# Patient Record
Sex: Female | Born: 1945 | Race: White | Hispanic: No | Smoking: Former smoker
Health system: Southern US, Community
[De-identification: ages and names within clinical notes are randomized; demographics above are authoritative.]

## PROBLEM LIST (undated history)

## (undated) DIAGNOSIS — E559 Vitamin D deficiency, unspecified: Secondary | ICD-10-CM

## (undated) DIAGNOSIS — M1990 Unspecified osteoarthritis, unspecified site: Secondary | ICD-10-CM

## (undated) DIAGNOSIS — I8390 Asymptomatic varicose veins of unspecified lower extremity: Secondary | ICD-10-CM

## (undated) HISTORY — PX: HIP SURGERY: SHX245

## (undated) HISTORY — DX: Vitamin D deficiency, unspecified: E55.9

## (undated) HISTORY — PX: JOINT REPLACEMENT: SHX530

## (undated) SURGERY — HEMIARTHROPLASTY, HIP, DIRECT ANTERIOR APPROACH, FOR FRACTURE
Anesthesia: Choice | Laterality: Right

---

## 2008-08-06 ENCOUNTER — Emergency Department (HOSPITAL_BASED_OUTPATIENT_CLINIC_OR_DEPARTMENT_OTHER): Admission: EM | Admit: 2008-08-06 | Discharge: 2008-08-06 | Payer: Self-pay | Admitting: Emergency Medicine

## 2008-10-01 ENCOUNTER — Inpatient Hospital Stay (HOSPITAL_COMMUNITY): Admission: EM | Admit: 2008-10-01 | Discharge: 2008-10-12 | Payer: Self-pay | Admitting: Emergency Medicine

## 2008-10-01 ENCOUNTER — Encounter: Payer: Self-pay | Admitting: Emergency Medicine

## 2009-10-18 ENCOUNTER — Ambulatory Visit: Payer: Self-pay | Admitting: Physician Assistant

## 2009-10-18 DIAGNOSIS — L723 Sebaceous cyst: Secondary | ICD-10-CM | POA: Insufficient documentation

## 2009-10-18 DIAGNOSIS — R0989 Other specified symptoms and signs involving the circulatory and respiratory systems: Secondary | ICD-10-CM | POA: Insufficient documentation

## 2009-10-21 DIAGNOSIS — E559 Vitamin D deficiency, unspecified: Secondary | ICD-10-CM | POA: Insufficient documentation

## 2009-10-21 LAB — CONVERTED CEMR LAB
Alkaline Phosphatase: 94 units/L (ref 39–117)
Amphetamine Screen, Ur: NEGATIVE
Barbiturate Quant, Ur: NEGATIVE
Benzodiazepines.: NEGATIVE
CO2: 23 meq/L (ref 19–32)
Calcium: 9.9 mg/dL (ref 8.4–10.5)
Cocaine Metabolites: NEGATIVE
Creatinine,U: 25.6 mg/dL
Glucose, Bld: 87 mg/dL (ref 70–99)
Marijuana Metabolite: NEGATIVE
Methadone: NEGATIVE
Platelets: 273 10*3/uL (ref 150–400)
Potassium: 4.8 meq/L (ref 3.5–5.3)
Propoxyphene: NEGATIVE
Sodium: 140 meq/L (ref 135–145)
Total Bilirubin: 0.3 mg/dL (ref 0.3–1.2)
WBC: 4.7 10*3/uL (ref 4.0–10.5)

## 2009-11-29 ENCOUNTER — Ambulatory Visit: Payer: Self-pay | Admitting: Physician Assistant

## 2009-11-29 DIAGNOSIS — M2550 Pain in unspecified joint: Secondary | ICD-10-CM | POA: Insufficient documentation

## 2009-11-29 DIAGNOSIS — R799 Abnormal finding of blood chemistry, unspecified: Secondary | ICD-10-CM | POA: Insufficient documentation

## 2009-11-29 LAB — CONVERTED CEMR LAB
Glucose, Urine, Semiquant: NEGATIVE
KOH Prep: NEGATIVE
Nitrite: NEGATIVE
OCCULT 1: NEGATIVE
Pap Smear: NEGATIVE
WBC Urine, dipstick: NEGATIVE
Whiff Test: NEGATIVE

## 2009-12-03 ENCOUNTER — Encounter: Payer: Self-pay | Admitting: Physician Assistant

## 2009-12-03 LAB — CONVERTED CEMR LAB
Cholesterol, target level: 200 mg/dL
Cholesterol: 194 mg/dL (ref 0–200)
GC Probe Amp, Genital: NEGATIVE
LDL Cholesterol: 125 mg/dL — ABNORMAL HIGH (ref 0–99)
Rhuematoid fact SerPl-aCnc: <20 intl units/mL (ref 0–20)
Sed Rate: 13 mm/hr (ref 0–22)
Total CHOL/HDL Ratio: 3.7
Triglycerides: 86 mg/dL (ref <150)
VLDL: 17 mg/dL (ref 0–40)

## 2009-12-20 ENCOUNTER — Encounter: Payer: Self-pay | Admitting: Physician Assistant

## 2010-01-10 ENCOUNTER — Ambulatory Visit (HOSPITAL_COMMUNITY): Admission: RE | Admit: 2010-01-10 | Discharge: 2010-01-10 | Payer: Self-pay | Admitting: General Surgery

## 2010-02-14 ENCOUNTER — Ambulatory Visit (HOSPITAL_COMMUNITY): Admission: RE | Admit: 2010-02-14 | Discharge: 2010-02-14 | Payer: Self-pay | Admitting: General Surgery

## 2010-03-07 ENCOUNTER — Encounter: Payer: Self-pay | Admitting: Physician Assistant

## 2010-03-21 ENCOUNTER — Encounter: Payer: Self-pay | Admitting: Physician Assistant

## 2010-11-23 ENCOUNTER — Encounter: Payer: Self-pay | Admitting: Internal Medicine
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
## 2010-12-02 NOTE — Letter (Signed)
Summary: SURGEON NOTES  SURGEON NOTES   Imported By: Arta Bruce 04/09/2010 09:34:23  _____________________________________________________________________  External Attachment:    Type:   Image     Comment:   External Document

## 2010-12-02 NOTE — Letter (Signed)
Summary: pt information sheet  pt information sheet   Imported By: Arta Bruce 12/09/2009 15:44:29  _____________________________________________________________________  External Attachment:    Type:   Image     Comment:   External Document
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
## 2010-12-02 NOTE — Letter (Signed)
Summary: SLIDING SCALE FEE  SLIDING SCALE FEE   Imported By: Arta Bruce 12/09/2009 16:02:44  _____________________________________________________________________  External Attachment:    Type:   Image     Comment:   External Document

## 2010-12-02 NOTE — Letter (Signed)
Summary: SURGEON NOTES  SURGEON NOTES   Imported By: Arta Bruce 03/20/2010 15:13:08  _____________________________________________________________________  External Attachment:    Type:   Image     Comment:   External Document

## 2010-12-02 NOTE — Progress Notes (Signed)
Summary: Office Visit//DEPRESSION SCREENING  Office Visit//DEPRESSION SCREENING   Imported By: Arta Bruce 01/22/2010 12:48:43  _____________________________________________________________________  External Attachment:    Type:   Image     Comment:   External Document

## 2010-12-02 NOTE — Letter (Signed)
Summary: *HSN Results Follow up  [facility]  [address] [place], [place] [address]   Phone: [phone]  Fax: [phone]      12/03/2009   TONIANN DICKERSON [address] [place], [place]  [address]   Dear  Ms. Gaytha Godek,                            ____S.Drinkard,FNP   ____D. Gore,FNP       ____B. McPherson,MD   ____V. Rankins,MD    ____E. Mulberry,MD    ____N. Daphine Deutscher, FNP  ____D. Reche Dixon, MD    ____K. Philipp Deputy, MD    __x__S. Alben Spittle, PA-C    This letter is to inform you that your recent test(s):  __x_____Pap Smear    ___x____Lab Test     _______X-ray   ___x____ is within acceptable limits  _______ requires a medication change  _______ requires a follow-up lab visit  _______ requires a follow-up visit with your provider   Comments:  No signs of rheumatoid arthritis.  Your cholesterol levels are ok.  But, I need you to get the ultrasound done on your neck.  If it does show some plaque in your artery, then we would want to be more aggressive with your cholesterol to get it lower.  We would put you on a medication to help in order to reduce your risk of stroke.  If the ultrasound looks good, then there is nothing else we need to do.  If you don't already, start taking a baby aspirin (81 mg) daily (unless you cannot take it due to allergy or history of bleeding ulcers, etc.).     _________________________________________________________ If you have any questions, please contact our office                     Sincerely,  Tereso Newcomer PA-C [facility]

## 2011-01-20 LAB — CBC
Platelets: 210 10*3/uL (ref 150–400)
RBC: 3.62 MIL/uL — ABNORMAL LOW (ref 3.87–5.11)
WBC: 3.9 10*3/uL — ABNORMAL LOW (ref 4.0–10.5)

## 2011-02-23 ENCOUNTER — Emergency Department (HOSPITAL_COMMUNITY)
Admission: EM | Admit: 2011-02-23 | Discharge: 2011-02-23 | Disposition: A | Discharge status: Home / Self Care | Payer: Self-pay | Attending: Emergency Medicine | Admitting: Emergency Medicine

## 2011-02-23 DIAGNOSIS — IMO0002 Reserved for concepts with insufficient information to code with codable children: Secondary | ICD-10-CM | POA: Insufficient documentation

## 2011-02-23 DIAGNOSIS — T169XXA Foreign body in ear, unspecified ear, initial encounter: Secondary | ICD-10-CM | POA: Insufficient documentation
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
## 2011-08-04 LAB — CBC
MCHC: 32.2
MCV: 105.3 — ABNORMAL HIGH
RBC: 3.8 — ABNORMAL LOW
RDW: 13.9

## 2011-08-04 LAB — BASIC METABOLIC PANEL
Calcium: 9
Chloride: 94 — ABNORMAL LOW
Creatinine, Ser: 0.5
GFR calc Af Amer: >60
Glucose, Bld: 102 — ABNORMAL HIGH
Potassium: 3.8
Sodium: 131 — ABNORMAL LOW

## 2011-08-04 LAB — DIFFERENTIAL
Basophils Absolute: 0
Eosinophils Relative: 4
Lymphocytes Relative: 26
Lymphs Abs: 1.3
Monocytes Absolute: 0.5
Neutro Abs: 3

## 2011-08-04 LAB — URINE CULTURE: Colony Count: NO GROWTH

## 2011-08-04 LAB — URINALYSIS, ROUTINE W REFLEX MICROSCOPIC
Bilirubin Urine: NEGATIVE
Glucose, UA: NEGATIVE
Ketones, ur: NEGATIVE
Specific Gravity, Urine: 1.017
pH: 5.5

## 2011-08-07 LAB — PROTIME-INR
INR: 1.6 — ABNORMAL HIGH (ref 0.00–1.49)
INR: 2 — ABNORMAL HIGH (ref 0.00–1.49)
INR: 2.4 — ABNORMAL HIGH (ref 0.00–1.49)
INR: 3 — ABNORMAL HIGH (ref 0.00–1.49)
Prothrombin Time: 16 seconds — ABNORMAL HIGH (ref 11.6–15.2)
Prothrombin Time: 20.1 seconds — ABNORMAL HIGH (ref 11.6–15.2)
Prothrombin Time: 23.6 seconds — ABNORMAL HIGH (ref 11.6–15.2)
Prothrombin Time: 33.5 seconds — ABNORMAL HIGH (ref 11.6–15.2)

## 2011-08-07 LAB — CBC
HCT: 27.2 % — ABNORMAL LOW (ref 36.0–46.0)
HCT: 29.5 % — ABNORMAL LOW (ref 36.0–46.0)
HCT: 34.1 % — ABNORMAL LOW (ref 36.0–46.0)
Hemoglobin: 10.1 g/dL — ABNORMAL LOW (ref 12.0–15.0)
Hemoglobin: 11.9 g/dL — ABNORMAL LOW (ref 12.0–15.0)
Hemoglobin: 13.7 g/dL (ref 12.0–15.0)
Hemoglobin: 9.1 g/dL — ABNORMAL LOW (ref 12.0–15.0)
MCHC: 34.2 g/dL (ref 30.0–36.0)
MCHC: 34.3 g/dL (ref 30.0–36.0)
MCHC: 34.8 g/dL (ref 30.0–36.0)
MCHC: 35 g/dL (ref 30.0–36.0)
MCV: 106.3 fL — ABNORMAL HIGH (ref 78.0–100.0)
MCV: 106.8 fL — ABNORMAL HIGH (ref 78.0–100.0)
MCV: 107.1 fL — ABNORMAL HIGH (ref 78.0–100.0)
MCV: 107.5 fL — ABNORMAL HIGH (ref 78.0–100.0)
Platelets: 152 10*3/uL (ref 150–400)
RBC: 2.76 MIL/uL — ABNORMAL LOW (ref 3.87–5.11)
RBC: 3.76 MIL/uL — ABNORMAL LOW (ref 3.87–5.11)
RDW: 14.6 % (ref 11.5–15.5)
RDW: 14.8 % (ref 11.5–15.5)
WBC: 7.3 10*3/uL (ref 4.0–10.5)
WBC: 9.6 10*3/uL (ref 4.0–10.5)

## 2011-08-07 LAB — BASIC METABOLIC PANEL
BUN: 4 mg/dL — ABNORMAL LOW (ref 6–23)
BUN: 6 mg/dL (ref 6–23)
CO2: 28 mEq/L (ref 19–32)
CO2: 30 mEq/L (ref 19–32)
Calcium: 8.3 mg/dL — ABNORMAL LOW (ref 8.4–10.5)
Chloride: 97 mEq/L (ref 96–112)
Creatinine, Ser: 0.32 mg/dL — ABNORMAL LOW (ref 0.4–1.2)
Creatinine, Ser: 0.42 mg/dL (ref 0.4–1.2)
GFR calc non Af Amer: >60 mL/min (ref >60)
GFR calc non Af Amer: >60 mL/min (ref >60)
Glucose, Bld: 107 mg/dL — ABNORMAL HIGH (ref 70–99)
Potassium: 3.6 mEq/L (ref 3.5–5.1)
Sodium: 133 mEq/L — ABNORMAL LOW (ref 135–145)
Sodium: 137 mEq/L (ref 135–145)

## 2012-01-23 ENCOUNTER — Encounter (HOSPITAL_BASED_OUTPATIENT_CLINIC_OR_DEPARTMENT_OTHER): Payer: Self-pay | Admitting: *Deleted

## 2012-01-23 ENCOUNTER — Emergency Department (HOSPITAL_BASED_OUTPATIENT_CLINIC_OR_DEPARTMENT_OTHER)
Admission: EM | Admit: 2012-01-23 | Discharge: 2012-01-23 | Disposition: A | Discharge status: Home / Self Care | Payer: Self-pay | Attending: Emergency Medicine | Admitting: Emergency Medicine

## 2012-01-23 ENCOUNTER — Emergency Department (INDEPENDENT_AMBULATORY_CARE_PROVIDER_SITE_OTHER): Payer: Self-pay

## 2012-01-23 DIAGNOSIS — M19079 Primary osteoarthritis, unspecified ankle and foot: Secondary | ICD-10-CM | POA: Insufficient documentation

## 2012-01-23 DIAGNOSIS — M25569 Pain in unspecified knee: Secondary | ICD-10-CM

## 2012-01-23 DIAGNOSIS — M1990 Unspecified osteoarthritis, unspecified site: Secondary | ICD-10-CM

## 2012-01-23 DIAGNOSIS — M25473 Effusion, unspecified ankle: Secondary | ICD-10-CM | POA: Insufficient documentation

## 2012-01-23 DIAGNOSIS — M25476 Effusion, unspecified foot: Secondary | ICD-10-CM | POA: Insufficient documentation

## 2012-01-23 DIAGNOSIS — R609 Edema, unspecified: Secondary | ICD-10-CM | POA: Insufficient documentation

## 2012-01-23 DIAGNOSIS — M7989 Other specified soft tissue disorders: Secondary | ICD-10-CM | POA: Insufficient documentation

## 2012-01-23 MED ORDER — OXYCODONE-ACETAMINOPHEN 5-325 MG PO TABS: 2.0000 | ORAL_TABLET | ORAL | Status: AC | PRN

## 2012-01-23 MED ORDER — NAPROXEN 500 MG PO TABS: 500.0000 mg | ORAL_TABLET | Freq: Two times a day (BID) | ORAL | Status: AC
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
## 2012-01-23 NOTE — Discharge Instructions (Signed)
Degenerative Arthritis  You have osteoarthritis. This is the wear and tear arthritis that comes with aging. It is also called degenerative arthritis. This is common in people past middle age. It is caused by stress on the joints. The large weight bearing joints of the lower extremities are most often affected. The knees, hips, back, neck, and hands can become painful, swollen, and stiff. This is the most common type of arthritis. It comes on with age, carrying too much weight, or from an injury.  Treatment includes resting the sore joint until the pain and swelling improve. Crutches or a walker may be needed for severe flares. Only take over-the-counter or prescription medicines for pain, discomfort, or fever as directed by your caregiver. Local heat therapy may improve motion. Cortisone shots into the joint are sometimes used to reduce pain and swelling during flares.  Osteoarthritis is usually not crippling and progresses slowly. There are things you can do to decrease pain:  · Avoid high impact activities.  · Exercise regularly.  · Low impact exercises such as walking, biking and swimming help to keep the muscles strong and keep normal joint function.  · Stretching helps to keep your range of motion.  · Lose weight if you are overweight. This reduces joint stress.  In severe cases when you have pain at rest or increasing disability, joint surgery may be helpful. See your caregiver for follow-up treatment as recommended.   SEEK IMMEDIATE MEDICAL CARE IF:   · You have severe joint pain.  · Marked swelling and redness in your joint develops.  · You develop a high fever.  Document Released: 10/19/2005 Document Revised: 10/08/2011 Document Reviewed: 03/21/2007  [employer]® Patient Information ©2012 [employer].

## 2012-01-23 NOTE — ED Notes (Signed)
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 Walking with walker. No swelling per pt. No known injury.

## 2013-11-17 ENCOUNTER — Encounter (HOSPITAL_COMMUNITY): Payer: Self-pay | Admitting: Emergency Medicine

## 2013-11-17 ENCOUNTER — Emergency Department (HOSPITAL_COMMUNITY)
Admission: EM | Admit: 2013-11-17 | Discharge: 2013-11-17 | Disposition: A | Discharge status: Home / Self Care | Payer: Medicare Other | Attending: Emergency Medicine | Admitting: Emergency Medicine

## 2013-11-17 DIAGNOSIS — Z8739 Personal history of other diseases of the musculoskeletal system and connective tissue: Secondary | ICD-10-CM | POA: Insufficient documentation

## 2013-11-17 DIAGNOSIS — F17200 Nicotine dependence, unspecified, uncomplicated: Secondary | ICD-10-CM | POA: Insufficient documentation

## 2013-11-17 DIAGNOSIS — I83899 Varicose veins of unspecified lower extremities with other complications: Secondary | ICD-10-CM

## 2013-11-17 DIAGNOSIS — I8390 Asymptomatic varicose veins of unspecified lower extremity: Secondary | ICD-10-CM | POA: Insufficient documentation

## 2013-11-17 HISTORY — DX: Unspecified osteoarthritis, unspecified site: M19.90

## 2013-11-17 HISTORY — DX: Asymptomatic varicose veins of unspecified lower extremity: I83.90

## 2013-11-17 NOTE — Discharge Instructions (Signed)
Keep your wound clean and dry.  If you have any more bleeding, elevate your leg and hold pressure for 15-59min.      Bleeding Varicose Veins Varicose veins are veins that have become enlarged and twisted. Valves in the veins help return blood from the leg to the heart. If these valves are damaged, blood flows backwards and backs up into the veins in the leg near the skin. This causes the veins to become larger because of increased pressure within. Sometimes these veins bleed. CAUSES  Factors that can lead to bleeding varicose veins include:  Thinning of the skin that covers the veins. This skin is stretched as the veins enlarge.  Weak and thinning walls of the varicose veins. These thin walls are part of the reason why blood is not flowing normally to the heart.  Having high pressure in the veins. This high pressure occurs because the blood is not flowing freely back up to the heart.  Injury. Even a small injury to a varicose vein can cause bleeding.  Open wounds. A sore may develop near a varicose vein and not heal. This makes bleeding more likely.  Taking medicine that thins the blood. These medicines may include aspirin, anti-inflammatory medicine, and other blood thinners. SYMPTOMS  If bleeding is on the outside surface of the skin, blood can be seen. Sometimes, the bleeding stays under the skin. If this happens, the blue or purple area will spread beyond the vein. This discoloration may be visible. DIAGNOSIS  To decide if you have a bleeding varicose vein, your caregiver may:  Ask about your symptoms. This will include when you first saw bleeding.  Ask about how long you have had varicose veins and if they cause you problems.  Ask about your overall health.  Ask about possible causes, like recent cuts or if the area near the varicose veins was bumped or injured.  Examine the skin or leg that concerns you. Your caregiver will probably feel the veins.  Order imaging tests. These  create detailed pictures of the veins. TREATMENT  The first goal of treating bleeding varicose veins is to stop the bleeding. Then, the aim is to keep any bleeding from happening again. Treatment will depend on the cause of the bleeding and how bad it is. Ask your caregiver about what would be best for you. Options include:  Raising (elevating) your leg. Lie down with your leg propped up on a pillow or cushion. Your foot should be above your heart.  Applying pressure to the spot that is bleeding. The bleeding should stop in a short time.  Wearing elastic stocking that "compress" your legs (compression stockings). An elastic bandage may do the same thing.  Applying an antibiotic cream on sores that are not healing.  Surgically removing or closing off the bleeding varicose veins. HOME CARE INSTRUCTIONS   Apply any creams that your caregiver prescribed. Follow the directions carefully.  Wear compression stockings or any special wraps that were prescribed. Make sure you know:  If you should wear them every day.  How long you should wear them.  If veins were removed or closed, a bandage (dressing) will probably cover the area. Make sure you know:  How often the dressing should be changed.  Whether the area can get wet.  When you can leave the skin uncovered.  Check your skin every day. Look for new sores and signs of bleeding.  To prevent future bleeding:  Use extra care in situations where you  could cut your legs. Shaving, for example, or working outside in the garden.  Try to keep your legs elevated as much as possible. Lie down when you can. SEEK MEDICAL CARE IF:   You have any questions about how to wear compression stockings or elastic bandages.  Your veins continue to bleed.  Sores develop near your varicose veins.  You have a sore that does not heal or gets bigger.  Pain increases in your leg.  The area around a varicose vein becomes warm, red, or tender to the  touch.  You notice a yellowish fluid that smells bad coming from a spot where there was bleeding.  You develop a fever of more than 100.5 F (38.1 C). SEEK IMMEDIATE MEDICAL CARE IF:   You develop a fever of more than 102 F (38.9 C). Document Released: 03/07/2009 Document Revised: 01/11/2012 Document Reviewed: 12/15/2010 [facility] Patient Information 2014 [place]ExitCare, [place].

## 2013-11-17 NOTE — ED Provider Notes (Signed)
CSN: [id number]     Arrival date & time 11/17/13  0019 History   First MD Initiated Contact with Patient 11/17/13 0044     Chief Complaint  Patient presents with  . Leg Injury    spontaneous bleeding with hx vericose veins, started bleeding at @1830    HPI  History provided by the patient and family. Patient is a 68 year old female with history of verrucous veins and arthritis who presents with bleeding from a varicose vein or right lower leg. Symptoms began earlier in the day. She was able to stop some bleeding temporarily but later when she was up standing and walking she began having bleeding again. She did use bandaging and a tight wrap to finally stop bleeding. She has also been elevating her legs to help reduce bleeding.  She is concerned that the bleeding may return and continue. She does have history of similar problems in the past. Denies any other aggravating or alleviating factors denies any pain. No other associated symptoms. She is not on any blood thinners or aspirin.   Past Medical History  Diagnosis Date  . Vein, varicose   . Arthritis    Past Surgical History  Procedure Laterality Date  . Hip surgery     History reviewed. No pertinent family history. History  Substance Use Topics  . Smoking status: Current Every Day Smoker  . Smokeless tobacco: Not on file  . Alcohol Use: Yes     Comment: intermittent, unable to quantify   OB History   Grav Para Term Preterm Abortions TAB SAB Ect Mult Living                 Review of Systems  All other systems reviewed and are negative.    Allergies  Review of patient's allergies indicates no known allergies.  Home Medications   Current Outpatient Rx  Name  Route  Sig  Dispense  Refill  . ranitidine (ZANTAC) 75 MG tablet   Oral   Take 75 mg by mouth every 4 (four) hours as needed. For indigestion          BP 183/96  Pulse 91  Temp(Src) 97.7 F (36.5 C) (Oral)  Resp 20  Ht 5\' 8"  (1.727 m)  Wt 115 lb (52.164  kg)  BMI 17.49 kg/m2  SpO2 95% Physical Exam  Nursing note and vitals reviewed. Constitutional: She is oriented to person, place, and time. She appears well-developed and well-nourished. No distress.  HENT:  Head: Normocephalic.  Eyes: Conjunctivae are normal.  Cardiovascular: Normal rate and regular rhythm.   Pulmonary/Chest: Effort normal and breath sounds normal.  Neurological: She is alert and oriented to person, place, and time.  Skin: Skin is warm and dry. No rash noted.  Optical varicose veins to bilateral lower extremities. There is swelling of the lower extremities.  Small area with dried blood in clots to the anterior lower leg. Bleeding controlled.  Psychiatric: She has a normal mood and affect. Her behavior is normal.    ED Course  Procedures   DIAGNOSTIC STUDIES: Oxygen Saturation is 95% on room air.    COORDINATION OF CARE:  Nursing notes reviewed. Vital signs reviewed. Initial pt interview and examination performed.   12:59 AM-patient seen and evaluated. The patient well-appearing no acute distress. No bleeding. Does not use blood thinners or aspirin. Discussed treatment plan with pt at bedside, which includes cleaning and care upon closure. Pt agrees with plan.  LACERATION REPAIR Performed by: Angus Seller Authorized by: Ivonne Andrew  S Consent: Verbal consent obtained. Risks and benefits: risks, benefits and alternatives were discussed Consent given by: patient Patient identity confirmed: provided demographic data Prepped and Draped in normal sterile fashion Wound explored  Laceration Location: Right lower anterior leg  Laceration Length: 3 mm  No Foreign Bodies seen or palpated  Anesthesia: None   Irrigation method: syringe Amount of cleaning: standard  Skin closure: Dermabond   Patient tolerance: Patient tolerated the procedure well with no immediate complications.      MDM   1. Bleeding from varicose vein        Angus Sellereter S Maijor Hornig,  PA-C 11/17/13 [phone]

## 2013-11-17 NOTE — ED Notes (Addendum)
Patient states her leg spontaneously started bleeding @1830  tonight. Patient states when she keeps her leg elevated it does not bleed. @6  months ago patient had same occurence, was told it was a varicose vein bleeding. Bleeding controlled with bandage from home at this time.

## 2013-11-17 NOTE — ED Notes (Signed)
Peter, PA at bedside.

## 2013-11-17 NOTE — ED Provider Notes (Signed)
Medical screening examination/treatment/procedure(s) were performed by non-physician practitioner and as supervising physician I was immediately available for consultation/collaboration.  EKG Interpretation   None         Zaelyn Barbary, MD 11/17/13 0631 

## 2013-11-23 ENCOUNTER — Emergency Department (HOSPITAL_COMMUNITY)
Admission: EM | Admit: 2013-11-23 | Discharge: 2013-11-23 | Disposition: A | Discharge status: Home / Self Care | Payer: Medicare Other | Attending: Emergency Medicine | Admitting: Emergency Medicine

## 2013-11-23 ENCOUNTER — Encounter (HOSPITAL_COMMUNITY): Payer: Self-pay | Admitting: Emergency Medicine

## 2013-11-23 DIAGNOSIS — M7989 Other specified soft tissue disorders: Secondary | ICD-10-CM | POA: Insufficient documentation

## 2013-11-23 DIAGNOSIS — F17200 Nicotine dependence, unspecified, uncomplicated: Secondary | ICD-10-CM | POA: Insufficient documentation

## 2013-11-23 DIAGNOSIS — I8390 Asymptomatic varicose veins of unspecified lower extremity: Secondary | ICD-10-CM

## 2013-11-23 DIAGNOSIS — I868 Varicose veins of other specified sites: Secondary | ICD-10-CM | POA: Insufficient documentation

## 2013-11-23 DIAGNOSIS — Z8739 Personal history of other diseases of the musculoskeletal system and connective tissue: Secondary | ICD-10-CM | POA: Insufficient documentation

## 2013-11-23 NOTE — ED Notes (Signed)
Pt states was treated last week for ruptured varicose vein; states bandage is too tight; bandage in place x 1 wk; pt scared to remove bandage due to risk of bleeding; tight clean/dry bandage noted with swelling around bandage

## 2013-11-23 NOTE — ED Notes (Signed)
Proper wound care reviewed with pt. Supplies given

## 2013-11-23 NOTE — Discharge Instructions (Signed)
Please read and follow all provided instructions.  Your diagnoses today include:  1. Varicose vein of leg    Tests performed today include:  Vital signs. See below for your results today.   Medications prescribed:   None  Home care instructions:  Follow any educational materials contained in this packet.  If the area starts to bleed, apply direct pressure and elevate leg. If bleeding is severe or does not improve after 30 minutes, please come to the Emergency Department.   Follow-up instructions: Please follow-up with your primary care provider as needed for further evaluation of your symptoms.  If you do not have a primary care doctor -- see below for referral information.   Return instructions:   Please return to the Emergency Department if you experience worsening symptoms.   Please return if you have any other emergent concerns.  Additional Information:  Your vital signs today were: BP 141/90   Pulse 85   Temp(Src) 98.4 F (36.9 C)   Resp 20   SpO2 96% If your blood pressure (BP) was elevated above 135/85 this visit, please have this repeated by your doctor within one month. ---------------  Emergency Department Resource Guide 1) Find a Doctor and Pay Out of Pocket Although you won't have to find out who is covered by your insurance plan, it is a good idea to ask around and get recommendations. You will then need to call the office and see if the doctor you have chosen will accept you as a new patient and what types of options they offer for patients who are self-pay. Some doctors offer discounts or will set up payment plans for their patients who do not have insurance, but you will need to ask so you aren't surprised when you get to your appointment.  2) Contact Your Local Health Department Not all health departments have doctors that can see patients for sick visits, but many do, so it is worth a call to see if yours does. If you don't know where your local health  department is, you can check in your phone book. The CDC also has a tool to help you locate your state's health department, and many state websites also have listings of all of their local health departments.  3) Find a Walk-in Clinic If your illness is not likely to be very severe or complicated, you may want to try a walk in clinic. These are popping up all over the country in pharmacies, drugstores, and shopping centers. They're usually staffed by nurse practitioners or physician assistants that have been trained to treat common illnesses and complaints. They're usually fairly quick and inexpensive. However, if you have serious medical issues or chronic medical problems, these are probably not your best option.  No Primary Care Doctor: - Call Health Connect at  [phone] - they can help you locate a primary care doctor that  accepts your insurance, provides certain services, etc. - Physician Referral Service- [phone]  Chronic Pain Problems: Organization         Address  Phone   Notes  [facility]  [phone] Patients need to be referred by their primary care doctor.   Medication Assistance: Organization         Address  Phone   Notes  [facility] Medication [facility] [address] [place] [place], [place] [address] [phone] --Must be a resident of [facility] -- Must have NO insurance coverage whatsoever (no Medicaid/ Medicare, etc.) --  The pt. MUST have a primary care doctor that directs their care regularly and follows them in the community   [facility]  [phone]   [employer]  [phone]    Agencies that provide inexpensive medical care: Organization         Address  Phone   Notes  [facility]  [phone]   [facility]    [phone]   [facility] [address] [place], [place] [address] [phone]   [facility] [address] [place] [place] [phone]   Planned Parenthood    [phone]   [facility]    [phone]   [facility] and [facility]  [address], [place] Phone:  [phone], Fax:  [phone] Hours of Operation:  9 am - 6 pm, M-F.  Also accepts Medicaid/Medicare and self-pay.  [facility]  [address], [place] Phone: [phone], Fax: [phone]. Hours of Operation:  8:30 am - 5:30 pm, M-F.  Also accepts Medicaid and self-pay.  [facility] [place] [address] [place] Phone: [phone]   [facility] [address] Natasha Bence [place], [place] [phone], Ext. [phone] Mondays & Thursdays: 7-9 AM.  First 15 patients are seen on a first come, first serve basis.    Medicaid-accepting [facility] Providers:  Organization         Address  Phone   Notes  [facility] [address], [place] [phone] Also accepts self-pay patients.  [facility] [address] Laurell Josephs [place], [place]  [phone]   [facility] [address], [place] [phone]   [facility] [address], [place] [phone]   Renaye Rakers [address], [place]   [phone] Only accepts [place] Access [place] patients after they have their name applied to their card.   Self-Pay (no insurance) in [facility]:  Organization         Address  Phone   Notes  Sickle Cell Patients, [facility] Medicine [address] [place], [place] [phone]   [facility] [address] [place], [place] [phone]   [facility] [place]  [address], [place] [phone]   [facility]/Dr. Osei-Bonsu  [address] [place] or [address], [place] [phone] Phone number for both [place] and  [place] locations is the same.  Urgent Medical and [facility] [address] [place] [phone]   [facility] [address], [place] or [address] [phone]   [facility] [address], [place] [phone], phone; [phone], fax Sees patients 1st and 3rd Saturday of every month.  Must not qualify for public or private insurance (i.e. Medicaid, Medicare, [place] Health Choice, Veterans' Benefits)  Household income should be no more than 200% of the poverty level The clinic cannot treat you if you are pregnant or think you are pregnant  Sexually transmitted diseases are not treated at the clinic.    Dental Care: Organization         Address  Phone  Notes  [facility] of Public Health [facility] [phone]  [address] [place], [place] [phone]) [phone] Accepts children up to age 66 who are enrolled in [place] or [place] Health Choice; pregnant women with a Medicaid card; and children who have applied for Medicaid or [place] Health Choice, but were declined, whose parents can pay a reduced fee at time of service.  [facility] [place]  [address], [place] [phone] Accepts children up to age 66 who are enrolled in [place] or [place] Health Choice; pregnant women with a Medicaid card; and children who have applied for Medicaid or [place] Health Choice, but were declined, whose parents can pay a reduced fee at time of service.  [facility]  [address], [place] [phone]) [phone] Patients are seen by appointment only. Walk-ins are not accepted. [facility] will see patients 66 years of age and older. Monday - Tuesday (8am-5pm) Most Wednesdays (8:30-5pm) $30 per visit, cash only  [facility]  [address], [facility] [phone]) [phone] Patients are seen by appointment only. Walk-ins are not accepted.  [facility] will see patients 66 years of age and older. One Wednesday Evening (Monthly: Volunteer Based).  $30 per visit, cash only  [employer] [facility] [facility]  [phone]) [phone] for adults; Children under age 264, call Graduate Pediatric Dentistry at [phone]) [phone]. Children aged 54-14, please call [phone]) [phone] to request a pediatric application.  Dental services are provided in all areas of dental care including fillings, crowns and bridges, complete and partial dentures, implants, gum treatment, root canals, and extractions. Preventive care is also provided. Treatment is provided to both adults and children. Patients are selected via a lottery and there is often a waiting list.   [facility] [address], [place]  [phone]) [phone] [email or web address]   [facility] [address], [place], [place] [phone], Ext. [phone] Second and Fourth Thursday of each month, opens at 6:30 AM; Clinic ends at 9 AM.  Patients are seen on a first-come first-served basis, and a limited number are seen during each clinic.   [facility]  [address], [place], [place] [phone]) [phone]   Eligibility Requirements You must have lived in [place], [place], or [place] counties for at least the last three months.   You cannot be eligible for state or federal sponsored [place] Cityhealthcare insurance, including CIGNAVeterans Administration, [place]icaid, or [employer]icare.   You generally cannot be eligible for healthcare insurance through your employer.    How to apply: Eligibility screenings are held every Tuesday and Wednesday afternoon from 1:00 pm until 4:00 pm. You do not need an appointment for the interview!  [facility] [address], [place], [place] [phone]   [facility]  [phone]   [facility]  [phone]   [facility]  [phone]    Behavioral Health Resources in the  Community: Intensive Outpatient Programs Organization         Address  Phone  Notes  [facility] [address], [place], [place] [phone]   [facility] [address], [place], [place] [phone]   [facility] [address], [place], [place]  [phone]   [facility] [address],  [place], [place] [phone] or [phone]   Substance Abuse Resources Organization         Address  Phone  Notes  Alcohol  and Drug Services  [phone]   [facility]  [phone]   [facility] [place]  [phone]   Floydene Flock  [phone]   Residential & Outpatient Substance Abuse Program  [phone]   Psychological Services Organization         Address  Phone  Notes  [facility] Behavior[facility] Health  [phone]   [facility]  [phone]   [facility] Mental [facility] [address] [place] [phone] or [phone]    Mobile Crisis Teams Organization         Address  Phone  Notes  [facility], Mobile Crisis Care Unit  [phone]   [facility]  [address] [place], [place] [phone]   Doristine Locks [address] [place] [phone]    Self-Help/Support Groups Organization         Address  Phone             Notes  [facility] [place] - variety of support groups  336- I7437963 Call for more information  Narcotics Anonymous (NA), [facility]aring [facility] [address], [employer] [place]  2 meetings at this location   Statistician         Address  Phone  Notes  [facility] [address],    [place]  [phone]   [facility]  [address] [place] [address], [place], [place] [phone]   [facility] [address] [place], [place] [phone] Admissions: 8am-3pm M-F  [facility] [address],    [place], [place] [phone]   [facility] [address] [place], [place], [place] [phone]   [facility] [address],  [place], [place] [phone]   [facility] - Intensive Outpatient [address]ure[address] Joseph[address], [place], [place] [phone]   [facility] ([facility] [address] [place]  [place], [place] [phone] or [phone]   [facility] ([facility]) [address] [place], [place] [phone] Accepts Medicaid  [facility] [address]  [place] [phone] Substance Abuse/Addiction Treatment   [facility] Organization         Address  Phone  Notes  [facility]  [phone]   Angie Fava, PhD [address] Ervin Knack [place], [place]   [phone] or [phone]   [facility]   [address] [place], [place] [phone]   [facility] [address] [place], [place] [phone] Insurance/Medicaid/sponsorship through [facility] and Families [address]                                    [place], [place] [phone] Therapy/tele-psych/case  [facility] [address], [place] [phone]    Dr. Lolly Mustache  [phone]   [facility]  [employer] [facility] Dept. 1) [address], [place] 2) [address] [place] 3)  [address], [place] [phone]  [phone]   [facility] [place] Child Abuse Hotline 978-353-6175 or [phone] (After Hours)

## 2013-11-23 NOTE — ED Provider Notes (Signed)
CSN: [id number]     Arrival date & time 11/23/13  1051 History   First MD Initiated Contact with Patient 11/23/13 1112     Chief Complaint  Patient presents with  . Varicose Veins   (Consider location/radiation/quality/duration/timing/severity/associated sxs/prior Treatment) HPI Comments: Patient with history of varicose veins of legs, no blood thinners, was seen in emergency department 6 days ago for bleeding varicosity of right lower extremity. At that time she had a small amount of Dermabond applied to the area as well as a pressure bandage. She has not removed the bandage since her ED visit. She returns today because she's afraid to take the bandage off at home because the bleeding might recur. 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 The onset of this condition was acute. The course is improved. Aggravating factors: none. Alleviating factors: none.    The history is provided by the patient.    Past Medical History  Diagnosis Date  . Vein, varicose   . Arthritis    Past Surgical History  Procedure Laterality Date  . Hip surgery     No family history on file. History  Substance Use Topics  . Smoking status: Current Every Day Smoker  . Smokeless tobacco: Not on file  . Alcohol Use: Yes     Comment: intermittent, unable to quantify   OB History   Grav Para Term Preterm Abortions TAB SAB Ect Mult Living                 Review of Systems  Constitutional: Negative for activity change.  Cardiovascular: Positive for leg swelling.  Musculoskeletal: Negative for arthralgias, back pain, joint swelling and neck pain.  Skin: Positive for wound.  Neurological: Negative for weakness and numbness.    Allergies  Review of patient's allergies indicates no known allergies.  Home Medications   Current Outpatient Rx  Name  Route  Sig  Dispense  Refill  . ranitidine (ZANTAC) 75 MG tablet   Oral   Take 75 mg by mouth every 4 (four) hours as needed. For indigestion          BP  141/90  Pulse 85  Temp(Src) 98.4 F (36.9 C)  Resp 20  SpO2 96% Physical Exam  Nursing note and vitals reviewed. Constitutional: She appears well-developed and well-nourished.  HENT:  Head: Normocephalic and atraumatic.  Eyes: Conjunctivae are normal.  Neck: Normal range of motion. Neck supple.  Cardiovascular:  Pulses:      Dorsalis pedis pulses are 2+ on the right side, and 2+ on the left side.       Posterior tibial pulses are 2+ on the right side, and 2+ on the left side.  Pulmonary/Chest: No respiratory distress.  Neurological: She is alert.  Skin: Skin is warm and dry.  Patient with bilateral lower extremity pitting edema with moderate venous stasis changes bilaterally. Edema is improved where the patient had a pressure bandage applied. Proximal to the bandage is mild erythema of the calf without tenderness. Area is not warm or cellulitic in appearance. Compartments are soft. There is a small clot overlying varicosity with minimal dark blood oozing.  Psychiatric: Her mood appears anxious.    ED Course  Procedures (including critical care time) Labs Review Labs Reviewed - No data to display Imaging Review No results found.  EKG Interpretation   None      11:40 AM Patient seen and examined. Previous ED visit chart reviewed. Bandage was removed. Overall, area appears well. After  removal of bandage there is a small amount of oozing. No cellulitis. Will replace pressure bandage taking care to not make bandage quite as tight. Patient encouraged to change bandage every 12-24 hours. If bleeding recurs, she is instructed to hold direct pressure with 2 fingers on the site for 15 minutes without letting go. If bleeding continues, she is to repeat this once. If bleeding still continues to see his to return to the emergency department. Encouraged to followup with vascular surgery next month as planned.   Vital signs reviewed and are as follows: Filed Vitals:   11/23/13 1103  BP:  141/90  Pulse: 85  Temp: 98.4 F (36.9 C)  Resp: 20      MDM   1. Varicose vein of leg    Patient with oozing varicose veins of right lower leg. She did not follow appropriate wound care instructions, however has not had recurrence of bleeding. Patient has moderate venous stasis changes of lower extremities. No evidence of cellulitis. No evidence of ischemia. She has normal pedal pulses. No concern for threatened limb. No concern for compartment syndrome.    Renne CriglerJoshua Latasia Silberstein, PA-C 11/23/13 1147

## 2013-11-24 NOTE — ED Provider Notes (Signed)
Medical screening examination/treatment/procedure(s) were performed by non-physician practitioner and as supervising physician I was immediately available for consultation/collaboration.  EKG Interpretation   None         Audree CamelScott T Miel Wisener, MD 11/24/13 [phone]

## 2013-12-20 ENCOUNTER — Encounter: Payer: Self-pay | Admitting: Vascular Surgery

## 2013-12-20 ENCOUNTER — Other Ambulatory Visit: Payer: Self-pay | Admitting: *Deleted

## 2013-12-20 DIAGNOSIS — I83893 Varicose veins of bilateral lower extremities with other complications: Secondary | ICD-10-CM

## 2013-12-21 ENCOUNTER — Encounter: Payer: Medicare Other | Admitting: Vascular Surgery

## 2013-12-21 ENCOUNTER — Encounter (HOSPITAL_COMMUNITY): Payer: Medicare Other

## 2013-12-26 ENCOUNTER — Encounter: Payer: Self-pay | Admitting: Vascular Surgery

## 2014-01-11 ENCOUNTER — Encounter: Payer: Self-pay | Admitting: Vascular Surgery

## 2014-01-12 ENCOUNTER — Encounter: Payer: Self-pay | Admitting: Vascular Surgery

## 2014-01-12 ENCOUNTER — Ambulatory Visit (INDEPENDENT_AMBULATORY_CARE_PROVIDER_SITE_OTHER): Payer: Medicare Other | Admitting: Vascular Surgery

## 2014-01-12 ENCOUNTER — Ambulatory Visit (HOSPITAL_COMMUNITY)
Admission: RE | Admit: 2014-01-12 | Discharge: 2014-01-12 | Disposition: A | Discharge status: Home / Self Care | Payer: Medicare Other | Source: Ambulatory Visit | Attending: Vascular Surgery | Admitting: Vascular Surgery

## 2014-01-12 VITALS — BP 141/69 | HR 64 | Ht 68.0 in | Wt 112.0 lb

## 2014-01-12 DIAGNOSIS — I83893 Varicose veins of bilateral lower extremities with other complications: Secondary | ICD-10-CM

## 2014-01-12 NOTE — Progress Notes (Signed)
Referred by:  Dr. Lerry Linerwight Williams  Reason for referral: Swollen B leg  History of Present Illness  Mackenzie SinghMartha F [name] is a 68 y.o. ([birth year]) female who presents with chief complaint: swollen B leg.  Patient notes, onset of swelling years ago, associated with standing.  The patient's symptoms include: swelling and pain.  The patient has had mp history of DVT, no history of pregnancy, known history of varicose vein, no history of venous stasis ulcers, no history of  Lymphedema and known history of skin changes in lower legs.  There is no family history of venous disorders.  The patient has never used compression stockings in the past.  Pt notes few months ago, bleeding from a varicose vein in R leg.  Past Medical History  Diagnosis Date  . Vein, varicose   . Arthritis     Past Surgical History  Procedure Laterality Date  . Hip surgery    . Joint replacement      History   Social History  . Marital Status: Divorced    Spouse Name: N/A    Number of Children: N/A  . Years of Education: N/A   Occupational History  . Not on file.   Social History Main Topics  . Smoking status: Former Smoker    Quit date: 01/12/2013  . Smokeless tobacco: Never Used  . Alcohol Use: No     Comment: intermittent, unable to quantify  . Drug Use: No  . Sexual Activity: Not on file   Other Topics Concern  . Not on file   Social History Narrative  . No narrative on file    Family History  Problem Relation Age of Onset  . Adopted: Yes    Current Outpatient Prescriptions on File Prior to Visit  Medication Sig Dispense Refill  . ranitidine (ZANTAC) 75 MG tablet Take 75 mg by mouth every 4 (four) hours as needed. For indigestion       No current facility-administered medications on file prior to visit.    No Known Allergies  REVIEW OF SYSTEMS:  (Positives checked otherwise negative)  CARDIOVASCULAR:  []  chest pain, []  chest pressure, []  palpitations, []  shortness of breath when  laying flat, []  shortness of breath with exertion,  [x]  pain in feet when walking, []  pain in feet when laying flat, []  history of blood clot in veins (DVT), []  history of phlebitis, [x]  swelling in legs, [x]  varicose veins  PULMONARY:  []  productive cough, []  asthma, []  wheezing  NEUROLOGIC:  []  weakness in arms or legs, []  numbness in arms or legs, []  difficulty speaking or slurred speech, []  temporary loss of vision in one eye, []  dizziness  HEMATOLOGIC:  [x]  bleeding problems, []  problems with blood clotting too easily  MUSCULOSKEL:  []  joint pain, []  joint swelling  GASTROINTEST:  []  vomiting blood, []  blood in stool     GENITOURINARY:  []  burning with urination, []  blood in urine  PSYCHIATRIC:  []  history of major depression  INTEGUMENTARY:  [x]  rashes, []  ulcers  CONSTITUTIONAL:  [x]  fever, [x]  chills   Physical Examination Filed Vitals:   01/12/14 1010  BP: 141/69  Pulse: 64  Height: 5\' 8"  (1.727 m)  Weight: 112 lb (50.803 kg)  SpO2: 100%   Body mass index is 17.03 kg/(m^2).  General: A&O x 3, WD, thin, haggard appearance  Head: [place]/AT  Ear/Nose/Throat: Hearing grossly intact, nares w/o erythema or drainage, oropharynx w/o Erythema/Exudate  Eyes: PERRLA, EOMI  Neck: Supple, no nuchal  rigidity, no palpable LAD  Pulmonary: Sym exp, good air movt, CTAB, no rales, rhonchi, & wheezing  Cardiac: RRR, Nl S1, S2, no Murmurs, rubs or gallops  Vascular: Vessel Right Left  Radial Palpable Palpable  Brachial Palpable Palpable  Carotid Palpable, without bruit Palpable, without bruit  Aorta Not palpable N/A  Femoral Palpable Palpable  Popliteal Not palpable Not palpable  PT Palpable Palpable  DP Not Palpable Not Palpable   Gastrointestinal: soft, NTND, -G/R, - HSM, - masses, - CVAT B  Musculoskeletal: M/S 5/5 throughout , Extremities without ischemic changes , extensive varicosities and spider veins, B LDS, ankle edema 1-2+  Neurologic: CN 2-12 intact , Pain and  light touch intact in extremities , Motor exam as listed above  Psychiatric: Judgment intact, Mood & affect appropriate for pt's clinical situation  Dermatologic: See M/S exam for extremity exam, no rashes otherwise noted  Lymph : No Cervical, Axillary, or Inguinal lymphadenopathy   Non-Invasive Vascular Imaging  BLE Venous Insufficiency Duplex (Date: 01/12/2014):   RLE: no DVT and SVT, no GSV reflux, + deep venous reflux  LLE: no DVT and SVT, no GSV reflux, + deep venous reflux  Medical Decision Making  Mackenzie [name] is a 68 y.o. female who presents with: BLE chronic venous insufficiency (C4).   Based on the patient's history and examination, I recommend: compression therapy.  I discussed with the patient the use of her 20-30 mm thigh high compression stockings.  This patient's anatomy is not compatible with EVLA GSV.  I doubt she would meet the criteria to consider deep vein valve transplantation, which is not one in [place] anyway.  Thank you for allowing [place] to participate in this patient's care.  Leonides Sake, MD [facility] Office: [phone] Pager: [phone]  01/12/2014, 10:39 AM

## 2014-01-19 ENCOUNTER — Telehealth: Payer: Self-pay | Admitting: *Deleted

## 2014-01-19 NOTE — Telephone Encounter (Signed)
Mrs. Mackenzie [name] was seen by Dr. Imogene Burnhen on 01-12-2014 for varicose veins with history of bleeding varicosities.  She has questions about when to wear compression hose, protecting sensitive skin from compression hose, and how to stop bleeding from varicosities if it should reoccur.  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  Emphasized to elevate her legs as much as possible when she is not working.  Suggested that she use Telfa pads (nonstick pads) over her sensitive shin over her shins and pull compression hose up and over those areas.  Reviewed with her how to stop bleeding varicosities if they should reoccur by laying down and elevating her leg above the level of her heart and using direct compression with a single finger over the bleeding varicosity until the bleeding stops.  Advised her once bleeding stopped, to cover with a pad and an ace bandage for added compression.  Mackenzie [name] verbalized understanding.

## 2014-12-31 ENCOUNTER — Emergency Department (HOSPITAL_COMMUNITY)
Admission: EM | Admit: 2014-12-31 | Discharge: 2014-12-31 | Disposition: A | Discharge status: Home / Self Care | Payer: Medicare Other | Attending: Emergency Medicine | Admitting: Emergency Medicine

## 2014-12-31 ENCOUNTER — Encounter (HOSPITAL_COMMUNITY): Payer: Self-pay

## 2014-12-31 ENCOUNTER — Emergency Department (HOSPITAL_COMMUNITY): Payer: Medicare Other

## 2014-12-31 DIAGNOSIS — X58XXXA Exposure to other specified factors, initial encounter: Secondary | ICD-10-CM | POA: Insufficient documentation

## 2014-12-31 DIAGNOSIS — Y9289 Other specified places as the place of occurrence of the external cause: Secondary | ICD-10-CM | POA: Insufficient documentation

## 2014-12-31 DIAGNOSIS — Z8739 Personal history of other diseases of the musculoskeletal system and connective tissue: Secondary | ICD-10-CM | POA: Diagnosis not present

## 2014-12-31 DIAGNOSIS — Y9301 Activity, walking, marching and hiking: Secondary | ICD-10-CM | POA: Insufficient documentation

## 2014-12-31 DIAGNOSIS — S90122A Contusion of left lesser toe(s) without damage to nail, initial encounter: Secondary | ICD-10-CM | POA: Diagnosis not present

## 2014-12-31 DIAGNOSIS — Z87891 Personal history of nicotine dependence: Secondary | ICD-10-CM | POA: Diagnosis not present

## 2014-12-31 DIAGNOSIS — Z8679 Personal history of other diseases of the circulatory system: Secondary | ICD-10-CM | POA: Diagnosis not present

## 2014-12-31 DIAGNOSIS — S8992XA Unspecified injury of left lower leg, initial encounter: Secondary | ICD-10-CM | POA: Diagnosis present

## 2014-12-31 DIAGNOSIS — M79672 Pain in left foot: Secondary | ICD-10-CM

## 2014-12-31 DIAGNOSIS — M25572 Pain in left ankle and joints of left foot: Secondary | ICD-10-CM

## 2014-12-31 DIAGNOSIS — S99912A Unspecified injury of left ankle, initial encounter: Secondary | ICD-10-CM | POA: Diagnosis not present

## 2014-12-31 DIAGNOSIS — Y998 Other external cause status: Secondary | ICD-10-CM | POA: Diagnosis not present

## 2014-12-31 DIAGNOSIS — M25562 Pain in left knee: Secondary | ICD-10-CM

## 2014-12-31 MED ORDER — HYDROCODONE-ACETAMINOPHEN 5-325 MG PO TABS: 1.0000 | ORAL_TABLET | Freq: Four times a day (QID) | ORAL | Status: DC | PRN

## 2014-12-31 NOTE — Discharge Instructions (Signed)

## 2014-12-31 NOTE — ED Provider Notes (Signed)
CSN: [id number]     Arrival date & time 12/31/14  [id number] History   First MD Initiated Contact with Patient 12/31/14 [id number]     Chief Complaint  Patient presents with  . Knee Pain     (Consider location/radiation/quality/duration/timing/severity/associated sxs/prior Treatment) HPI Comments: Patient presents to the emergency department with chief complaint of left foot and left knee pain. She states that she "jammed her foot" on Friday. She states that the pain has been constant and severe. She is able to ambulate with a cane. She denies any other injuries. She has tried taking Aleve with mild relief. The pain is aggravated with movement and palpation. She states that she has a history of osteoarthritis. She denies any other symptoms at this time.  The history is provided by the patient. No language interpreter was used.    Past Medical History  Diagnosis Date  . Vein, varicose   . Arthritis    Past Surgical History  Procedure Laterality Date  . Hip surgery    . Joint replacement     Family History  Problem Relation Age of Onset  . Adopted: Yes   History  Substance Use Topics  . Smoking status: Former Smoker    Quit date: 01/12/2013  . Smokeless tobacco: Never Used  . Alcohol Use: No     Comment: intermittent, unable to quantify   OB History    No data available     Review of Systems  Constitutional: Negative for fever and chills.  Respiratory: Negative for shortness of breath.   Cardiovascular: Negative for chest pain.  Gastrointestinal: Negative for nausea, vomiting, diarrhea and constipation.  Genitourinary: Negative for dysuria.  Musculoskeletal: Positive for arthralgias.  All other systems reviewed and are negative.     Allergies  Review of patient's allergies indicates no known allergies.  Home Medications   Prior to Admission medications   Medication Sig Start Date End Date Taking? Authorizing Provider  ranitidine (ZANTAC) 75 MG tablet Take 75 mg by mouth  every 4 (four) hours as needed. For indigestion    Historical Provider, MD   BP 188/130 mmHg  Pulse 95  Temp(Src) 98 F (36.7 C) (Oral)  Resp 16  Ht 5\' 5"  (1.651 m)  SpO2 99% Physical Exam  Constitutional: She is oriented to person, place, and time. She appears well-developed and well-nourished.  HENT:  Head: Normocephalic and atraumatic.  Eyes: Conjunctivae and EOM are normal. Pupils are equal, round, and reactive to light.  Neck: Normal range of motion. Neck supple.  Cardiovascular: Normal rate and regular rhythm.  Exam reveals no gallop and no friction rub.   No murmur heard. Pulmonary/Chest: Effort normal and breath sounds normal. No respiratory distress. She has no wheezes. She has no rales. She exhibits no tenderness.  Abdominal: Soft. She exhibits no distension.  Musculoskeletal: Normal range of motion. She exhibits no edema or tenderness.  Left knee nontender to palpation, no bony abnormality or deformity, range of motion strength 5/5, no evidence of septic joint, or DVT Left ankle and foot range of motion strength 5/5, no bony abnormality or deformity  Neurological: She is alert and oriented to person, place, and time.  Skin: Skin is warm and dry.  Ecchymosis surrounding left third and fourth toes, skin is intact, no evidence of infection  Psychiatric: She has a normal mood and affect. Her behavior is normal. Judgment and thought content normal.  Nursing note and vitals reviewed.   ED Course  Procedures (including critical care time)  Labs Review Labs Reviewed - No data to display  Imaging Review Dg Knee Complete 4 Views Left  12/31/2014   CLINICAL DATA:  Pain following injury; patient struck knee against stationary object  EXAM: LEFT KNEE - COMPLETE 4+ VIEW  COMPARISON:  None.  FINDINGS: Frontal, lateral, and bilateral oblique views were obtained. Bones are osteoporotic. No fracture or dislocation. No joint effusion. Joint spaces appear intact. No erosive change.   IMPRESSION: No fracture or dislocation. No appreciable arthropathy. No effusion. Bones osteoporotic.   Electronically Signed   By: Bretta Bang III M.D.   On: 12/31/2014 07:32   Dg Foot Complete Left  12/31/2014   CLINICAL DATA:  Left foot pain and soft tissue swelling secondary to blunt trauma 3 days ago.  EXAM: LEFT FOOT - COMPLETE 3+ VIEW  COMPARISON:  None.  FINDINGS: There is no fracture or dislocation. There is marked osteopenia. Slight bunion formation on the head of the first metatarsal.  IMPRESSION: No acute abnormality.  Marked osteopenia.   Electronically Signed   By: Francene Boyers M.D.   On: 12/31/2014 07:34   Images of left lower extremity reviewed in the PACS system by me personally, I agree with radiologist's impression.   EKG Interpretation None      MDM   Final diagnoses:  Knee pain, acute, left  Left ankle pain  Left foot pain    Patient with left knee and left ankle pain. She states that she jammed her left foot while walking and working. Plain films are negative. Patient is able to ambulate. Will give her a ankle ASO and a knee sleeve. Treat pain, and recommend outpatient follow-up. No evidence of infection. Patient is stable and ready for discharge.    Roxy Horseman, PA-C 12/31/14 1610  Toy Cookey, MD 01/01/15 [phone]

## 2014-12-31 NOTE — ED Notes (Signed)
Pt states that she jammed her left foot and knee on Friday and the pain has not gotten any better. Pt ambulatory with cane to triage area. Pt alert and oriented.

## 2014-12-31 NOTE — ED Notes (Signed)
Patient transported to X-ray 

## 2015-01-14 ENCOUNTER — Emergency Department (HOSPITAL_COMMUNITY): Payer: Medicare Other

## 2015-01-14 ENCOUNTER — Encounter (HOSPITAL_COMMUNITY): Payer: Self-pay | Admitting: Emergency Medicine

## 2015-01-14 ENCOUNTER — Emergency Department (HOSPITAL_COMMUNITY)
Admission: EM | Admit: 2015-01-14 | Discharge: 2015-01-14 | Disposition: A | Discharge status: Home / Self Care | Payer: Medicare Other | Attending: Emergency Medicine | Admitting: Emergency Medicine

## 2015-01-14 DIAGNOSIS — S93402D Sprain of unspecified ligament of left ankle, subsequent encounter: Secondary | ICD-10-CM | POA: Diagnosis not present

## 2015-01-14 DIAGNOSIS — Z87891 Personal history of nicotine dependence: Secondary | ICD-10-CM | POA: Insufficient documentation

## 2015-01-14 DIAGNOSIS — Z79899 Other long term (current) drug therapy: Secondary | ICD-10-CM | POA: Diagnosis not present

## 2015-01-14 DIAGNOSIS — M1990 Unspecified osteoarthritis, unspecified site: Secondary | ICD-10-CM | POA: Insufficient documentation

## 2015-01-14 DIAGNOSIS — M25572 Pain in left ankle and joints of left foot: Secondary | ICD-10-CM | POA: Diagnosis present

## 2015-01-14 DIAGNOSIS — Z8679 Personal history of other diseases of the circulatory system: Secondary | ICD-10-CM | POA: Diagnosis not present

## 2015-01-14 DIAGNOSIS — X58XXXD Exposure to other specified factors, subsequent encounter: Secondary | ICD-10-CM | POA: Insufficient documentation

## 2015-01-14 MED ORDER — TRAMADOL HCL 50 MG PO TABS: 50.0000 mg | ORAL_TABLET | Freq: Four times a day (QID) | ORAL | Status: DC | PRN

## 2015-01-14 NOTE — ED Notes (Signed)
Per pt, sprained left ankle on the 29th, not gotten better, still swollen-states has'nt been able to see PCP for F/U

## 2015-01-14 NOTE — ED Notes (Signed)
Bed: [facility] Expected date:  Expected time:  Means of arrival:  Comments: 

## 2015-01-14 NOTE — Discharge Instructions (Signed)

## 2015-01-14 NOTE — ED Provider Notes (Signed)
CSN: [id number]     Arrival date & time 01/14/15  1914 History   First MD Initiated Contact with Patient 01/14/15 [phone]     Chief Complaint  Patient presents with  . Ankle Pain     (Consider location/radiation/quality/duration/timing/severity/associated sxs/prior Treatment) HPI Comments: Patient presents to the ER for evaluation of continued pain and swelling of the left lower leg, ankle and foot. Patient reports that she injured it on February 29. She was seen in the ER and had an x-ray. She reports that the x-rays were negative, but swelling has not gone down. Area still painful. She has not followed up with her doctor for this. Pain is moderate, constant and worsens with walking or movement.  Patient is a 69 y.o. female presenting with ankle pain.  Ankle Pain   Past Medical History  Diagnosis Date  . Vein, varicose   . Arthritis    Past Surgical History  Procedure Laterality Date  . Hip surgery    . Joint replacement     Family History  Problem Relation Age of Onset  . Adopted: Yes   History  Substance Use Topics  . Smoking status: Former Smoker    Quit date: 01/12/2013  . Smokeless tobacco: Never Used  . Alcohol Use: No     Comment: intermittent, unable to quantify   OB History    No data available     Review of Systems  Musculoskeletal: Positive for arthralgias.  All other systems reviewed and are negative.     Allergies  Review of patient's allergies indicates no known allergies.  Home Medications   Prior to Admission medications   Medication Sig Start Date End Date Taking? Authorizing Provider  loperamide (IMODIUM) 2 MG capsule Take 2 mg by mouth as needed for diarrhea or loose stools.   Yes Historical Provider, MD  ranitidine (ZANTAC) 75 MG tablet Take 75 mg by mouth 2 (two) times daily as needed (stomach ache). For indigestion   Yes Historical Provider, MD  HYDROcodone-acetaminophen (NORCO/VICODIN) 5-325 MG per tablet Take 1-2 tablets by mouth every 6  (six) hours as needed. Patient not taking: Reported on 01/14/2015 12/31/14   Roxy Horseman, PA-C   BP 150/76 mmHg  Pulse 75  Temp(Src) 98 F (36.7 C) (Oral)  Resp 16  SpO2 98% Physical Exam  Constitutional: She is oriented to person, place, and time. She appears well-developed and well-nourished. No distress.  HENT:  Head: Normocephalic and atraumatic.  Right Ear: Hearing normal.  Left Ear: Hearing normal.  Nose: Nose normal.  Mouth/Throat: Oropharynx is clear and moist and mucous membranes are normal.  Eyes: Conjunctivae and EOM are normal. Pupils are equal, round, and reactive to light.  Neck: Normal range of motion. Neck supple.  Cardiovascular: Regular rhythm, S1 normal and S2 normal.  Exam reveals no gallop and no friction rub.   No murmur heard. Pulmonary/Chest: Effort normal and breath sounds normal. No respiratory distress. She exhibits no tenderness.  Abdominal: Soft. Normal appearance and bowel sounds are normal. There is no hepatosplenomegaly. There is no tenderness. There is no rebound, no guarding, no tenderness at McBurney's point and negative Murphy's sign. No hernia.  Musculoskeletal: Normal range of motion.       Left ankle: She exhibits swelling. Tenderness. Lateral malleolus and medial malleolus tenderness found. No head of 5th metatarsal tenderness found.       Left foot: There is tenderness and swelling.  Neurological: She is alert and oriented to person, place, and time. She  has normal strength. No cranial nerve deficit or sensory deficit. Coordination normal. GCS eye subscore is 4. GCS verbal subscore is 5. GCS motor subscore is 6.  Skin: Skin is warm, dry and intact. No rash noted. No cyanosis.  Psychiatric: She has a normal mood and affect. Her speech is normal and behavior is normal. Thought content normal.  Nursing note and vitals reviewed.   ED Course  Procedures (including critical care time) Labs Review Labs Reviewed - No data to display  Imaging  Review Dg Ankle Complete Left  01/14/2015   CLINICAL DATA:  sprained lt ankle approx 3 weeks ago and is still having pain, sts lt foot & ankle  EXAM: LEFT ANKLE COMPLETE - 3+ VIEW  COMPARISON:  12/31/2014  FINDINGS: There is no evidence of fracture, dislocation, or joint effusion. Diffuse osteopenia. Ankle mortise intact. There is no evidence of arthropathy or other focal bone abnormality. Soft tissues are unremarkable.  IMPRESSION: Osteopenia without fracture or other acute abnormality.   Electronically Signed   By: Corlis Leak  Hassell M.D.   On: 01/14/2015 09:30   Dg Foot Complete Left  01/14/2015   CLINICAL DATA:  sprained lt ankle approx 3 weeks ago and is still having pain, sts lt foot & ankle  EXAM: LEFT FOOT - COMPLETE 3+ VIEW  COMPARISON:  12/31/2014  FINDINGS: There is no evidence of fracture or dislocation. Diffuse osteopenia. There is no evidence of arthropathy or other focal bone abnormality. Soft tissues are unremarkable.  IMPRESSION: 1. Osteopenia without fracture or other acute abnormality   Electronically Signed   By: Corlis Leak  Hassell M.D.   On: 01/14/2015 10:12     EKG Interpretation None      MDM   Final diagnoses:  None   left ankle injury  Patient presents to the ER for evaluation of persistent pain and swelling of the left ankle and foot. Patient injured her ankle 2 weeks ago. Initial x-rays were negative. She reports that the area still very swollen and tender. Examination did confirm diffuse swelling and tenderness. X-rays once again are negative. Duplex was performed to rule out DVT, this was negative. Patient will be discharge, follow-up with orthopedics for repeat evaluation of possible ligamentous injury.    Gilda Creasehristopher J Natalina Wieting, MD 01/14/15 1209

## 2015-01-14 NOTE — Progress Notes (Signed)
*  PRELIMINARY RESULTS* Vascular Ultrasound Left lower extremity venous duplex has been completed.  Preliminary findings: No evidence of DVT  Farrel DemarkJill Eunice, RDMS, RVT  01/14/2015, 11:37 AM

## 2015-08-16 ENCOUNTER — Ambulatory Visit: Payer: Medicare Other | Admitting: Podiatry

## 2015-08-30 ENCOUNTER — Ambulatory Visit: Payer: Medicare Other | Admitting: Podiatry

## 2015-09-13 ENCOUNTER — Ambulatory Visit (INDEPENDENT_AMBULATORY_CARE_PROVIDER_SITE_OTHER): Payer: Medicare Other | Admitting: Podiatry

## 2015-09-13 ENCOUNTER — Ambulatory Visit (INDEPENDENT_AMBULATORY_CARE_PROVIDER_SITE_OTHER): Payer: Medicare Other

## 2015-09-13 ENCOUNTER — Encounter: Payer: Self-pay | Admitting: Podiatry

## 2015-09-13 VITALS — BP 110/65 | HR 79 | Resp 16 | Ht 68.0 in | Wt 115.0 lb

## 2015-09-13 DIAGNOSIS — M79672 Pain in left foot: Secondary | ICD-10-CM

## 2015-09-13 DIAGNOSIS — M79671 Pain in right foot: Secondary | ICD-10-CM

## 2015-09-13 DIAGNOSIS — R601 Generalized edema: Secondary | ICD-10-CM

## 2015-09-13 DIAGNOSIS — L600 Ingrowing nail: Secondary | ICD-10-CM

## 2015-09-13 NOTE — Progress Notes (Signed)
   Subjective:    Patient ID: Mackenzie [name], female    DOB: May 26, 1946, 69 y.o.   MRN: [id number]  HPI Patient presents with bilateral foot pain; entire foot; swelling. Pt stated, "left foot hurts more"; x6 months.  Patient also presents with a nail problem in their left foot, great toe-lateral side; x6 months.   Review of Systems  HENT: Positive for tinnitus.   Cardiovascular: Positive for leg swelling.  Musculoskeletal: Positive for myalgias, back pain, arthralgias and gait problem.  Skin: Positive for color change and rash.  Hematological: Bruises/bleeds easily.  All other systems reviewed and are negative.      Objective:   Physical Exam        Assessment & Plan:

## 2015-09-15 NOTE — Progress Notes (Signed)
Subjective:     Patient ID: Mackenzie [name], female   DOB: 04-Sep-1946, 69 y.o.   MRN: [id number]  HPI patient presents stating I have had swelling in both my feet and some thickening of my nailbed on the left big toe. Patient states that she's tried trimming without significant relief   Review of Systems  All other systems reviewed and are negative.      Objective:   Physical Exam  Constitutional: She is oriented to person, place, and time.  Cardiovascular: Intact distal pulses.   Musculoskeletal: Normal range of motion.  Neurological: She is oriented to person, place, and time.  Skin: Skin is warm and dry.  Nursing note and vitals reviewed.  neurovascular status found to be intact muscle strength was found to be adequate with moderate forefoot edema noted bilateral. Patient's left hallux does have some thickness with slight incurvation of the lateral side localized in nature and patient's found have good digital perfusion well oriented 3     Assessment:     Doing well post swelling that's localized in nature with negative Homans sign noted and mild ingrown toenail left    Plan:     Reviewed both conditions and recommended elevation compression trimming of the nail and consideration for nail corner removal if symptoms persist. Reappoint as needed

## 2015-09-17 ENCOUNTER — Telehealth: Payer: Self-pay | Admitting: *Deleted

## 2015-09-17 NOTE — Telephone Encounter (Signed)
Left message for patient at [phone] (Cell #) to check to see how they were doing from their ingrown toenail procedure that was performed on Friday, September 13, 2015. Waiting for a response.

## 2015-10-28 IMAGING — CR DG FOOT COMPLETE 3+V*L*
3 series · 3 of 3 positions shown · non-contrast
Comparison: 12/31/2014

CLINICAL DATA: sprained lt ankle approx 3 weeks ago and is still
having pain, Aujla lt foot & ankle

EXAM:
LEFT FOOT - COMPLETE 3+ VIEW

[x foot ap left]
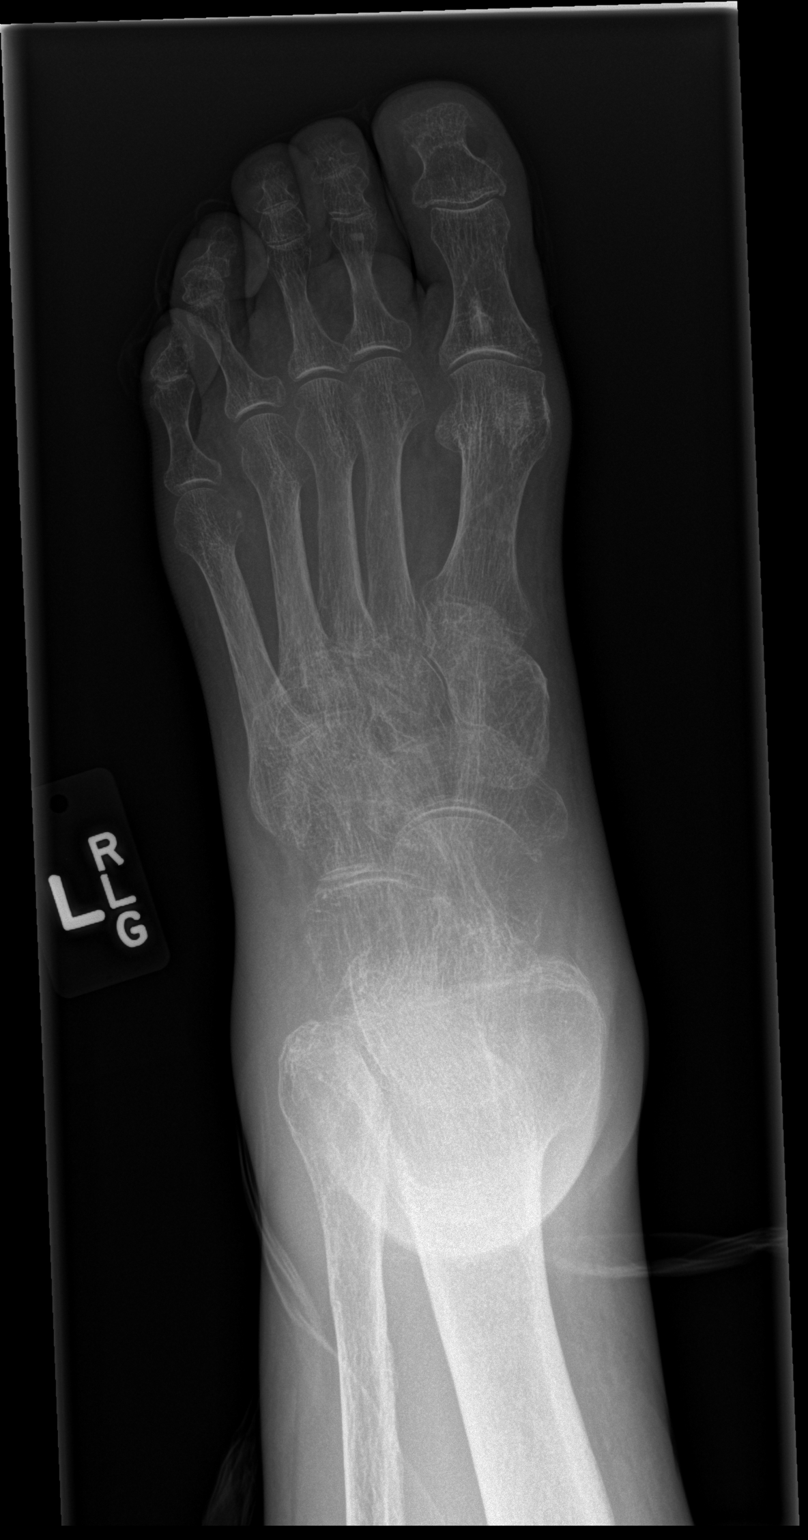

[x foot obl left]
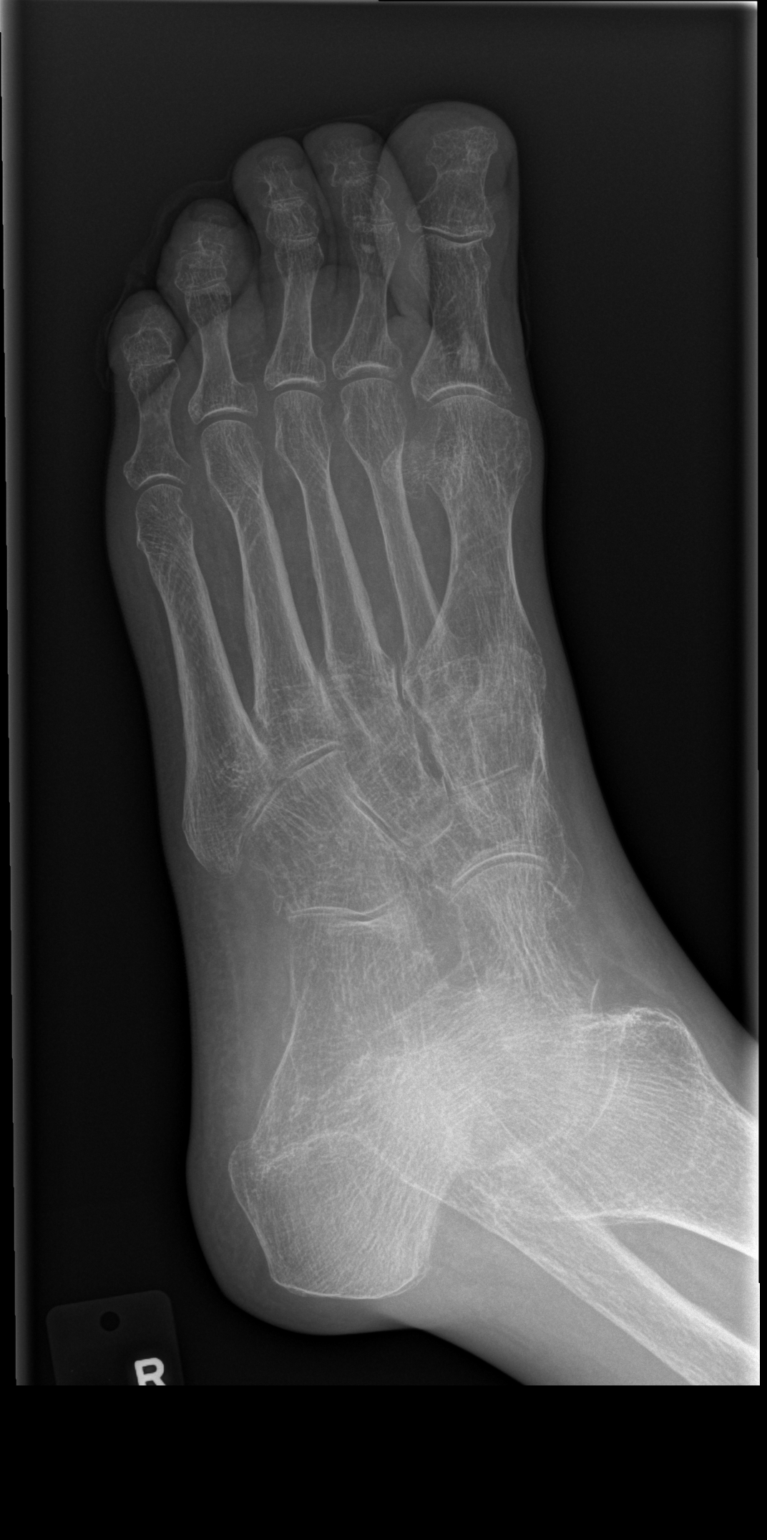

[x foot lat left]
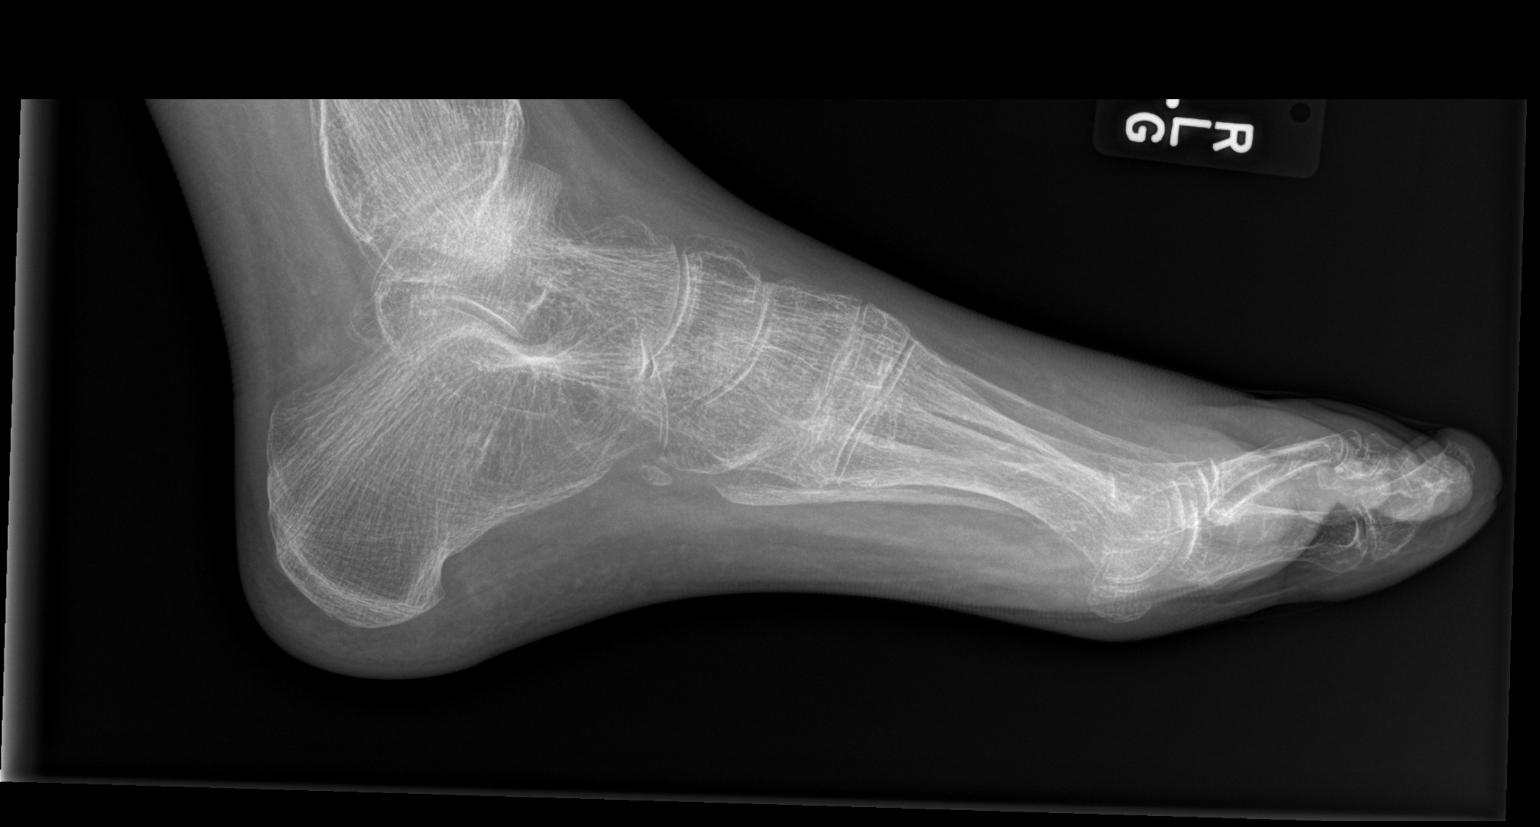

[3 of 3 positions shown; findings below may reference images not displayed]

FINDINGS: There is no evidence of fracture or dislocation. Diffuse osteopenia.
There is no evidence of arthropathy or other focal bone abnormality.
Soft tissues are unremarkable.
IMPRESSION: 1. Osteopenia without fracture or other acute abnormality

## 2016-01-26 ENCOUNTER — Observation Stay (HOSPITAL_COMMUNITY)
Admission: EM | Admit: 2016-01-26 | Discharge: 2016-01-28 | Disposition: A | Discharge status: Skilled Nursing Facility | Payer: Medicare Other | Attending: Internal Medicine | Admitting: Internal Medicine

## 2016-01-26 ENCOUNTER — Encounter (HOSPITAL_COMMUNITY): Payer: Self-pay | Admitting: Emergency Medicine

## 2016-01-26 ENCOUNTER — Emergency Department (HOSPITAL_COMMUNITY): Payer: Medicare Other

## 2016-01-26 ENCOUNTER — Emergency Department (HOSPITAL_BASED_OUTPATIENT_CLINIC_OR_DEPARTMENT_OTHER): Admit: 2016-01-26 | Discharge: 2016-01-26 | Disposition: A | Discharge status: Home / Self Care | Payer: Medicare Other

## 2016-01-26 DIAGNOSIS — E440 Moderate protein-calorie malnutrition: Secondary | ICD-10-CM | POA: Insufficient documentation

## 2016-01-26 DIAGNOSIS — M25561 Pain in right knee: Secondary | ICD-10-CM | POA: Diagnosis not present

## 2016-01-26 DIAGNOSIS — F1021 Alcohol dependence, in remission: Secondary | ICD-10-CM | POA: Diagnosis not present

## 2016-01-26 DIAGNOSIS — Z87891 Personal history of nicotine dependence: Secondary | ICD-10-CM | POA: Diagnosis not present

## 2016-01-26 DIAGNOSIS — M7989 Other specified soft tissue disorders: Secondary | ICD-10-CM

## 2016-01-26 DIAGNOSIS — Y92000 Kitchen of unspecified non-institutional (private) residence as  the place of occurrence of the external cause: Secondary | ICD-10-CM | POA: Insufficient documentation

## 2016-01-26 DIAGNOSIS — W228XXA Striking against or struck by other objects, initial encounter: Secondary | ICD-10-CM | POA: Diagnosis not present

## 2016-01-26 DIAGNOSIS — S82141A Displaced bicondylar fracture of right tibia, initial encounter for closed fracture: Secondary | ICD-10-CM | POA: Diagnosis not present

## 2016-01-26 DIAGNOSIS — M79609 Pain in unspecified limb: Secondary | ICD-10-CM

## 2016-01-26 DIAGNOSIS — Z681 Body mass index (BMI) 19 or less, adult: Secondary | ICD-10-CM | POA: Diagnosis not present

## 2016-01-26 DIAGNOSIS — M25569 Pain in unspecified knee: Secondary | ICD-10-CM

## 2016-01-26 DIAGNOSIS — S8391XA Sprain of unspecified site of right knee, initial encounter: Secondary | ICD-10-CM

## 2016-01-26 DIAGNOSIS — M1990 Unspecified osteoarthritis, unspecified site: Secondary | ICD-10-CM | POA: Diagnosis not present

## 2016-01-26 DIAGNOSIS — F1029 Alcohol dependence with unspecified alcohol-induced disorder: Secondary | ICD-10-CM

## 2016-01-26 DIAGNOSIS — R600 Localized edema: Secondary | ICD-10-CM

## 2016-01-26 DIAGNOSIS — M25461 Effusion, right knee: Secondary | ICD-10-CM | POA: Diagnosis not present

## 2016-01-26 LAB — CBC WITH DIFFERENTIAL/PLATELET
BASOS ABS: 0 10*3/uL (ref 0.0–0.1)
BASOS PCT: 0 %
Eosinophils Absolute: 0 10*3/uL (ref 0.0–0.7)
Eosinophils Relative: 0 %
HCT: 36 % (ref 36.0–46.0)
Hemoglobin: 12.8 g/dL (ref 12.0–15.0)
LYMPHS PCT: 13 %
Lymphs Abs: 1.1 10*3/uL (ref 0.7–4.0)
MCH: 40 pg — ABNORMAL HIGH (ref 26.0–34.0)
MCHC: 35.6 g/dL (ref 30.0–36.0)
MCV: 112.5 fL — AB (ref 78.0–100.0)
MONO ABS: 1.2 10*3/uL — AB (ref 0.1–1.0)
Monocytes Relative: 15 %
Neutro Abs: 5.9 10*3/uL (ref 1.7–7.7)
Neutrophils Relative %: 72 %
PLATELETS: 206 10*3/uL (ref 150–400)
RBC: 3.2 MIL/uL — AB (ref 3.87–5.11)
RDW: 14.2 % (ref 11.5–15.5)
WBC: 8.2 10*3/uL (ref 4.0–10.5)

## 2016-01-26 LAB — COMPREHENSIVE METABOLIC PANEL
ALBUMIN: 3.3 g/dL — AB (ref 3.5–5.0)
ALT: 27 U/L (ref 14–54)
AST: 68 U/L — AB (ref 15–41)
Alkaline Phosphatase: 144 U/L — ABNORMAL HIGH (ref 38–126)
Anion gap: 10 (ref 5–15)
BUN: 5 mg/dL — AB (ref 6–20)
CHLORIDE: 95 mmol/L — AB (ref 101–111)
CO2: 28 mmol/L (ref 22–32)
Calcium: 8.8 mg/dL — ABNORMAL LOW (ref 8.9–10.3)
Creatinine, Ser: 0.35 mg/dL — ABNORMAL LOW (ref 0.44–1.00)
GFR calc Af Amer: >60 mL/min (ref >60)
GFR calc non Af Amer: >60 mL/min (ref >60)
Glucose, Bld: 102 mg/dL — ABNORMAL HIGH (ref 65–99)
Potassium: 3.8 mmol/L (ref 3.5–5.1)
Sodium: 133 mmol/L — ABNORMAL LOW (ref 135–145)
Total Bilirubin: 1.1 mg/dL (ref 0.3–1.2)
Total Protein: 7.6 g/dL (ref 6.5–8.1)

## 2016-01-26 MED ORDER — HYDROCODONE-ACETAMINOPHEN 5-325 MG PO TABS
1.0000 | ORAL_TABLET | Freq: Once | ORAL | Status: AC
  Administered 2016-01-26: 1 via ORAL
  Filled 2016-01-26: qty 1

## 2016-01-26 MED ORDER — LORAZEPAM 1 MG PO TABS: 0.0000 mg | ORAL_TABLET | Freq: Four times a day (QID) | ORAL | Status: DC

## 2016-01-26 MED ORDER — HYDROCODONE-ACETAMINOPHEN 5-325 MG PO TABS: ORAL_TABLET | ORAL | Status: DC

## 2016-01-26 MED ORDER — OXYCODONE-ACETAMINOPHEN 5-325 MG PO TABS
2.0000 | ORAL_TABLET | Freq: Once | ORAL | Status: AC
  Administered 2016-01-26: 2 via ORAL
  Filled 2016-01-26: qty 2

## 2016-01-26 MED ORDER — LORAZEPAM 1 MG PO TABS: 0.0000 mg | ORAL_TABLET | Freq: Two times a day (BID) | ORAL | Status: DC

## 2016-01-26 MED ORDER — LIDOCAINE HCL (PF) 1 % IJ SOLN
5.0000 mL | Freq: Once | INTRAMUSCULAR | Status: DC
  Filled 2016-01-26: qty 5

## 2016-01-26 MED ORDER — LORAZEPAM 2 MG/ML IJ SOLN
1.0000 mg | Freq: Once | INTRAMUSCULAR | Status: AC
  Administered 2016-01-26: 1 mg via INTRAVENOUS
  Filled 2016-01-26: qty 1

## 2016-01-26 NOTE — ED Notes (Signed)
Patient friend at bedside advised that patient was getting anxious.  She advised that patient drinks 6+ beers per day.  Informed PA and admitting MD.

## 2016-01-26 NOTE — ED Notes (Signed)
Patient transported to X-ray 

## 2016-01-26 NOTE — Progress Notes (Signed)
VASCULAR LAB PRELIMINARY  PRELIMINARY  PRELIMINARY  PRELIMINARY  Right lower extremity venous duplex completed.    Preliminary report:  There is no DVT, SVT, or Baker's cyst noted in the right lower extremity.   Allye Hoyos, RVT 01/26/2016, 6:40 PM

## 2016-01-26 NOTE — H&P (Signed)
[facility] History and Physical  Mackenzie [name] ZOX:[id number] DOB: 1945/11/26 DOA: 01/26/2016  Referring physician: Ms.Nicole Pisciotta. PCP: [name], MD  Specialists: None.  Chief Complaint: Difficulty walking because of right knee pain.  HPI: Mackenzie [name] is a 70 y.o. female history of alcoholism and tobacco abuse presents to the ER because of increasing pain in the right knee. Patient states she had bumped her knee in her kitchen 3 weeks ago following which patient has been finding it increasingly difficult to walk hardly able to put any pressure on the right knee and not able to do her regular daily activities. Patient has been taking bedrest since then. Since then has worsened patient came to the ER. X-rays show right knee effusion. There is swelling of the right lower extremity below the right knee. Dopplers were negative for DVT. X-ray of the right ankle is unremarkable except for osteopenia. Since patient is finding it difficult to walk patient has been admitted for further management for further observation. At this time ER PA is attempting to get arthrocentesis done.   Review of Systems: As presented in the history of presenting illness, rest negative.  Past Medical History  Diagnosis Date  . Vein, varicose   . Arthritis    Past Surgical History  Procedure Laterality Date  . Hip surgery    . Joint replacement     Social History:  reports that she quit smoking about 3 years ago. She has never used smokeless tobacco. She reports that she drinks alcohol. She reports that she does not use illicit drugs. Where does patient live Home. Can patient participate in ADLs? Yes.  No Known Allergies  Family History:  Family History  Problem Relation Age of Onset  . Adopted: Yes      Prior to Admission medications   Medication Sig Start Date End Date Taking? Authorizing Provider  loperamide (IMODIUM) 2 MG capsule Take 2 mg by mouth as needed for diarrhea or loose  stools.   Yes Historical Provider, MD  ranitidine (ZANTAC) 75 MG tablet Take 75 mg by mouth 2 (two) times daily as needed (stomach ache). For indigestion   Yes Historical Provider, MD  HYDROcodone-acetaminophen (NORCO/VICODIN) 5-325 MG tablet Take 1-2 tablets by mouth every 6 hours as needed for pain and/or cough. 01/26/16   Wynetta Emery, PA-C    Physical Exam: Filed Vitals:   01/26/16 1636 01/26/16 1729 01/26/16 1915 01/26/16 2139  BP: 142/94 160/94 137/85 134/89  Pulse: 97 96 86 104  Temp: 99.1 F (37.3 C)     TempSrc: Oral     Resp: SpO2: 97% 96% 98% 99%     General:  Moderately built and poorly nourished.  Eyes: Anicteric no pallor.  ENT: No discharge from the ears eyes nose and mouth.  Neck: No mass felt.  Cardiovascular: S1 and S2 heard.  Respiratory: No rhonchi or crepitations.  Abdomen: Soft nontender bowel sounds present.  Skin: No rash.  Musculoskeletal: Patient has pain on moving the right knee but there is no obvious effusion. There is edema of the right lower extremity below the right knee.  Psychiatric: Appears normal.  Neurologic: Alert awake oriented to time place and person. Moves all extremities.  Labs on Admission:  Basic Metabolic Panel:  Recent Labs Lab 01/26/16 1957  NA 133*  K 3.8  CL 95*  CO2 28  GLUCOSE 102*  BUN 5*  CREATININE 0.35*  CALCIUM 8.8*   Liver Function Tests:  Recent Labs Lab 01/26/16 1957  AST 68*  ALT 27  ALKPHOS 144*  BILITOT 1.1  PROT 7.6  ALBUMIN 3.3*   No results for input(s): LIPASE, AMYLASE in the last 168 hours. No results for input(s): AMMONIA in the last 168 hours. CBC:  Recent Labs Lab 01/26/16 1957  WBC 8.2  NEUTROABS 5.9  HGB 12.8  HCT 36.0  MCV 112.5*  PLT 206   Cardiac Enzymes: No results for input(s): CKTOTAL, CKMB, CKMBINDEX, TROPONINI in the last 168 hours.  BNP (last 3 results) No results for input(s): BNP in the last 8760 hours.  ProBNP (last 3 results) No  results for input(s): PROBNP in the last 8760 hours.  CBG: No results for input(s): GLUCAP in the last 168 hours.  Radiological Exams on Admission: Dg Tibia/fibula Right  01/26/2016  CLINICAL DATA:  Pain and swelling EXAM: RIGHT TIBIA AND FIBULA - 2 VIEW COMPARISON:  Right knee same day FINDINGS: Three views of the right tibia-fibula submitted. No acute fracture or subluxation. There is diffuse osteopenia. IMPRESSION: No acute fracture or subluxation.  Diffuse osteopenia. Electronically Signed   By: Natasha MeadLiviu  Pop M.D.   On: 01/26/2016 17:50   Dg Knee Complete 4 Views Right  01/26/2016  CLINICAL DATA:  Right knee pain for 2 weeks. No known injury. Initial encounter. EXAM: RIGHT KNEE - COMPLETE 4+ VIEW COMPARISON:  None. FINDINGS: No acute bony abnormality is seen. Bones are markedly osteopenic. Joint spaces are preserved. Small joint effusion is noted. IMPRESSION: Negative for fracture. Small joint effusion. Osteopenia. Electronically Signed   By: Drusilla Kannerhomas  Dalessio M.D.   On: 01/26/2016 17:49     Assessment/Plan Principal Problem:   Right knee pain   1. Difficulty walking with right knee pain - at this time ER PA is planned to attempt arthrocentesis. I have also ordered an MRI of the right knee. If arthrocentesis was done we will need to follow the labs. Check MRI to see there is any cause for patient's severe pain. Check uric acid levels and sedimentation rate. Physical therapy consult. Morphine when necessary for pain. 2. History of alcoholism on CIWA protocol. 3. History of tobacco abuse - tobacco cessation counseling.   DVT Prophylaxis SCDs for known to arthrocentesis done.  Code Status: DO NOT INTUBATE.  Family Communication: Discussed with patient.  Disposition Plan: Admit for observation.    Mackenzie [name]. [facility] Pager [phone].  If 7PM-7AM, please contact night-coverage [email or web address] Password [email or web address] 01/26/2016, 11:04 PM

## 2016-01-26 NOTE — ED Notes (Signed)
Pt states she has had R knee pain x 2 weeks. States she hit it 3 weeks ago but it didn't hurt at the time. Also R foot is swollen. Alert and oriented.

## 2016-01-26 NOTE — ED Notes (Signed)
Patient in wheelchair to the bathroom.

## 2016-01-26 NOTE — ED Provider Notes (Signed)
CSN: [id number]     Arrival date & time 01/26/16  1629 History   First MD Initiated Contact with Patient 01/26/16 1638     Chief Complaint  Patient presents with  . Knee Pain     (Consider location/radiation/quality/duration/timing/severity/associated sxs/prior Treatment) HPI   Blood pressure 160/94, pulse 96, temperature 99.1 F (37.3 C), temperature source Oral, resp. rate 18, SpO2 96 %.  Mackenzie [name] is a 70 y.o. female complaining of Severe right lower extremity pain worsening over the course of 3 weeks. Patient states that she initially thinks that she may have hit her knee against a table, she states initially there wasn't too much severe pain. States that over the course of several weeks she's progressively had to use a walker, she also had to now borrow a wheelchair from her neighbor because she is unable to Ambulate secondary to pain. Patient states that she lives in a condo, her bathroom is upstairs. She had to get a portable commode so that she could urinate down stairs. She's not really taking any pain medication. Associated symptoms of severe right foot swelling. She denies fevers, chills, states that her pain is exacerbated by weightbearing, she states she can flex the joint. There was no initial laceration. States she's not immunocompromised.    Past Medical History  Diagnosis Date  . Vein, varicose   . Arthritis    Past Surgical History  Procedure Laterality Date  . Hip surgery    . Joint replacement     Family History  Problem Relation Age of Onset  . Adopted: Yes   Social History  Substance Use Topics  . Smoking status: Former Smoker    Quit date: 01/12/2013  . Smokeless tobacco: Never Used  . Alcohol Use: Yes     Comment: intermittent, unable to quantify   OB History    No data available     Review of Systems  10 systems reviewed and found to be negative, except as noted in the HPI.  Allergies  Review of patient's allergies indicates no known  allergies.  Home Medications   Prior to Admission medications   Medication Sig Start Date End Date Taking? Authorizing Provider  loperamide (IMODIUM) 2 MG capsule Take 2 mg by mouth as needed for diarrhea or loose stools.   Yes Historical Provider, MD  ranitidine (ZANTAC) 75 MG tablet Take 75 mg by mouth 2 (two) times daily as needed (stomach ache). For indigestion   Yes Historical Provider, MD  HYDROcodone-acetaminophen (NORCO/VICODIN) 5-325 MG tablet Take 1-2 tablets by mouth every 6 hours as needed for pain and/or cough. 01/26/16   Neelam Tiggs, PA-C   BP 118/85 mmHg  Pulse 87  Temp(Src) 98.5 F (36.9 C) (Oral)  Resp 17  SpO2 98% Physical Exam  Constitutional: She is oriented to person, place, and time. She appears well-developed and well-nourished. No distress.  HENT:  Head: Normocephalic.  Eyes: Conjunctivae and EOM are normal.  Cardiovascular: Normal rate.   Pulmonary/Chest: Effort normal. No stridor.  Musculoskeletal: Normal range of motion. She exhibits edema and tenderness.  Right knee with mild effusion, no significant warmth, full active range of motion with slight pain. Patient has bilateral varicosities, she is diffusely tender to palpation from the needed the foot. She has significant edema to the foot, Refill is brisk 5, she is distally neurovascularly intact.  Neurological: She is alert and oriented to person, place, and time.  Psychiatric: She has a normal mood and affect.  Nursing note and  vitals reviewed.   ED Course  Procedures (including critical care time) Labs Review Labs Reviewed  CBC WITH DIFFERENTIAL/PLATELET - Abnormal; Notable for the following:    RBC 3.20 (*)    MCV 112.5 (*)    MCH 40.0 (*)    Monocytes Absolute 1.2 (*)    All other components within normal limits  COMPREHENSIVE METABOLIC PANEL - Abnormal; Notable for the following:    Sodium 133 (*)    Chloride 95 (*)    Glucose, Bld 102 (*)    BUN 5 (*)    Creatinine, Ser 0.35 (*)     Calcium 8.8 (*)    Albumin 3.3 (*)    AST 68 (*)    Alkaline Phosphatase 144 (*)    All other components within normal limits  BODY FLUID CULTURE  GRAM STAIN  GLUCOSE, SYNOVIAL FLUID  PROTEIN, SYNOVIAL FLUID  SYNOVIAL CELL COUNT + DIFF, W/ CRYSTALS  URIC ACID, SYNOVIAL FLUID  URIC ACID  SEDIMENTATION RATE  BASIC METABOLIC PANEL  CBC    Imaging Review Dg Tibia/fibula Right  01/26/2016  CLINICAL DATA:  Pain and swelling EXAM: RIGHT TIBIA AND FIBULA - 2 VIEW COMPARISON:  Right knee same day FINDINGS: Three views of the right tibia-fibula submitted. No acute fracture or subluxation. There is diffuse osteopenia. IMPRESSION: No acute fracture or subluxation.  Diffuse osteopenia. Electronically Signed   By: Natasha Mead M.D.   On: 01/26/2016 17:50   Dg Knee Complete 4 Views Right  01/26/2016  CLINICAL DATA:  Right knee pain for 2 weeks. No known injury. Initial encounter. EXAM: RIGHT KNEE - COMPLETE 4+ VIEW COMPARISON:  None. FINDINGS: No acute bony abnormality is seen. Bones are markedly osteopenic. Joint spaces are preserved. Small joint effusion is noted. IMPRESSION: Negative for fracture. Small joint effusion. Osteopenia. Electronically Signed   By: Drusilla Kanner M.D.   On: 01/26/2016 17:49   I have personally reviewed and evaluated these images and lab results as part of my medical decision-making.   EKG Interpretation None      MDM   Final diagnoses:  Right knee sprain, initial encounter  Edema of right lower extremity  Alcohol dependence with unspecified alcohol-induced disorder (HCC)    Filed Vitals:   01/26/16 1729 01/26/16 1915 01/26/16 2139 01/27/16 0000  BP: 160/94 137/85 134/89 118/85  Pulse: 96 86 104 87  Temp:    98.5 F (36.9 C)  TempSrc:    Oral  Resp:  SpO2: 96% 98% 99% 98%    Medications  acetaminophen (TYLENOL) tablet 650 mg (not administered)    Or  acetaminophen (TYLENOL) suppository 650 mg (not administered)  ondansetron (ZOFRAN) tablet  4 mg (not administered)    Or  ondansetron (ZOFRAN) injection 4 mg (not administered)  morphine 2 MG/ML injection 1 mg (not administered)  LORazepam (ATIVAN) tablet 1 mg (not administered)    Or  LORazepam (ATIVAN) injection 1 mg (not administered)  thiamine (VITAMIN B-1) tablet 100 mg (not administered)    Or  thiamine (B-1) injection 100 mg (not administered)  folic acid (FOLVITE) tablet 1 mg (not administered)  multivitamin with minerals tablet 1 tablet (not administered)  LORazepam (ATIVAN) tablet 0-4 mg (not administered)    Followed by  LORazepam (ATIVAN) tablet 0-4 mg (not administered)  oxyCODONE-acetaminophen (PERCOCET/ROXICET) 5-325 MG per tablet 2 tablet (2 tablets Oral Given 01/26/16 1732)  HYDROcodone-acetaminophen (NORCO/VICODIN) 5-325 MG per tablet 1 tablet (1 tablet Oral Given 01/26/16 2124)  LORazepam (ATIVAN)  injection 1 mg (1 mg Intravenous Given 01/26/16 2312)    Mackenzie [name] is 70 y.o. female presenting with Progressive pain and swelling to the right lower extremity. ? Minor trauma At the onset. Patient has small effusion, no significant warmth, good range of motion, I doubt this is a septic joint. She has significant swelling in the foot. She is diffusely tender to palpation from knee to foot. She has varicosities, concern for DVT.  X-ray of knee is negative, DVT study also negative. Blood work with no significant electrolyte abnormality, her albumin is very mildly depleted at 3.3. Considering this is unilateral swelling or doubt that there is a systemic cause. Unfortunately, this patient lives alone, she is having to borrow a wheelchair to get around. She doesn't have a bathroom on the first floor, she is having to use a portable potty. Patient states that she doesn't have the money to pay the co-pay for her primary care. Patient is not able to ambulate in the ED, will need admission. Case discussed with Dr. Toniann FailKakrakandy who accepts admission to a MedSurg floor. I would  like to tap this patient's knee at the admitting physician's request to evaluate for gout. Patient declines.       Mackenzie Emery[place]ole Park Beck, PA-C 01/27/16 0007  Nelva Nayobert Beaton, MD 01/29/16 [phone]

## 2016-01-26 NOTE — ED Notes (Signed)
Supplies requested set up at bedside.

## 2016-01-27 ENCOUNTER — Encounter (HOSPITAL_COMMUNITY): Payer: Self-pay | Admitting: *Deleted

## 2016-01-27 ENCOUNTER — Observation Stay (HOSPITAL_COMMUNITY): Payer: Medicare Other

## 2016-01-27 DIAGNOSIS — M25461 Effusion, right knee: Secondary | ICD-10-CM | POA: Diagnosis not present

## 2016-01-27 DIAGNOSIS — F1010 Alcohol abuse, uncomplicated: Secondary | ICD-10-CM | POA: Diagnosis not present

## 2016-01-27 DIAGNOSIS — R262 Difficulty in walking, not elsewhere classified: Secondary | ICD-10-CM

## 2016-01-27 DIAGNOSIS — Z720 Tobacco use: Secondary | ICD-10-CM | POA: Diagnosis not present

## 2016-01-27 DIAGNOSIS — M25561 Pain in right knee: Secondary | ICD-10-CM | POA: Diagnosis not present

## 2016-01-27 DIAGNOSIS — S82191A Other fracture of upper end of right tibia, initial encounter for closed fracture: Secondary | ICD-10-CM | POA: Diagnosis not present

## 2016-01-27 LAB — CBC
HEMATOCRIT: 34.2 % — AB (ref 36.0–46.0)
Hemoglobin: 12.3 g/dL (ref 12.0–15.0)
MCH: 40.9 pg — ABNORMAL HIGH (ref 26.0–34.0)
MCHC: 36 g/dL (ref 30.0–36.0)
MCV: 113.6 fL — ABNORMAL HIGH (ref 78.0–100.0)
Platelets: 203 10*3/uL (ref 150–400)
RBC: 3.01 MIL/uL — ABNORMAL LOW (ref 3.87–5.11)
RDW: 14.6 % (ref 11.5–15.5)
WBC: 6.8 10*3/uL (ref 4.0–10.5)

## 2016-01-27 LAB — BASIC METABOLIC PANEL
Anion gap: 9 (ref 5–15)
BUN: 7 mg/dL (ref 6–20)
CO2: 28 mmol/L (ref 22–32)
Calcium: 8.8 mg/dL — ABNORMAL LOW (ref 8.9–10.3)
Chloride: 97 mmol/L — ABNORMAL LOW (ref 101–111)
Creatinine, Ser: 0.34 mg/dL — ABNORMAL LOW (ref 0.44–1.00)
GFR calc Af Amer: >60 mL/min (ref >60)
GLUCOSE: 111 mg/dL — AB (ref 65–99)
POTASSIUM: 4 mmol/L (ref 3.5–5.1)
Sodium: 134 mmol/L — ABNORMAL LOW (ref 135–145)

## 2016-01-27 LAB — URIC ACID: URIC ACID, SERUM: 4.2 mg/dL (ref 2.3–6.6)

## 2016-01-27 LAB — SEDIMENTATION RATE: SED RATE: 40 mm/h — AB (ref 0–22)

## 2016-01-27 MED ORDER — LORAZEPAM 1 MG PO TABS
1.0000 mg | ORAL_TABLET | Freq: Four times a day (QID) | ORAL | Status: DC | PRN
  Administered 2016-01-27 – 2016-01-28 (×2): 1 mg via ORAL
  Filled 2016-01-27 (×2): qty 1

## 2016-01-27 MED ORDER — HYDROCODONE-ACETAMINOPHEN 5-325 MG PO TABS
1.0000 | ORAL_TABLET | Freq: Four times a day (QID) | ORAL | Status: DC | PRN
  Administered 2016-01-27: 1 via ORAL
  Filled 2016-01-27: qty 1

## 2016-01-27 MED ORDER — LORAZEPAM 1 MG PO TABS
0.0000 mg | ORAL_TABLET | Freq: Four times a day (QID) | ORAL | Status: DC
  Administered 2016-01-27 (×2): 2 mg via ORAL
  Administered 2016-01-28: 1 mg via ORAL
  Filled 2016-01-27 (×3): qty 2

## 2016-01-27 MED ORDER — FOLIC ACID 1 MG PO TABS
1.0000 mg | ORAL_TABLET | Freq: Every day | ORAL | Status: DC
  Administered 2016-01-27: 1 mg via ORAL
  Filled 2016-01-27 (×2): qty 1

## 2016-01-27 MED ORDER — ONDANSETRON HCL 4 MG/2ML IJ SOLN: 4.0000 mg | Freq: Four times a day (QID) | INTRAMUSCULAR | Status: DC | PRN

## 2016-01-27 MED ORDER — ADULT MULTIVITAMIN W/MINERALS CH
1.0000 | ORAL_TABLET | Freq: Every day | ORAL | Status: DC
  Administered 2016-01-27: 1 via ORAL
  Filled 2016-01-27 (×2): qty 1

## 2016-01-27 MED ORDER — ACETAMINOPHEN 650 MG RE SUPP: 650.0000 mg | Freq: Four times a day (QID) | RECTAL | Status: DC | PRN

## 2016-01-27 MED ORDER — THIAMINE HCL 100 MG/ML IJ SOLN
100.0000 mg | Freq: Every day | INTRAMUSCULAR | Status: DC
  Filled 2016-01-27 (×2): qty 1

## 2016-01-27 MED ORDER — LOPERAMIDE HCL 2 MG PO CAPS
2.0000 mg | ORAL_CAPSULE | ORAL | Status: DC | PRN
  Administered 2016-01-27: 2 mg via ORAL
  Filled 2016-01-27 (×2): qty 1

## 2016-01-27 MED ORDER — VITAMIN B-1 100 MG PO TABS
100.0000 mg | ORAL_TABLET | Freq: Every day | ORAL | Status: DC
  Administered 2016-01-27: 100 mg via ORAL
  Filled 2016-01-27 (×2): qty 1

## 2016-01-27 MED ORDER — ACETAMINOPHEN 325 MG PO TABS: 650.0000 mg | ORAL_TABLET | Freq: Four times a day (QID) | ORAL | Status: DC | PRN

## 2016-01-27 MED ORDER — LORAZEPAM 2 MG/ML IJ SOLN: 1.0000 mg | Freq: Four times a day (QID) | INTRAMUSCULAR | Status: DC | PRN

## 2016-01-27 MED ORDER — LORAZEPAM 1 MG PO TABS: 0.0000 mg | ORAL_TABLET | Freq: Two times a day (BID) | ORAL | Status: DC

## 2016-01-27 MED ORDER — HYDROCODONE-ACETAMINOPHEN 5-325 MG PO TABS
1.0000 | ORAL_TABLET | Freq: Four times a day (QID) | ORAL | Status: DC | PRN
Start: 2016-01-27 — End: 2016-01-28
  Administered 2016-01-27: 1 via ORAL
  Administered 2016-01-27 – 2016-01-28 (×5): 2 via ORAL
  Filled 2016-01-27 (×2): qty 2
  Filled 2016-01-27: qty 1
  Filled 2016-01-27 (×3): qty 2

## 2016-01-27 MED ORDER — MORPHINE SULFATE (PF) 2 MG/ML IV SOLN
1.0000 mg | INTRAVENOUS | Status: DC | PRN
  Administered 2016-01-27 (×3): 1 mg via INTRAVENOUS
  Filled 2016-01-27 (×3): qty 1

## 2016-01-27 MED ORDER — ENSURE ENLIVE PO LIQD
237.0000 mL | Freq: Three times a day (TID) | ORAL | Status: DC
  Administered 2016-01-27 – 2016-01-28 (×3): 237 mL via ORAL

## 2016-01-27 MED ORDER — ONDANSETRON HCL 4 MG PO TABS: 4.0000 mg | ORAL_TABLET | Freq: Four times a day (QID) | ORAL | Status: DC | PRN

## 2016-01-27 NOTE — Progress Notes (Signed)
[facility]                                                                              Patient Demographics  Mackenzie [name], is a 70 y.o. female, DOB - [birth year], KCL:[id number]  Admit date - 01/26/2016   Admitting Physician Rise Patience, MD  Outpatient Primary MD for the patient is Lynne Logan, MD  LOS -    Chief Complaint  Patient presents with  . Knee Pain      HPI on 01/26/2016 by Dr. Gean Birchwood DEAUNDRA Mackenzie [name] is a 70 y.o. female history of alcoholism and tobacco abuse presents to the ER because of increasing pain in the right knee. Patient states she had bumped her knee in her kitchen 3 weeks ago following which patient has been finding it increasingly difficult to walk hardly able to put any pressure on the right knee and not able to do her regular daily activities. Patient has been taking bedrest since then. Since then has worsened patient came to the ER. X-rays show right knee effusion. There is swelling of the right lower extremity below the right knee. Dopplers were negative for DVT. X-ray of the right ankle is unremarkable except for osteopenia. Since patient is finding it difficult to walk patient has been admitted for further management for further observation. At this time ER PA is attempting to get arthrocentesis done.    Assessment & Plan   Ambulatory dysfunction/right knee pain -It seems the patient hit her knee approximately 3 weeks ago -X-ray of the right needed show effusion -Right lower extremity Doppler negative for DVT -Arthrocentesis attempted in the emergency department however patient refused -MRI of the right knee pending -Uric acid 4.2 -ESR 40 -Physical therapy consulted recommended SNF  Alcohol abuse -Patient denies an alcohol problem -Alcohol use is inconsistent -Continue CIWA protocol, folic acid, multivitamin, thiamine  Tobacco abuse -Smoking cessation discussed  Code Status: DNI  Family Communication: None at  bedside  Disposition Plan: Admitted. Pending MRI of the right knee, SNF placement  Time Spent in minutes   30 minutes  Procedures  None  Consults   None  DVT Prophylaxis  SCDs  Lab Results  Component Value Date   PLT 203 01/27/2016    Medications  Scheduled Meds: . folic acid  1 mg Oral Daily  . LORazepam  0-4 mg Oral 4 times per day   Followed by  . [START ON 01/29/2016] LORazepam  0-4 mg Oral Q12H  . multivitamin with minerals  1 tablet Oral Daily  . thiamine  100 mg Oral Daily   Or  . thiamine  100 mg Intravenous Daily   Continuous Infusions:  PRN Meds:.acetaminophen **OR** acetaminophen, HYDROcodone-acetaminophen, loperamide, LORazepam **OR** LORazepam, morphine injection, ondansetron **OR** ondansetron (ZOFRAN) IV  Antibiotics    Anti-infectives    None     Subjective:   Mackenzie [name] seen and examined today.  Patient continues to state she has right knee pain. Denies any chest pain, shortness of breath, abdominal pain, nausea or vomiting.  Objective:   Filed Vitals:   01/27/16 0100 01/27/16 0500 01/27/16 0900 01/27/16 1344  BP:  149/75 162/96 126/74  Pulse:  92 84  80  Temp:  99.5 F (37.5 C) 97.7 F (36.5 C) 98.7 F (37.1 C)  TempSrc:  Oral Oral Oral  Resp:  '18 16 13  ' Height: '5\' 8"'  (1.727 m)     Weight: 49.896 kg (110 lb)     SpO2:  97% 98% 97%    Wt Readings from Last 3 Encounters:  01/27/16 49.896 kg (110 lb)  09/13/15 52.164 kg (115 lb)  01/12/14 50.803 kg (112 lb)     Intake/Output Summary (Last 24 hours) at 01/27/16 1537 Last data filed at 01/27/16 0500  Gross per 24 hour  Intake    480 ml  Output      0 ml  Net    480 ml    Exam  General: Well developed, thin, malnourished, NAD  HEENT: NCAT,mucous membranes moist.   Cardiovascular: S1 S2 auscultated, no rubs, murmurs or gallops. Regular rate and rhythm.  Respiratory: Clear to auscultation bilaterally with equal chest rise  Abdomen: Soft, nontender, nondistended, + bowel  sounds  Extremities: warm dry without cyanosis clubbing or edema. No obvious joint effusion of the right knee. No pain with it with palpation of the right knee.  Neuro: AAOx3, Nonfocal  Data Review   Micro Results No results found for this or any previous visit (from the past 240 hour(s)).  Radiology Reports Dg Tibia/fibula Right  01/26/2016  CLINICAL DATA:  Pain and swelling EXAM: RIGHT TIBIA AND FIBULA - 2 VIEW COMPARISON:  Right knee same day FINDINGS: Three views of the right tibia-fibula submitted. No acute fracture or subluxation. There is diffuse osteopenia. IMPRESSION: No acute fracture or subluxation.  Diffuse osteopenia. Electronically Signed   By: Lahoma Crocker M.D.   On: 01/26/2016 17:50   Dg Knee Complete 4 Views Right  01/26/2016  CLINICAL DATA:  Right knee pain for 2 weeks. No known injury. Initial encounter. EXAM: RIGHT KNEE - COMPLETE 4+ VIEW COMPARISON:  None. FINDINGS: No acute bony abnormality is seen. Bones are markedly osteopenic. Joint spaces are preserved. Small joint effusion is noted. IMPRESSION: Negative for fracture. Small joint effusion. Osteopenia. Electronically Signed   By: Inge Rise M.D.   On: 01/26/2016 17:49    CBC  Recent Labs Lab 01/26/16 1957 01/27/16 0419  WBC 8.2 6.8  HGB 12.8 12.3  HCT 36.0 34.2*  PLT 206 203  MCV 112.5* 113.6*  MCH 40.0* 40.9*  MCHC 35.6 36.0  RDW 14.2 14.6  LYMPHSABS 1.1  --   MONOABS 1.2*  --   EOSABS 0.0  --   BASOSABS 0.0  --     Chemistries   Recent Labs Lab 01/26/16 1957 01/27/16 0419  NA 133* 134*  K 3.8 4.0  CL 95* 97*  CO2 28 28  GLUCOSE 102* 111*  BUN 5* 7  CREATININE 0.35* 0.34*  CALCIUM 8.8* 8.8*  AST 68*  --   ALT 27  --   ALKPHOS 144*  --   BILITOT 1.1  --    ------------------------------------------------------------------------------------------------------------------ estimated creatinine clearance is 53.8 mL/min (by C-G formula based on Cr of  0.34). ------------------------------------------------------------------------------------------------------------------ No results for input(s): HGBA1C in the last 72 hours. ------------------------------------------------------------------------------------------------------------------ No results for input(s): CHOL, HDL, LDLCALC, TRIG, CHOLHDL, LDLDIRECT in the last 72 hours. ------------------------------------------------------------------------------------------------------------------ No results for input(s): TSH, T4TOTAL, T3FREE, THYROIDAB in the last 72 hours.  Invalid input(s): FREET3 ------------------------------------------------------------------------------------------------------------------ No results for input(s): VITAMINB12, FOLATE, FERRITIN, TIBC, IRON, RETICCTPCT in the last 72 hours.  Coagulation profile No results for input(s): INR, PROTIME in the  last 168 hours.  No results for input(s): DDIMER in the last 72 hours.  Cardiac Enzymes No results for input(s): CKMB, TROPONINI, MYOGLOBIN in the last 168 hours.  Invalid input(s): CK ------------------------------------------------------------------------------------------------------------------ Invalid input(s): POCBNP    Jenniger Figiel D.O. on 01/27/2016 at 3:37 PM  Between 7am to 7pm - Pager - [phone]  After 7pm go to [email or web address] - password [email or web address]  And look for the night coverage person covering for me after hours  [facility] Office  [phone]

## 2016-01-27 NOTE — Care Management Obs Status (Signed)
MEDICARE OBSERVATION STATUS NOTIFICATION   Patient Details  Name: Mackenzie [name] MRN: [id number] Date of Birth: 23-Aug-1946   Medicare Observation Status Notification Given:  Yes    Alexis Goodelleele, Khai Arrona K, RN 01/27/2016, 2:36 PM

## 2016-01-27 NOTE — NC FL2 (Signed)
Mackenzie [name] MEDICAID FL2 LEVEL OF CARE SCREENING TOOL     IDENTIFICATION  Patient Name: Mackenzie SinghMartha F [name] Birthdate: 13-Mar-1946 Sex: female Admission Date (Current Location): 01/26/2016  [facility] [place] [place]   Facility and Address:  [facility],  [address], [place] [id number]      Provider Number: [id number]  Attending Physician Name and Address:  Edsel PetrinMaryann Mikhail, DO  Relative Name and Phone Number:       Current Level of Care: Hospital Recommended Level of Care: Skilled Nursing Facility Prior Approval Number:    Date Approved/Denied:   PASRR Number: [id number] [id number]  Discharge Plan: SNF    Current Diagnoses: Patient Active Problem List   Diagnosis Date Noted  . Right knee pain 01/26/2016  . 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 ARTHRALGIA 11/29/2009  . CBC, ABNORMAL 11/29/2009  . VITAMIN D DEFICIENCY 10/21/2009  . SEBACEOUS CYST, NECK 10/18/2009  . CAROTID BRUIT 10/18/2009    Orientation RESPIRATION BLADDER Height & Weight     Self, Time, Situation, Place  Normal Incontinent Weight: 49.896 kg (110 lb) Height:  5\' 8"  (172.7 cm)  BEHAVIORAL SYMPTOMS/MOOD NEUROLOGICAL BOWEL NUTRITION STATUS  Other (Comment) (no behaviors)   Continent Diet  AMBULATORY STATUS COMMUNICATION OF NEEDS Skin   Limited Assist Verbally Normal                       Personal Care Assistance Level of Assistance  Bathing, Feeding, Dressing Bathing Assistance: Limited assistance Feeding assistance: Independent Dressing Assistance: Limited assistance     Functional Limitations Info  Sight, Hearing, Speech Sight Info: Impaired Hearing Info: Adequate Speech Info: Adequate    SPECIAL CARE FACTORS FREQUENCY  PT (By licensed PT), OT (By licensed OT)     PT Frequency: 5 x wk OT Frequency: 5 x wk            Contractures Contractures Info: Not present    Additional Factors Info  Code Status Code Status Info:  Partial Code             Current Medications (01/27/2016):  This is the current hospital active medication list Current Facility-Administered Medications  Medication Dose Route Frequency Provider Last Rate Last Dose  . acetaminophen (TYLENOL) tablet 650 mg  650 mg Oral Q6H PRN Eduard ClosArshad N Kakrakandy, MD       Or  . acetaminophen (TYLENOL) suppository 650 mg  650 mg Rectal Q6H PRN Eduard ClosArshad N Kakrakandy, MD      . folic acid (FOLVITE) tablet 1 mg  1 mg Oral Daily Eduard ClosArshad N Kakrakandy, MD   1 mg at 01/27/16 1014  . HYDROcodone-acetaminophen (NORCO/VICODIN) 5-325 MG per tablet 1-2 tablet  1-2 tablet Oral Q6H PRN Edsel PetrinMaryann Mikhail, DO   1 tablet at 01/27/16 1256  . loperamide (IMODIUM) capsule 2 mg  2 mg Oral PRN Maryann Mikhail, DO   2 mg at 01/27/16 1255  . LORazepam (ATIVAN) tablet 1 mg  1 mg Oral Q6H PRN Eduard ClosArshad N Kakrakandy, MD   1 mg at 01/27/16 0543   Or  . LORazepam (ATIVAN) injection 1 mg  1 mg Intravenous Q6H PRN Eduard ClosArshad N Kakrakandy, MD      . LORazepam (ATIVAN) tablet 0-4 mg  0-4 mg Oral 4 times per day Eduard ClosArshad N Kakrakandy, MD   2 mg at 01/27/16 1256   Followed by  . [START ON 01/29/2016] LORazepam (ATIVAN) tablet 0-4 mg  0-4 mg Oral Q12H Arshad  Demetra Shiner, MD      . morphine 2 MG/ML injection 1 mg  1 mg Intravenous Q4H PRN Eduard Clos, MD   1 mg at 01/27/16 0545  . multivitamin with minerals tablet 1 tablet  1 tablet Oral Daily Eduard Clos, MD   1 tablet at 01/27/16 1014  . ondansetron (ZOFRAN) tablet 4 mg  4 mg Oral Q6H PRN Eduard Clos, MD       Or  . ondansetron [facility]) injection 4 mg  4 mg Intravenous Q6H PRN Eduard Clos, MD      . thiamine (VITAMIN B-1) tablet 100 mg  100 mg Oral Daily Eduard Clos, MD   100 mg at 01/27/16 1014   Or  . thiamine (B-1) injection 100 mg  100 mg Intravenous Daily Eduard Clos, MD         Discharge Medications: Please see discharge summary for a list of discharge medications.  Relevant Imaging  Results:  Relevant Lab Results:   Additional Information SS # [id number]  Mackenzie [name], Dickey Gave, LCSW

## 2016-01-27 NOTE — Progress Notes (Signed)
CSW assisting with d/c planning. Pt is in agreement with d/c to SNF for ST Rehab. SNF search initiated and bed offers are pending. Authorization for SNF [facility]. A decision is pending.   Cori RazorJamie Gemma Ruan LCSW [phone]

## 2016-01-27 NOTE — Progress Notes (Signed)
Initial Nutrition Assessment  DOCUMENTATION CODES:   Non-severe (moderate) malnutrition in context of chronic illness, Underweight  INTERVENTION:   -Provide Ensure Enlive po QID, each supplement provides 350 kcal and 20 grams of protein -Encourage PO intake -RD to continue to monitor  NUTRITION DIAGNOSIS:   Increased nutrient needs related to other (see comment) (ETOH abuse) as evidenced by estimated needs.  GOAL:   Patient will meet greater than or equal to 90% of their needs  MONITOR:   PO intake, Supplement acceptance, Labs, Weight trends, I & O's  REASON FOR ASSESSMENT:   Other (Comment) (Low BMI)    ASSESSMENT:   70 y.o. female history of alcoholism and tobacco abuse presents to the ER because of increasing pain in the right knee. Patient states she had bumped her knee in her kitchen 3 weeks ago following which patient has been finding it increasingly difficult to walk hardly able to put any pressure on the right knee and not able to do her regular daily activities. Patient has been taking bedrest since then. Since then has worsened patient came to the ER. X-rays show right knee effusion. There is swelling of the right lower extremity below the right knee.  Patient in room with no family present. Pt reports good appetite. She states she ate some applesauce, cottage cheese and Ensure for lunch today. States that is what she would like for dinner as well. Pt states no appetite or weight changes recently. States her UBW is 110 lb. Pt with history of ETOH use, 6+ beers daily. Pt states she has chronic diarrhea, has been ordered imodium. Pt refuses vitamins, stating they hurt her stomach.   Per weight history, pt has had progressive weight loss since 2011 but is not significant for time frame. Nutrition-Focused physical exam completed. Findings are moderate fat depletion, severe muscle depletion, and no edema.   Medications: folic acid tablet daily, Multivitamin with minerals  daily, thiamine tablet daily Labs reviewed: Low Na  Diet Order:  Diet regular Room service appropriate?: Yes; Fluid consistency:: Thin  Skin:  Reviewed, no issues  Last BM:  3/25  Height:   Ht Readings from Last 1 Encounters:  01/27/16 5\' 8"  (1.727 m)    Weight:   Wt Readings from Last 1 Encounters:  01/27/16 110 lb (49.896 kg)    Ideal Body Weight:  63.6 kg  BMI:  Body mass index is 16.73 kg/(m^2).  Estimated Nutritional Needs:   Kcal:  1600-1800  Protein:  80-90g  Fluid:  1.8L/day  EDUCATION NEEDS:   No education needs identified at this time  Tilda FrancoLindsey Yee Joss, MS, RD, LDN Pager: [phone] After Hours Pager: [phone]

## 2016-01-27 NOTE — Evaluation (Signed)
Physical Therapy Evaluation Patient Details Name: Mackenzie [name] MRN: [id number] DOB: 04/04/1946 Today's Date: 01/27/2016   History of Present Illness  70 y.o. female with h/o EtOH abuse admitted with R knee pain and difficulty walking x 3 weeks. Dopplers negative for DVT RLE, x ray negative for RLE fx. MRI pending.   Clinical Impression  Pt admitted with above diagnosis. Pt currently with functional limitations due to the deficits listed below (see PT Problem List). Pt requires mod assist for transfers, min A to ambulate 2' + 3' with RW, distance limited by 10/10 RLE pain with weightbearing. She would benefit from ST-SNF as she was not managing well at home PTA and does not have 24* assistance available.  Pt will benefit from skilled PT to increase their independence and safety with mobility to allow discharge to the venue listed below.       Follow Up Recommendations SNF (assistance for mobility and ADLs)    Equipment Recommendations  Wheelchair (measurements PT)    Recommendations for Other Services OT consult     Precautions / Restrictions Precautions Precautions: Fall Restrictions Weight Bearing Restrictions: No      Mobility  Bed Mobility Overal bed mobility: Modified Independent             General bed mobility comments: HOB up 35*, no physical assist  Transfers Overall transfer level: Needs assistance Equipment used: Rolling walker (2 wheeled) Transfers: Sit to/from Stand Sit to Stand: Mod assist;From elevated surface         General transfer comment: VCs hand placement, assist to rise  Ambulation/Gait Ambulation/Gait assistance: Min assist Ambulation Distance (Feet): 5 Feet Assistive device: Rolling walker (2 wheeled) Gait Pattern/deviations: Step-to pattern;Decreased step length - left;Decreased stance time - right;Decreased weight shift to right;Trunk flexed   Gait velocity interpretation: Below normal speed for age/gender General Gait Details: pt  took several pivotal steps bed to [facility] then to recliner with RW, distance limited by R knee pain with weightbearing, VCs for posture and positioning in RW  Stairs            Wheelchair Mobility    Modified Rankin (Stroke Patients Only)       Balance Overall balance assessment: Needs assistance   Sitting balance-Leahy Scale: Good     Standing balance support: Bilateral upper extremity supported Standing balance-Leahy Scale: Poor Standing balance comment: relies on RW due to pain with WB RLE                             Pertinent Vitals/Pain Pain Assessment: 0-10 Pain Score: 10-Worst pain ever Pain Location: R knee Pain Descriptors / Indicators: Sore Pain Intervention(s): Limited activity within patient's tolerance;Monitored during session;Premedicated before session (pt declined ice)    Home Living Family/patient expects to be discharged to:: Private residence Living Arrangements: Alone Available Help at Discharge: Friend(s);Available PRN/intermittently   Home Access: Level entry     Home Layout: Two level;Able to live on main level with bedroom/bathroom Home Equipment: Dan Humphreys - 2 wheels;Bedside commode;Cane - single point      Prior Function Level of Independence: Independent with assistive device(s)         Comments: at baseline walks with [facility], since R knee injury 3 weeks ago has used a RW     Hand Dominance        Extremity/Trunk Assessment   Upper Extremity Assessment: Overall WFL for tasks assessed  Lower Extremity Assessment: RLE deficits/detail RLE Deficits / Details: ankle WNL, knee AAROM 10-110*, knee ext strength -3/5, hip +3/5; limited by pain    Cervical / Trunk Assessment: Kyphotic  Communication   Communication: No difficulties  Cognition Arousal/Alertness: Awake/alert Behavior During Therapy: WFL for tasks assessed/performed Overall Cognitive Status: Within Functional Limits for tasks assessed                       General Comments      Exercises        Assessment/Plan    PT Assessment Patient needs continued PT services  PT Diagnosis Difficulty walking;Generalized weakness;Acute pain   PT Problem List Decreased strength;Decreased range of motion;Decreased activity tolerance;Decreased balance;Decreased mobility;Pain  PT Treatment Interventions Gait training;DME instruction;Functional mobility training;Therapeutic activities;Therapeutic exercise;Balance training;Patient/family education   PT Goals (Current goals can be found in the Care Plan section) Acute Rehab PT Goals Patient Stated Goal: 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 [employer] PT Goal Formulation: With patient Time For Goal Achievement: 02/10/16 Potential to Achieve Goals: Good    Frequency Min 3X/week   Barriers to discharge Decreased caregiver support pt has a friend that can check in but not available 24*    Co-evaluation               End of Session Equipment Utilized During Treatment: Gait belt Activity Tolerance: Patient limited by pain Patient left: in chair;with call bell/phone within reach;with chair alarm set;with nursing/sitter in room Nurse Communication: Mobility status;Patient requests pain meds    Functional Assessment Tool Used: clinical judgement Functional Limitation: Mobility: Walking and moving around Mobility: Walking and Moving Around Current Status [phone](G8978): At least 40 percent but less than 60 percent impaired, limited or restricted Mobility: Walking and Moving Around Goal Status [phone](G8979): At least 1 percent but less than 20 percent impaired, limited or restricted    Time: 0902-0925 PT Time Calculation (min) (ACUTE ONLY): 23 min   Charges:   PT Evaluation $PT Eval Low Complexity: 1 Procedure PT Treatments $Therapeutic Activity: 8-22 mins   PT G Codes:   PT G-Codes **NOT FOR INPATIENT CLASS** Functional Assessment Tool Used: clinical judgement Functional Limitation:  Mobility: Walking and moving around Mobility: Walking and Moving Around Current Status ([id number](G8978): At least 40 percent but less than 60 percent impaired, limited or restricted Mobility: Walking and Moving Around Goal Status [phone](G8979): At least 1 percent but less than 20 percent impaired, limited or restricted    Tamala SerUhlenberg, Winfred Redel Kistler 01/27/2016, 9:36 AM [phone]
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
## 2016-01-28 DIAGNOSIS — R262 Difficulty in walking, not elsewhere classified: Secondary | ICD-10-CM | POA: Diagnosis not present

## 2016-01-28 DIAGNOSIS — F330 Major depressive disorder, recurrent, mild: Secondary | ICD-10-CM | POA: Diagnosis not present

## 2016-01-28 DIAGNOSIS — K219 Gastro-esophageal reflux disease without esophagitis: Secondary | ICD-10-CM | POA: Diagnosis not present

## 2016-01-28 DIAGNOSIS — R2681 Unsteadiness on feet: Secondary | ICD-10-CM | POA: Diagnosis not present

## 2016-01-28 DIAGNOSIS — F411 Generalized anxiety disorder: Secondary | ICD-10-CM | POA: Diagnosis not present

## 2016-01-28 DIAGNOSIS — M6281 Muscle weakness (generalized): Secondary | ICD-10-CM | POA: Diagnosis not present

## 2016-01-28 DIAGNOSIS — F17200 Nicotine dependence, unspecified, uncomplicated: Secondary | ICD-10-CM

## 2016-01-28 DIAGNOSIS — G4700 Insomnia, unspecified: Secondary | ICD-10-CM | POA: Diagnosis not present

## 2016-01-28 DIAGNOSIS — Z789 Other specified health status: Secondary | ICD-10-CM | POA: Diagnosis not present

## 2016-01-28 DIAGNOSIS — R278 Other lack of coordination: Secondary | ICD-10-CM | POA: Diagnosis not present

## 2016-01-28 DIAGNOSIS — E440 Moderate protein-calorie malnutrition: Secondary | ICD-10-CM | POA: Insufficient documentation

## 2016-01-28 DIAGNOSIS — M25569 Pain in unspecified knee: Secondary | ICD-10-CM

## 2016-01-28 DIAGNOSIS — R195 Other fecal abnormalities: Secondary | ICD-10-CM | POA: Diagnosis not present

## 2016-01-28 DIAGNOSIS — S82141A Displaced bicondylar fracture of right tibia, initial encounter for closed fracture: Secondary | ICD-10-CM | POA: Diagnosis not present

## 2016-01-28 DIAGNOSIS — F418 Other specified anxiety disorders: Secondary | ICD-10-CM | POA: Diagnosis not present

## 2016-01-28 DIAGNOSIS — S82144D Nondisplaced bicondylar fracture of right tibia, subsequent encounter for closed fracture with routine healing: Secondary | ICD-10-CM | POA: Diagnosis not present

## 2016-01-28 DIAGNOSIS — M8580 Other specified disorders of bone density and structure, unspecified site: Secondary | ICD-10-CM | POA: Diagnosis not present

## 2016-01-28 DIAGNOSIS — F419 Anxiety disorder, unspecified: Secondary | ICD-10-CM | POA: Diagnosis not present

## 2016-01-28 DIAGNOSIS — S82109A Unspecified fracture of upper end of unspecified tibia, initial encounter for closed fracture: Secondary | ICD-10-CM

## 2016-01-28 DIAGNOSIS — M25561 Pain in right knee: Secondary | ICD-10-CM | POA: Diagnosis not present

## 2016-01-28 DIAGNOSIS — F1010 Alcohol abuse, uncomplicated: Secondary | ICD-10-CM | POA: Diagnosis not present

## 2016-01-28 DIAGNOSIS — S82191A Other fracture of upper end of right tibia, initial encounter for closed fracture: Secondary | ICD-10-CM | POA: Diagnosis not present

## 2016-01-28 DIAGNOSIS — E46 Unspecified protein-calorie malnutrition: Secondary | ICD-10-CM | POA: Diagnosis not present

## 2016-01-28 DIAGNOSIS — S82871D Displaced pilon fracture of right tibia, subsequent encounter for closed fracture with routine healing: Secondary | ICD-10-CM | POA: Diagnosis not present

## 2016-01-28 LAB — BASIC METABOLIC PANEL
Anion gap: 9 (ref 5–15)
BUN: 9 mg/dL (ref 4–21)
BUN: 9 mg/dL (ref 6–20)
CALCIUM: 8.6 mg/dL — AB (ref 8.9–10.3)
CO2: 29 mmol/L (ref 22–32)
CREATININE: 0.4 mg/dL — AB (ref 0.5–1.1)
CREATININE: 0.41 mg/dL — AB (ref 0.44–1.00)
Chloride: 97 mmol/L — ABNORMAL LOW (ref 101–111)
GFR calc non Af Amer: >60 mL/min (ref >60)
Glucose, Bld: 97 mg/dL (ref 65–99)
Glucose: 97 mg/dL
Potassium: 3.9 mmol/L (ref 3.5–5.1)
SODIUM: 135 mmol/L — AB (ref 137–147)
SODIUM: 135 mmol/L (ref 135–145)

## 2016-01-28 LAB — CBC
HCT: 34.4 % — ABNORMAL LOW (ref 36.0–46.0)
Hemoglobin: 12.3 g/dL (ref 12.0–15.0)
MCH: 41.6 pg — ABNORMAL HIGH (ref 26.0–34.0)
MCHC: 35.8 g/dL (ref 30.0–36.0)
MCV: 116.2 fL — ABNORMAL HIGH (ref 78.0–100.0)
Platelets: 215 10*3/uL (ref 150–400)
RBC: 2.96 MIL/uL — ABNORMAL LOW (ref 3.87–5.11)
RDW: 14.6 % (ref 11.5–15.5)
WBC: 7.7 10*3/uL (ref 4.0–10.5)

## 2016-01-28 LAB — CBC AND DIFFERENTIAL: WBC: 7.7 10^3/mL

## 2016-01-28 MED ORDER — FOLIC ACID 1 MG PO TABS: 1.0000 mg | ORAL_TABLET | Freq: Every day | ORAL | Status: DC

## 2016-01-28 MED ORDER — THIAMINE HCL 100 MG PO TABS: 100.0000 mg | ORAL_TABLET | Freq: Every day | ORAL | Status: DC

## 2016-01-28 MED ORDER — ENSURE ENLIVE PO LIQD: 237.0000 mL | Freq: Three times a day (TID) | ORAL | Status: DC

## 2016-01-28 MED ORDER — HYDROCODONE-ACETAMINOPHEN 5-325 MG PO TABS: 1.0000 | ORAL_TABLET | Freq: Four times a day (QID) | ORAL | Status: DC | PRN

## 2016-01-28 NOTE — Clinical Social Work Placement (Signed)
   CLINICAL SOCIAL WORK PLACEMENT  NOTE  Date:  01/28/2016  Patient Details  Name: Mackenzie SinghMartha F [name] MRN: [id number] Date of Birth: 1946/04/09  Clinical Social Work is seeking post-discharge placement for this patient at the Skilled  Nursing Facility level of care (*CSW will initial, date and re-position this form in  chart as items are completed):  Yes   Patient/family provided with [facility] Clinical Social Work Department's list of facilities offering this level of care within the geographic area requested by the patient (or if unable, by the patient's family).  Yes   Patient/family informed of their freedom to choose among providers that offer the needed level of care, that participate in Medicare, Medicaid or managed care program needed by the patient, have an available bed and are willing to accept the patient.  Yes   Patient/family informed of [facility]'s ownership interest in [facility] and [facility], as well as of the fact that they are under no obligation to receive care at these facilities.  PASRR submitted to EDS on 01/28/16     PASRR number received on 01/28/16     Existing PASRR number confirmed on       FL2 transmitted to all facilities in geographic area requested by pt/family on 01/28/16     FL2 transmitted to all facilities within larger geographic area on       Patient informed that his/her managed care company has contracts with or will negotiate with certain facilities, including the following:        Yes   Patient/family informed of bed offers received.  [facility]     Physician recommends and patient chooses bed at      [facility]  .  Patient to be transferred to facility by       Patient family notified on   of transfer.  Name of family member notified:        PHYSICIAN       Additional Comment:    _______________________________________________ Royetta AsalHaidinger, Beckey Polkowski Lee, LCSW   [phone] 01/28/2016, 2:01 PM

## 2016-01-28 NOTE — Progress Notes (Signed)
[facility]                                                                              Patient Demographics  Mackenzie [name], is a 70 y.o. female, DOB - 18-Mar-1946, AST:[id number]  Admit date - 01/26/2016   Admitting Physician Rise Patience, MD  Outpatient Primary MD for the patient is Lynne Logan, MD  LOS -    Chief Complaint  Patient presents with  . Knee Pain      HPI on 01/26/2016 by Dr. Gean Birchwood Mackenzie [name] is a 70 y.o. female history of alcoholism and tobacco abuse presents to the ER because of increasing pain in the right knee. Patient states she had bumped her knee in her kitchen 3 weeks ago following which patient has been finding it increasingly difficult to walk hardly able to put any pressure on the right knee and not able to do her regular daily activities. Patient has been taking bedrest since then. Since then has worsened patient came to the ER. X-rays show right knee effusion. There is swelling of the right lower extremity below the right knee. Dopplers were negative for DVT. X-ray of the right ankle is unremarkable except for osteopenia. Since patient is finding it difficult to walk patient has been admitted for further management for further observation. At this time ER PA is attempting to get arthrocentesis done.    Assessment & Plan   Ambulatory dysfunction/right knee pain- lateral tibial plateau fracture -It seems the patient hit her knee approximately 3 weeks ago -X-ray of the right needed show effusion -Right lower extremity Doppler negative for DVT -Arthrocentesis attempted in the emergency department however patient refused -MRI of the right knee: Possible insufficiency fracture of the lateral tibial plateau with mild depression of the articular surface, small joint effusion with edema in the popliteal fossa -Uric acid 4.2 -ESR 40 -Physical therapy consulted recommended SNF -Spoke with orthopedics, Dr. Alvan Dame, who recommended  nonweightbearing for 6 weeks. Follow-up in the office in 4 weeks with repeat x-rays at that time.  Alcohol abuse -Patient denies an alcohol problem -Alcohol use is inconsistent -Continue CIWA protocol, folic acid, multivitamin, thiamine  Tobacco abuse -Smoking cessation discussed  Code Status: DNI  Family Communication: None at bedside  Disposition Plan: Admitted. Pending  SNF placement  Time Spent in minutes   30 minutes  Procedures  None  Consults   Orthopedic Surgery, Dr. Alvan Dame, via phone  DVT Prophylaxis  SCDs  Lab Results  Component Value Date   PLT 215 01/28/2016    Medications  Scheduled Meds: . feeding supplement (ENSURE ENLIVE)  237 mL Oral TID WC & HS  . folic acid  1 mg Oral Daily  . LORazepam  0-4 mg Oral 4 times per day   Followed by  . [START ON 01/29/2016] LORazepam  0-4 mg Oral Q12H  . multivitamin with minerals  1 tablet Oral Daily  . thiamine  100 mg Oral Daily   Or  . thiamine  100 mg Intravenous Daily   Continuous Infusions:  PRN Meds:.acetaminophen **OR** acetaminophen, HYDROcodone-acetaminophen, loperamide, LORazepam **OR** LORazepam, morphine injection, ondansetron **OR** ondansetron (ZOFRAN) IV  Antibiotics  Anti-infectives    None     Subjective:   Mackenzie [name] seen and examined today.  Patient continues to complain of pain in her knee as well as all over today. Does not feel well.  Denies chest pain, shortness of breath, abdominal pain.  Objective:   Filed Vitals:   01/28/16 0000 01/28/16 0240 01/28/16 0438 01/28/16 1202  BP:   142/90 158/82  Pulse: 84 80 91 94  Temp:   98.1 F (36.7 C)   TempSrc:   Oral   Resp:   15   Height:      Weight:      SpO2:   95%     Wt Readings from Last 3 Encounters:  01/27/16 49.896 kg (110 lb)  01/27/16 49.896 kg (110 lb)  09/13/15 52.164 kg (115 lb)     Intake/Output Summary (Last 24 hours) at 01/28/16 1218 Last data filed at 01/28/16 1203  Gross per 24 hour  Intake    560 ml    Output      0 ml  Net    560 ml    Exam  General: Well developed, thin, malnourished, NAD  HEENT: NCAT,mucous membranes moist.   Cardiovascular: S1 S2 auscultated, no murmurs, RRR  Respiratory: Clear to auscultation bilaterally   Abdomen: Soft, nontender, nondistended, + bowel sounds  Extremities: warm dry without cyanosis clubbing or edema.   Neuro: AAOx3, Nonfocal  Data Review   Micro Results No results found for this or any previous visit (from the past 240 hour(s)).  Radiology Reports Dg Tibia/fibula Right  01/26/2016  CLINICAL DATA:  Pain and swelling EXAM: RIGHT TIBIA AND FIBULA - 2 VIEW COMPARISON:  Right knee same day FINDINGS: Three views of the right tibia-fibula submitted. No acute fracture or subluxation. There is diffuse osteopenia. IMPRESSION: No acute fracture or subluxation.  Diffuse osteopenia. Electronically Signed   By: Lahoma Crocker M.D.   On: 01/26/2016 17:50   Mr Knee Right Wo Contrast  01/28/2016  CLINICAL DATA:  Increasing knee pain, swelling and limited weight-bearing following minor injury 3 weeks ago. EXAM: MRI OF THE RIGHT KNEE WITHOUT CONTRAST TECHNIQUE: Multiplanar, multisequence MR imaging of the knee was performed. No intravenous contrast was administered. COMPARISON:  Radiographs 01/26/2016. FINDINGS: MENISCI Medial meniscus:  Intact with normal morphology. Lateral meniscus:  Intact with normal morphology. LIGAMENTS Cruciates:  Intact. Collaterals:  Intact. CARTILAGE Patellofemoral:  Preserved. Medial:  Mild chondral thinning without focal defect. Lateral: Mild chondral thinning without focal defect. There is a mildly depressed fracture of the lateral tibial plateau, further described below. OTHER Joint:  Small joint effusion. Popliteal Fossa: There is posterior soft tissue edema with mildly prominent fluid in the popliteus hiatus. No significant Baker's cyst. Extensor Mechanism:  Intact. Bones: In correlation with the prior radiographs, the bones are  diffusely demineralized. There is edema throughout the lateral tibial plateau. There is a mildly depressed fracture of the lateral tibial plateau with a cortical component, best seen on the coronal images. There is up to 2 mm of depression of the articular surface. This is likely an insufficiency fracture. Although the underlying marrow is diffusely heterogeneous, a pathologic fracture is felt to be unlikely. There is a probable incidental lipoma within the distal femoral metaphysis. IMPRESSION: 1. Probable insufficiency fracture of the lateral tibial plateau with mild depression of the articular surface. Radiographic follow up after mobilization recommended to document healing and exclude a pathologic component (not strongly suspected). 2. Small joint effusion with edema in the  popliteal fossa. 3. Intact menisci, cruciate and collateral ligaments. Electronically Signed   By: Richardean Sale M.D.   On: 01/28/2016 07:45   Dg Knee Complete 4 Views Right  01/26/2016  CLINICAL DATA:  Right knee pain for 2 weeks. No known injury. Initial encounter. EXAM: RIGHT KNEE - COMPLETE 4+ VIEW COMPARISON:  None. FINDINGS: No acute bony abnormality is seen. Bones are markedly osteopenic. Joint spaces are preserved. Small joint effusion is noted. IMPRESSION: Negative for fracture. Small joint effusion. Osteopenia. Electronically Signed   By: Inge Rise M.D.   On: 01/26/2016 17:49    CBC  Recent Labs Lab 01/26/16 1957 01/27/16 0419 01/28/16 0407  WBC 8.2 6.8 7.7  HGB 12.8 12.3 12.3  HCT 36.0 34.2* 34.4*  PLT 206 203 215  MCV 112.5* 113.6* 116.2*  MCH 40.0* 40.9* 41.6*  MCHC 35.6 36.0 35.8  RDW 14.2 14.6 14.6  LYMPHSABS 1.1  --   --   MONOABS 1.2*  --   --   EOSABS 0.0  --   --   BASOSABS 0.0  --   --     Chemistries   Recent Labs Lab 01/26/16 1957 01/27/16 0419 01/28/16 0407  NA 133* 134* 135  K 3.8 4.0 3.9  CL 95* 97* 97*  CO2 _0 GLUCOSE 102* 111* 97  BUN 5* 7 9  CREATININE 0.35*  0.34* 0.41*  CALCIUM 8.8* 8.8* 8.6*  AST 68*  --   --   ALT 27  --   --   ALKPHOS 144*  --   --   BILITOT 1.1  --   --    ------------------------------------------------------------------------------------------------------------------ estimated creatinine clearance is 53.8 mL/min (by C-G formula based on Cr of 0.41). ------------------------------------------------------------------------------------------------------------------ No results for input(s): HGBA1C in the last 72 hours. ------------------------------------------------------------------------------------------------------------------ No results for input(s): CHOL, HDL, LDLCALC, TRIG, CHOLHDL, LDLDIRECT in the last 72 hours. ------------------------------------------------------------------------------------------------------------------ No results for input(s): TSH, T4TOTAL, T3FREE, THYROIDAB in the last 72 hours.  Invalid input(s): FREET3 ------------------------------------------------------------------------------------------------------------------ No results for input(s): VITAMINB12, FOLATE, FERRITIN, TIBC, IRON, RETICCTPCT in the last 72 hours.  Coagulation profile No results for input(s): INR, PROTIME in the last 168 hours.  No results for input(s): DDIMER in the last 72 hours.  Cardiac Enzymes No results for input(s): CKMB, TROPONINI, MYOGLOBIN in the last 168 hours.  Invalid input(s): CK ------------------------------------------------------------------------------------------------------------------ Invalid input(s): POCBNP    Ermel Verne D.O. on 01/28/2016 at 12:18 PM  Between 7am to 7pm - Pager - [phone]  After 7pm go to [email or web address] - password [email or web address]  And look for the night coverage person covering for me after hours  [facility] Office  [phone]
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
## 2016-01-28 NOTE — Discharge Summary (Signed)
Physician Discharge Summary  Mackenzie [name] HWT:[id number] DOB: 21-Mar-1946 DOA: 01/26/2016  PCP: Lynne Logan, MD  Admit date: 01/26/2016 Discharge date: 01/28/2016  Time spent: 45 minutes  Recommendations for Outpatient Follow-up:  Patient will be discharged to [facility].  Patient will need to be non-weight bearing for 6 weeks. Patient will need to follow up with primary care provider within one week of discharge.  Follow up with Dr. Alvan Dame, orthopedist, in 4 weeks.   Patient should continue medications as prescribed.  Patient should follow a regular diet.   Discharge Diagnoses:  Ambulatory dysfunction/right knee pain-lateral tibial plateau fracture Alcohol abuse Tobacco abuse Moderate malnutrition  Discharge Condition: Stable  Diet recommendation: regular  Filed Weights   01/27/16 0100  Weight: 49.896 kg (110 lb)    History of present illness:  HPI on 01/26/2016 by Dr. Gean [name] Mackenzie [name] is a 70 y.o. female history of alcoholism and tobacco abuse presents to the ER because of increasing pain in the right knee. Patient states she had bumped her knee in her kitchen 3 weeks ago following which patient has been finding it increasingly difficult to walk hardly able to put any pressure on the right knee and not able to do her regular daily activities. Patient has been taking bedrest since then. Since then has worsened patient came to the ER. X-rays show right knee effusion. There is swelling of the right lower extremity below the right knee. Dopplers were negative for DVT. X-ray of the right ankle is unremarkable except for osteopenia. Since patient is finding it difficult to walk patient has been admitted for further management for further observation. At this time ER PA is attempting to get arthrocentesis done.   Hospital Course:  Ambulatory dysfunction/right knee pain- lateral tibial plateau fracture -It seems the patient hit her knee approximately 3 weeks  ago -X-ray of the right needed show effusion -Right lower extremity Doppler negative for DVT -Arthrocentesis attempted in the emergency department however patient refused -MRI of the right knee: Possible insufficiency fracture of the lateral tibial plateau with mild depression of the articular surface, small joint effusion with edema in the popliteal fossa -Uric acid 4.2 -ESR 40 -Physical therapy consulted recommended SNF -Spoke with orthopedics, Dr. Alvan Dame, who recommended nonweightbearing for 6 weeks. Follow-up in the office in 4 weeks with repeat x-rays at that time.  Alcohol abuse -Patient denies an alcohol problem -Alcohol use is inconsistent -Continue CIWA protocol, folic acid, multivitamin, thiamine  Tobacco abuse -Smoking cessation discussed  Moderate malnutrition -Nutrition consulted, continue feeding supplements  Code Status: DNI  Procedures  None  Consults  Orthopedic Surgery, Dr. Alvan Dame, via phone  Discharge Exam: Filed Vitals:   01/28/16 1202 01/28/16 1326  BP: 158/82 131/74  Pulse: 94 87  Temp:  99.5 F (37.5 C)  Resp:  16   Exam  General: Well developed, thin, malnourished, NAD  HEENT: NCAT,mucous membranes moist.   Cardiovascular: S1 S2 auscultated, no murmurs, RRR  Respiratory: Clear to auscultation bilaterally  Abdomen: Soft, nontender, nondistended, + bowel sounds  Extremities: warm dry without cyanosis clubbing or edema.  Neuro: AAOx3, Nonfocal  Discharge Instructions      Discharge Instructions    Discharge instructions    Complete by:  As directed   Patient will be discharged to [facility].  Patient will need to be non-weight bearing for 6 weeks. Patient will need to follow up with primary care provider within one week of discharge.  Follow up with Dr.  Alvan Dame, orthopedist, in 4 weeks.   Patient should continue medications as prescribed.  Patient should follow a regular diet.            Medication List    TAKE these  medications        feeding supplement (ENSURE ENLIVE) Liqd  Take 237 mLs by mouth 4 (four) times daily -  with meals and at bedtime.     folic acid 1 MG tablet  Commonly known as:  FOLVITE  Take 1 tablet (1 mg total) by mouth daily.     HYDROcodone-acetaminophen 5-325 MG tablet  Commonly known as:  NORCO/VICODIN  Take 1-2 tablets by mouth every 6 (six) hours as needed for severe pain.     loperamide 2 MG capsule  Commonly known as:  IMODIUM  Take 2 mg by mouth as needed for diarrhea or loose stools.     ranitidine 75 MG tablet  Commonly known as:  ZANTAC  Take 75 mg by mouth 2 (two) times daily as needed (stomach ache). For indigestion     thiamine 100 MG tablet  Take 1 tablet (100 mg total) by mouth daily.       No Known Allergies Follow-up Information    Follow up with Mauri Pole, MD. Schedule an appointment as soon as possible for a visit in 4 weeks.   Specialty:  Orthopedic Surgery   Why:  Right tibial plateau fracture   Contact information:   [address] [place] [address] [phone]       Follow up with Lynne Logan, MD. Schedule an appointment as soon as possible for a visit in 1 week.   Specialty:  Family Medicine   Why:  Hospital follow up   Contact information:   [place] [address] [phone]        The results of significant diagnostics from this hospitalization (including imaging, microbiology, ancillary and laboratory) are listed below for reference.    Significant Diagnostic Studies: Dg Tibia/fibula Right  01/26/2016  CLINICAL DATA:  Pain and swelling EXAM: RIGHT TIBIA AND FIBULA - 2 VIEW COMPARISON:  Right knee same day FINDINGS: Three views of the right tibia-fibula submitted. No acute fracture or subluxation. There is diffuse osteopenia. IMPRESSION: No acute fracture or subluxation.  Diffuse osteopenia. Electronically Signed   By: Lahoma Crocker M.D.   On: 01/26/2016 17:50   Mr Knee Right  Wo Contrast  01/28/2016  CLINICAL DATA:  Increasing knee pain, swelling and limited weight-bearing following minor injury 3 weeks ago. EXAM: MRI OF THE RIGHT KNEE WITHOUT CONTRAST TECHNIQUE: Multiplanar, multisequence MR imaging of the knee was performed. No intravenous contrast was administered. COMPARISON:  Radiographs 01/26/2016. FINDINGS: MENISCI Medial meniscus:  Intact with normal morphology. Lateral meniscus:  Intact with normal morphology. LIGAMENTS Cruciates:  Intact. Collaterals:  Intact. CARTILAGE Patellofemoral:  Preserved. Medial:  Mild chondral thinning without focal defect. Lateral: Mild chondral thinning without focal defect. There is a mildly depressed fracture of the lateral tibial plateau, further described below. OTHER Joint:  Small joint effusion. Popliteal Fossa: There is posterior soft tissue edema with mildly prominent fluid in the popliteus hiatus. No significant Baker's cyst. Extensor Mechanism:  Intact. Bones: In correlation with the prior radiographs, the bones are diffusely demineralized. There is edema throughout the lateral tibial plateau. There is a mildly depressed fracture of the lateral tibial plateau with a cortical component, best seen on the coronal images. There is up to 2 mm of  depression of the articular surface. This is likely an insufficiency fracture. Although the underlying marrow is diffusely heterogeneous, a pathologic fracture is felt to be unlikely. There is a probable incidental lipoma within the distal femoral metaphysis. IMPRESSION: 1. Probable insufficiency fracture of the lateral tibial plateau with mild depression of the articular surface. Radiographic follow up after mobilization recommended to document healing and exclude a pathologic component (not strongly suspected). 2. Small joint effusion with edema in the popliteal fossa. 3. Intact menisci, cruciate and collateral ligaments. Electronically Signed   By: Richardean Sale M.D.   On: 01/28/2016 07:45   Dg  Knee Complete 4 Views Right  01/26/2016  CLINICAL DATA:  Right knee pain for 2 weeks. No known injury. Initial encounter. EXAM: RIGHT KNEE - COMPLETE 4+ VIEW COMPARISON:  None. FINDINGS: No acute bony abnormality is seen. Bones are markedly osteopenic. Joint spaces are preserved. Small joint effusion is noted. IMPRESSION: Negative for fracture. Small joint effusion. Osteopenia. Electronically Signed   By: Inge Rise M.D.   On: 01/26/2016 17:49    Microbiology: No results found for this or any previous visit (from the past 240 hour(s)).   Labs: Basic Metabolic Panel:  Recent Labs Lab 01/26/16 1957 01/27/16 0419 01/28/16 0407  NA 133* 134* 135  K 3.8 4.0 3.9  CL 95* 97* 97*  CO2 '28 28 29  ' GLUCOSE 102* 111* 97  BUN 5* 7 9  CREATININE 0.35* 0.34* 0.41*  CALCIUM 8.8* 8.8* 8.6*   Liver Function Tests:  Recent Labs Lab 01/26/16 1957  AST 68*  ALT 27  ALKPHOS 144*  BILITOT 1.1  PROT 7.6  ALBUMIN 3.3*   No results for input(s): LIPASE, AMYLASE in the last 168 hours. No results for input(s): AMMONIA in the last 168 hours. CBC:  Recent Labs Lab 01/26/16 1957 01/27/16 0419 01/28/16 0407  WBC 8.2 6.8 7.7  NEUTROABS 5.9  --   --   HGB 12.8 12.3 12.3  HCT 36.0 34.2* 34.4*  MCV 112.5* 113.6* 116.2*  PLT 206 203 215   Cardiac Enzymes: No results for input(s): CKTOTAL, CKMB, CKMBINDEX, TROPONINI in the last 168 hours. BNP: BNP (last 3 results) No results for input(s): BNP in the last 8760 hours.  ProBNP (last 3 results) No results for input(s): PROBNP in the last 8760 hours.  CBG: No results for input(s): GLUCAP in the last 168 hours.     SignedCristal Ford  [facility] 01/28/2016, 2:09 PM

## 2016-01-28 NOTE — Progress Notes (Signed)
PT Cancellation Note  Patient Details Name: Mackenzie SinghMartha F [name] MRN: [id number] DOB: June 17, 1946   Cancelled Treatment:    Reason Eval/Treat Not Completed: Medical issues which prohibited therapy (MRI showed tibial plateau fx, will await ortho plan/weight bearing status prior to resuming PT. )   Tamala SerUhlenberg, Micheala Morissette Kistler 01/28/2016, 10:39 AM [phone]

## 2016-01-28 NOTE — Clinical Social Work Placement (Signed)
   CLINICAL SOCIAL WORK PLACEMENT  NOTE  Date:  01/28/2016  Patient Details  Name: Mackenzie [name] MRN: [id number] Date of Birth: [birth year]  Clinical Social Work is seeking post-discharge placement for this patient at the Skilled  Nursing Facility level of care (*CSW will initial, date and re-position this form in  chart as items are completed):  Yes   Patient/family provided with [facility] Clinical Social Work Department's list of facilities offering this level of care within the geographic area requested by the patient (or if unable, by the patient's family).  Yes   Patient/family informed of their freedom to choose among providers that offer the needed level of care, that participate in Medicare, Medicaid or managed care program needed by the patient, have an available bed and are willing to accept the patient.  Yes   Patient/family informed of [facility]'s ownership interest in [facility] and [facility], as well as of the fact that they are under no obligation to receive care at these facilities.  PASRR submitted to EDS on 01/28/16     PASRR number received on 01/28/16     Existing PASRR number confirmed on       FL2 transmitted to all facilities in geographic area requested by pt/family on 01/28/16     FL2 transmitted to all facilities within larger geographic area on       Patient informed that his/her managed care company has contracts with or will negotiate with certain facilities, including the following:        Yes   Patient/family informed of bed offers received.  [facility]     Physician recommends and patient chooses bed at      Patient to be [facility] 01[facility].  Patient to be transferred to facility by [facility]     Patient family notified on 01/28/16 of transfer.  Name of family member notified:  Friend notified at pt's request     PHYSICIAN       Additional Comment: Pt is in agreement with d/c to  [facility] today. [facility] transport required. Medical necessity form completed. [employer] has authorized SNF placement. Ambulance authorization requested. Pt's friend, [name] notified of pt's d/c. Pt requested that her daughter not be notified of d/c plans.   _______________________________________________ Royetta AsalHaidinger, Francesa Eugenio Lee, LCSW  [phone] 01/28/2016, 2:32 PM

## 2016-01-28 NOTE — Discharge Instructions (Signed)

## 2016-01-30 ENCOUNTER — Non-Acute Institutional Stay (SKILLED_NURSING_FACILITY): Payer: Medicare Other | Admitting: Internal Medicine

## 2016-01-30 ENCOUNTER — Encounter: Payer: Self-pay | Admitting: Internal Medicine

## 2016-01-30 DIAGNOSIS — R195 Other fecal abnormalities: Secondary | ICD-10-CM

## 2016-01-30 DIAGNOSIS — S82871D Displaced pilon fracture of right tibia, subsequent encounter for closed fracture with routine healing: Secondary | ICD-10-CM | POA: Diagnosis not present

## 2016-01-30 DIAGNOSIS — K219 Gastro-esophageal reflux disease without esophagitis: Secondary | ICD-10-CM

## 2016-01-30 DIAGNOSIS — R2681 Unsteadiness on feet: Secondary | ICD-10-CM

## 2016-01-30 DIAGNOSIS — E46 Unspecified protein-calorie malnutrition: Secondary | ICD-10-CM | POA: Diagnosis not present

## 2016-01-30 DIAGNOSIS — F411 Generalized anxiety disorder: Secondary | ICD-10-CM | POA: Diagnosis not present

## 2016-01-30 DIAGNOSIS — Z789 Other specified health status: Secondary | ICD-10-CM

## 2016-01-30 DIAGNOSIS — Z7289 Other problems related to lifestyle: Secondary | ICD-10-CM

## 2016-01-30 NOTE — Progress Notes (Signed)
LOCATION: [facility]  PCP: [name], MD   Code Status: Full Code  Goals of care: Advanced Directive information Advanced Directives 01/26/2016  Does patient have an advance directive? No  Would patient like information on creating an advanced directive? -       Extended Emergency Contact Information Primary Emergency Contact: Marcial Pacas [place] Home Phone: [phone] Relation: Other Secondary Emergency Contact: Johnson,Yvonne Address: [address]          [place], [place] [address] [place] of [employer] Phone: [phone] Relation: Friend   No Known Allergies  Chief Complaint  Patient presents with  . New Admit To SNF    New Admission     HPI:  Patient is a 70 y.o. female seen today for short term rehabilitation post hospital admission from 01/26/16-01/28/16 with right knee pain and swelling of her knee and right leg. DVT was ruled out. X ray revealed right knee effusion and MRI revealed right lateral tibial plateau fracture. Orthopedic was consulted and NWB to RLE for 6 weeks and pain management was recommended. She has history of alcohol and tobacco use. She is seen in her room today. Her pain is not under control with current pain regimen. She mentions being on percocet before and that helped better with her pain. Denies muscle spasm. She grades her pain as 10/10. She has been anxious about her home situation at time of discharge from the facility and would like to talk with somebody.   Review of Systems:  Constitutional: Negative for fever, chills, diaphoresis. Energy is low.  HENT: Negative for congestion, hearing loss and difficulty swallowing. Positive for frontal headache, sore throat and nasal discharge.   Eyes: Negative for blurred vision, double vision and discharge. Wears glasses.  Respiratory: Negative for cough, shortness of breath and wheezing.   Cardiovascular: Negative for chest pain, palpitations, leg  swelling. Positive for swelling in feet.  Gastrointestinal: Negative for heartburn,  vomiting, abdominal pain. Positive for some nausea with exertion and indigestion. Last bowel movement was yesterday and it was somewhat loose. She request imodium. Genitourinary: Negative for dysuria and flank pain.  Musculoskeletal: Negative for back pain, fall in the facility.  Skin: Negative for itching, rash.  Neurological: positive for generalized weakness. Negative for dizziness. Has some issues with her balance. Psychiatric/Behavioral: Negative for depression. Has anxiety.    Past Medical History  Diagnosis Date  . Vein, varicose   . Arthritis    Past Surgical History  Procedure Laterality Date  . Hip surgery    . Joint replacement     Social History:   reports that she quit smoking about 3 years ago. She has never used smokeless tobacco. She reports that she drinks alcohol. She reports that she does not use illicit drugs.  Family History  Problem Relation Age of Onset  . Adopted: Yes    Medications:   Medication List       This list is accurate as of: 01/30/16  1:05 PM.  Always use your most recent med list.               folic acid 1 MG tablet  Commonly known as:  FOLVITE  Take 1 tablet (1 mg total) by mouth daily.     HYDROcodone-acetaminophen 5-325 MG tablet  Commonly known as:  NORCO/VICODIN  Take 1-2 tablets by mouth every 6 (six) hours as needed for severe pain.     loperamide 2 MG capsule  Commonly known as:  IMODIUM  Take 2 mg by mouth as needed for diarrhea or loose stools.     ranitidine 75 MG tablet  Commonly known as:  ZANTAC  Take 75 mg by mouth 2 (two) times daily as needed (stomach ache). For indigestion     thiamine 100 MG tablet  Take 1 tablet (100 mg total) by mouth daily.     UNABLE TO FIND  Med Name: Med pass 120 mL by mouth 4 times daily as supplement with meals and bedtime        Immunizations: Immunization History  Administered Date(s)  Administered  . PPD Test 01/28/2016     Physical Exam: Filed Vitals:   01/30/16 1257  BP: 116/74  Pulse: 90  Temp: 97.8 F (36.6 C)  TempSrc: Oral  Resp: 20  Height: 5\' 8"  (1.727 m)  Weight: 110 lb (49.896 kg)  SpO2: 95%   Body mass index is 16.73 kg/(m^2).   General- elderly female, thin built, frail, in no acute distress Head- normocephalic, atraumatic Nose- no maxillary or frontal sinus tenderness, no nasal discharge Throat- moist mucus membrane, mild erythema of oropharynx. Has upper dentures and poor lower dentition Eyes- PERRLA, EOMI, no pallor, no icterus Neck- no cervical lymphadenopathy Cardiovascular- normal s1,s2, no murmur, trace leg edema Respiratory- bilateral clear to auscultation, no wheeze, no rhonchi, no crackles, no use of accessory muscles Abdomen- bowel sounds present, soft, non tender Musculoskeletal- able to move all 4 extremities, generalized weakness, limited ROM with right knee Neurological- alert and oriented to person, place and time Skin- warm and dry Psychiatry- normal mood and affect    Labs reviewed: Basic Metabolic Panel:  Recent Labs  16/08/9602/26/17 1957 01/27/16 0419 01/28/16 01/28/16 0407  NA 133* 134* 135* 135  K 3.8 4.0  --  3.9  CL 95* 97*  --  97*  CO2 28 28  --  29  GLUCOSE 102* 111*  --  97  BUN 5* 7 9 9   CREATININE 0.35* 0.34* 0.4* 0.41*  CALCIUM 8.8* 8.8*  --  8.6*   Liver Function Tests:  Recent Labs  01/26/16 1957  AST 68*  ALT 27  ALKPHOS 144*  BILITOT 1.1  PROT 7.6  ALBUMIN 3.3*   No results for input(s): LIPASE, AMYLASE in the last 8760 hours. No results for input(s): AMMONIA in the last 8760 hours. CBC:  Recent Labs  01/26/16 1957 01/27/16 0419 01/28/16 01/28/16 0407  WBC 8.2 6.8 7.7 7.7  NEUTROABS 5.9  --   --   --   HGB 12.8 12.3  --  12.3  HCT 36.0 34.2*  --  34.4*  MCV 112.5* 113.6*  --  116.2*  PLT 206 203  --  215   Cardiac Enzymes: No results for input(s): CKTOTAL, CKMB, CKMBINDEX,  TROPONINI in the last 8760 hours. BNP: Invalid input(s): POCBNP CBG: No results for input(s): GLUCAP in the last 8760 hours.  Radiological Exams: Dg Tibia/fibula Right  01/26/2016  CLINICAL DATA:  Pain and swelling EXAM: RIGHT TIBIA AND FIBULA - 2 VIEW COMPARISON:  Right knee same day FINDINGS: Three views of the right tibia-fibula submitted. No acute fracture or subluxation. There is diffuse osteopenia. IMPRESSION: No acute fracture or subluxation.  Diffuse osteopenia. Electronically Signed   By: Natasha MeadLiviu  Pop M.D.   On: 01/26/2016 17:50   Mr Knee Right Wo Contrast  01/28/2016  CLINICAL DATA:  Increasing knee pain, swelling and limited weight-bearing following minor injury 3 weeks ago. EXAM: MRI OF  THE RIGHT KNEE WITHOUT CONTRAST TECHNIQUE: Multiplanar, multisequence MR imaging of the knee was performed. No intravenous contrast was administered. COMPARISON:  Radiographs 01/26/2016. FINDINGS: MENISCI Medial meniscus:  Intact with normal morphology. Lateral meniscus:  Intact with normal morphology. LIGAMENTS Cruciates:  Intact. Collaterals:  Intact. CARTILAGE Patellofemoral:  Preserved. Medial:  Mild chondral thinning without focal defect. Lateral: Mild chondral thinning without focal defect. There is a mildly depressed fracture of the lateral tibial plateau, further described below. OTHER Joint:  Small joint effusion. Popliteal Fossa: There is posterior soft tissue edema with mildly prominent fluid in the popliteus hiatus. No significant Baker's cyst. Extensor Mechanism:  Intact. Bones: In correlation with the prior radiographs, the bones are diffusely demineralized. There is edema throughout the lateral tibial plateau. There is a mildly depressed fracture of the lateral tibial plateau with a cortical component, best seen on the coronal images. There is up to 2 mm of depression of the articular surface. This is likely an insufficiency fracture. Although the underlying marrow is diffusely heterogeneous, a  pathologic fracture is felt to be unlikely. There is a probable incidental lipoma within the distal femoral metaphysis. IMPRESSION: 1. Probable insufficiency fracture of the lateral tibial plateau with mild depression of the articular surface. Radiographic follow up after mobilization recommended to document healing and exclude a pathologic component (not strongly suspected). 2. Small joint effusion with edema in the popliteal fossa. 3. Intact menisci, cruciate and collateral ligaments. Electronically Signed   By: Carey Bullocks M.D.   On: 01/28/2016 07:45   Dg Knee Complete 4 Views Right  01/26/2016  CLINICAL DATA:  Right knee pain for 2 weeks. No known injury. Initial encounter. EXAM: RIGHT KNEE - COMPLETE 4+ VIEW COMPARISON:  None. FINDINGS: No acute bony abnormality is seen. Bones are markedly osteopenic. Joint spaces are preserved. Small joint effusion is noted. IMPRESSION: Negative for fracture. Small joint effusion. Osteopenia. Electronically Signed   By: Drusilla Kanner M.D.   On: 01/26/2016 17:49    Assessment/Plan  Unsteady gait With right tibial plateau fracture. Will have patient work with PT/OT as tolerated to regain strength and restore function.  Fall precautions are in place.  Right tibial plateau fracture No surgery performed. Conservative management for now. NWB to RLE x 6 week.s to f/u with orthopedics. Discontinue norco. Start percocet 5-325 mg 1-2 tab q4h prn pain and monitor. Will have her work with physical therapy and occupational therapy team to help with gait training and muscle strengthening exercises.fall precautions. Skin care. Encourage to be out of bed.   Protein calorie malnutrition Continue medpass supplement. Get dietary consult  Alcohol use Continue folic acid and thiamine supplement  Loose stool Continue imdoium prn. Hydration encouraged. Check cmp  gerd Continue ranitidine 75 mg bid prn and monitor. Check cbc  Anxiety state Her current home situation  and recent fracture with NWB status is making her anxious. Have asked her Social worker to reach out to the patient. Get psychotherapy consult for situational anxiety. Avoid benzodiazepine to prevent sedation and fall.   Goals of care: short term rehabilitation   Labs/tests ordered: cbc, cmp 02/03/16  Family/ staff Communication: reviewed care plan with patient and nursing supervisor    Oneal Grout, MD Internal Medicine [facility] [address] [place], [place] [address] Cell Phone (Monday-Friday 8 am - 5 pm): [phone] On Call: [phone] and follow prompts after 5 pm and on weekends Office Phone: [phone] Office Fax: [phone]

## 2016-01-31 ENCOUNTER — Non-Acute Institutional Stay (SKILLED_NURSING_FACILITY): Payer: Medicare Other | Admitting: *Deleted

## 2016-01-31 ENCOUNTER — Encounter: Payer: Self-pay | Admitting: *Deleted

## 2016-01-31 DIAGNOSIS — G4700 Insomnia, unspecified: Secondary | ICD-10-CM

## 2016-01-31 DIAGNOSIS — F418 Other specified anxiety disorders: Secondary | ICD-10-CM | POA: Diagnosis not present

## 2016-01-31 MED ORDER — MELATONIN 3 MG PO TABS: 3.0000 mg | ORAL_TABLET | Freq: Every day | ORAL | Status: DC

## 2016-01-31 MED ORDER — SERTRALINE HCL 50 MG PO TABS: 25.0000 mg | ORAL_TABLET | Freq: Every day | ORAL | Status: DC

## 2016-01-31 NOTE — Progress Notes (Signed)
Location:  [facility] Room Number: 703 P Place of Service:  SNF [phone] Provider: Timmie FoersterMahiama Pandey, MD   Leanor RubensteinSUN,[name], MD  Patient Care Team: Deatra JamesVyvyan Sun, MD as PCP - General (Family Medicine)  Extended Emergency Contact Information Primary Emergency Contact: Marcial PacasHart,Joseph  [place] Home Phone: [phone] Relation: Other Secondary Emergency Contact: Johnson,Yvonne Address: [address]          [place], [place] [address] [place] Mobile Phone: [phone] Relation: Friend  Code Status: Full Code  Goals of care: Advanced Directive information Advanced Directives 01/31/2016  Does patient have an advance directive? No  Would patient like information on creating an advanced directive? No - patient declined information     Chief Complaint  Patient presents with  . Acute Visit    HPI:  Pt is a 70 y.o. female seen today at [facility]  for an acute visit for evaluation of depression. She has a medical history of Arthritis, Knee pain, Vit D deficiency among others. She is seen in her room today per facility nurse request to evaluate for depression. Facility Nurse states patient has been tearful at times and reports depressed. She denies any suicidal ideation or harming self. She complains of right knee pain current pain medication dose and frequency adjusted 01/30/2016 awaiting delivery from pharmacy. Hard script written for back up pharmacy.    Past Medical History  Diagnosis Date  . Vein, varicose   . Arthritis   . Vitamin D deficiency    Past Surgical History  Procedure Laterality Date  . Hip surgery    . Joint replacement      No Known Allergies    Medication List       This list is accurate as of: 01/31/16 10:21 AM.  Always use your most recent med list.               folic acid 1 MG tablet  Commonly known as:  FOLVITE  Take 1 tablet (1 mg total) by mouth daily.     loperamide 2 MG capsule  Commonly known as:  IMODIUM  Take 2 mg by mouth as needed for diarrhea or loose stools.     PERCOCET 5-325 MG tablet  Generic drug:  oxyCODONE-acetaminophen  Take 1-2 tablets by mouth every 4 (four) hours as needed for moderate pain or severe pain. Take 1 tablet for moderate pain and 2 tablets for severe pain.     ranitidine 75 MG tablet  Commonly known as:  ZANTAC  Take 75 mg by mouth 2 (two) times daily as needed (stomach ache). For indigestion     thiamine 100 MG tablet  Take 1 tablet (100 mg total) by mouth daily.     UNABLE TO FIND  Med Name: Med pass 120 mL by mouth 4 times daily as supplement with meals and bedtime        Review of Systems  Constitutional: Negative for fever, chills, activity change and fatigue.  HENT: Negative.   Eyes: Negative.   Respiratory: Negative.   Cardiovascular: Negative.   Gastrointestinal: Negative.   Musculoskeletal: Positive for arthralgias and gait problem.  Skin: Negative.   Psychiatric/Behavioral: Positive for sleep disturbance. Negative for suicidal ideas. The patient is nervous/anxious.        Depression.     Immunization History  Administered Date(s) Administered  . PPD Test 01/28/2016   Pertinent  Health Maintenance Due  Topic  Date Due  . MAMMOGRAM  08/26/1998  . COLONOSCOPY  08/26/1998  . DEXA SCAN  08/26/2013  . PNA vac Low Risk Adult (1 of 2 - PCV13) 08/26/2013  . INFLUENZA VACCINE  06/03/2015   No flowsheet data found. Functional Status Survey:    Filed Vitals:   01/31/16 1006  BP: 118/74  Pulse: 66  Temp: 97.9 F (36.6 C)  TempSrc: Oral  Resp: 18  Height:  (1.676 m)  Weight: 110 lb (49.896 kg)  SpO2: 95%   Body mass index is 17.76 kg/(m^2). Physical Exam  Constitutional: She is oriented to person, place, and time.  Thin frail Elderly   HENT:  Head: Normocephalic.  Mouth/Throat: Oropharynx is clear and moist.  Eyes: Conjunctivae and EOM are normal. Pupils are equal,  round, and reactive to light. Right eye exhibits no discharge. Left eye exhibits no discharge. No scleral icterus.  Neck: Normal range of motion.  Cardiovascular: Normal rate, regular rhythm, normal heart sounds and intact distal pulses.   Pulmonary/Chest: Effort normal and breath sounds normal. No respiratory distress. She has no wheezes. She has no rales.  Abdominal: Soft. Bowel sounds are normal. She exhibits no distension and no mass. There is no tenderness. There is no rebound and no guarding.  Musculoskeletal: She exhibits no edema or tenderness.  Neurological: She is oriented to person, place, and time.  Skin: Skin is warm and dry. No rash noted. No erythema. No pallor.  Psychiatric:  Emotional and depressed during visit.     Labs reviewed:  Recent Labs  01/26/16 1957 01/27/16 0419 01/28/16 01/28/16 0407  NA 133* 134* 135* 135  K 3.8 4.0  --  3.9  CL 95* 97*  --  97*  CO2 28 28  --  29  GLUCOSE 102* 111*  --  97  BUN 5* CREATININE 0.35* 0.34* 0.4* 0.41*  CALCIUM 8.8* 8.8*  --  8.6*    Recent Labs  01/26/16 1957  AST 68*  ALT 27  ALKPHOS 144*  BILITOT 1.1  PROT 7.6  ALBUMIN 3.3*    Recent Labs  01/26/16 1957 01/27/16 0419 01/28/16 01/28/16 0407  WBC 8.2 6.8 7.7 7.7  NEUTROABS 5.9  --   --   --   HGB 12.8 12.3  --  12.3  HCT 36.0 34.2*  --  34.4*  MCV 112.5* 113.6*  --  116.2*  PLT 206 203  --  215   Lab Results  Component Value Date   TSH 2.422 10/18/2009   No results found for: HGBA1C Lab Results  Component Value Date   CHOL 194 11/29/2009   HDL 52 11/29/2009   LDLCALC 125* 11/29/2009   TRIG 86 11/29/2009   CHOLHDL 3.7 Ratio 11/29/2009    Significant Diagnostic Results in last 30 days:  Dg Tibia/fibula Right  01/26/2016  CLINICAL DATA:  Pain and swelling EXAM: RIGHT TIBIA AND FIBULA - 2 VIEW COMPARISON:  Right knee same day FINDINGS: Three views of the right tibia-fibula submitted. No acute fracture or subluxation. There is diffuse  osteopenia. IMPRESSION: No acute fracture or subluxation.  Diffuse osteopenia. Electronically Signed   By: Natasha Mead M.D.   On: 01/26/2016 17:50   Mr Knee Right Wo Contrast  01/28/2016  CLINICAL DATA:  Increasing knee pain, swelling and limited weight-bearing following minor injury 3 weeks ago. EXAM: MRI OF THE RIGHT KNEE WITHOUT CONTRAST TECHNIQUE: Multiplanar, multisequence MR imaging of the knee was performed. No intravenous contrast was  administered. COMPARISON:  Radiographs 01/26/2016. FINDINGS: MENISCI Medial meniscus:  Intact with normal morphology. Lateral meniscus:  Intact with normal morphology. LIGAMENTS Cruciates:  Intact. Collaterals:  Intact. CARTILAGE Patellofemoral:  Preserved. Medial:  Mild chondral thinning without focal defect. Lateral: Mild chondral thinning without focal defect. There is a mildly depressed fracture of the lateral tibial plateau, further described below. OTHER Joint:  Small joint effusion. Popliteal Fossa: There is posterior soft tissue edema with mildly prominent fluid in the popliteus hiatus. No significant Baker's cyst. Extensor Mechanism:  Intact. Bones: In correlation with the prior radiographs, the bones are diffusely demineralized. There is edema throughout the lateral tibial plateau. There is a mildly depressed fracture of the lateral tibial plateau with a cortical component, best seen on the coronal images. There is up to 2 mm of depression of the articular surface. This is likely an insufficiency fracture. Although the underlying marrow is diffusely heterogeneous, a pathologic fracture is felt to be unlikely. There is a probable incidental lipoma within the distal femoral metaphysis. IMPRESSION: 1. Probable insufficiency fracture of the lateral tibial plateau with mild depression of the articular surface. Radiographic follow up after mobilization recommended to document healing and exclude a pathologic component (not strongly suspected). 2. Small joint effusion with  edema in the popliteal fossa. 3. Intact menisci, cruciate and collateral ligaments. Electronically Signed   By: Carey Bullocks M.D.   On: 01/28/2016 07:45   Dg Knee Complete 4 Views Right  01/26/2016  CLINICAL DATA:  Right knee pain for 2 weeks. No known injury. Initial encounter. EXAM: RIGHT KNEE - COMPLETE 4+ VIEW COMPARISON:  None. FINDINGS: No acute bony abnormality is seen. Bones are markedly osteopenic. Joint spaces are preserved. Small joint effusion is noted. IMPRESSION: Negative for fracture. Small joint effusion. Osteopenia. Electronically Signed   By: Drusilla Kanner M.D.   On: 01/26/2016 17:49    Assessment/Plan Depression/Anxiety : Emotional and sad during visit. Has Psychotherapy consult written 01/30/2016. Will initiate sertraline 25 mg Tablet at daily. Monitor for mood changes.  Insomnia: Ordering Melatonin 3 mg Tablet at bedtime.     Family/ staff Communication: Reviewed plan of care with patient and Ecologist.   Labs/tests ordered: None

## 2016-02-06 ENCOUNTER — Other Ambulatory Visit: Payer: Self-pay | Admitting: *Deleted

## 2016-02-06 MED ORDER — OXYCODONE-ACETAMINOPHEN 5-325 MG PO TABS: ORAL_TABLET | ORAL | Status: DC

## 2016-02-06 MED ORDER — OXYCODONE-ACETAMINOPHEN 5-325 MG PO TABS
ORAL_TABLET | ORAL | Status: DC
Start: 2016-02-06 — End: 2016-02-17

## 2016-02-06 NOTE — Telephone Encounter (Signed)
[facility]

## 2016-02-06 NOTE — Addendum Note (Signed)
Addended by: Sueanne MargaritaSMITH, DESHANNON L on: 02/06/2016 10:36 AM   Modules accepted: Orders

## 2016-02-06 NOTE — Telephone Encounter (Signed)
[facility] 

## 2016-02-11 DIAGNOSIS — F330 Major depressive disorder, recurrent, mild: Secondary | ICD-10-CM | POA: Diagnosis not present

## 2016-02-11 DIAGNOSIS — F419 Anxiety disorder, unspecified: Secondary | ICD-10-CM | POA: Diagnosis not present

## 2016-02-17 ENCOUNTER — Other Ambulatory Visit: Payer: Self-pay

## 2016-02-17 MED ORDER — OXYCODONE-ACETAMINOPHEN 5-325 MG PO TABS: ORAL_TABLET | ORAL | Status: DC

## 2016-02-18 DIAGNOSIS — F330 Major depressive disorder, recurrent, mild: Secondary | ICD-10-CM | POA: Diagnosis not present

## 2016-02-18 DIAGNOSIS — F419 Anxiety disorder, unspecified: Secondary | ICD-10-CM | POA: Diagnosis not present

## 2016-02-24 ENCOUNTER — Encounter: Payer: Self-pay | Admitting: Family

## 2016-02-24 ENCOUNTER — Non-Acute Institutional Stay (SKILLED_NURSING_FACILITY): Payer: Medicare Other | Admitting: Family

## 2016-02-24 DIAGNOSIS — F32A Depression, unspecified: Secondary | ICD-10-CM

## 2016-02-24 DIAGNOSIS — G4700 Insomnia, unspecified: Secondary | ICD-10-CM

## 2016-02-24 DIAGNOSIS — F329 Major depressive disorder, single episode, unspecified: Secondary | ICD-10-CM | POA: Diagnosis not present

## 2016-02-24 DIAGNOSIS — E440 Moderate protein-calorie malnutrition: Secondary | ICD-10-CM

## 2016-02-24 DIAGNOSIS — R600 Localized edema: Secondary | ICD-10-CM | POA: Diagnosis not present

## 2016-02-24 DIAGNOSIS — F411 Generalized anxiety disorder: Secondary | ICD-10-CM | POA: Diagnosis not present

## 2016-02-24 DIAGNOSIS — R609 Edema, unspecified: Secondary | ICD-10-CM | POA: Insufficient documentation

## 2016-02-24 MED ORDER — TRAZODONE HCL 50 MG PO TABS: 25.0000 mg | ORAL_TABLET | Freq: Every evening | ORAL | Status: DC | PRN

## 2016-02-24 NOTE — Progress Notes (Signed)
Location:  [facility] Room Number: [phone] Place of Service:  SNF (31) Provider:  Richarda Blade, FNP-C Oneal Grout, MD   [name], MD  Patient Care Team: Deatra James, MD as PCP - General (Family Medicine)  Extended Emergency Contact Information Primary Emergency Contact: Marcial Pacas [place] Home Phone: [phone] Relation: Other Secondary Emergency Contact: Johnson,Yvonne Address: [address]          [place], [place] [address] [place] Mobile Phone: [phone] Relation: Friend  Code Status: Full Code  Goals of care: Advanced Directive information Advanced Directives 01/31/2016  Does patient have an advance directive? No  Would patient like information on creating an advanced directive? No - patient declined information     Chief Complaint  Patient presents with  . Medical Management of Chronic Issues    Routine Visit    HPI:  Pt is a 70 y.o. female seen today at  [facility] for medical management of chronic diseases. She has a medical history of Generalized anxiety, Alcohol abuse, Protein Malnutrition, Arthritis, Depression among others. She is seen in her room today watching TV in bed. She states recent bilateral Leg edema has improved. She request stronger sleeping Aid. Facility staff report no new concerns.    Past Medical History  Diagnosis Date  . Vein, varicose   . Arthritis   . Vitamin D deficiency    Past Surgical History  Procedure Laterality Date  . Hip surgery    . Joint replacement      No Known Allergies    Medication List       This list is accurate as of: 02/24/16 10:23 AM.  Always use your most recent med list.               folic acid 1 MG tablet  Commonly known as:  FOLVITE  Take 1 tablet (1 mg total) by mouth daily.     loperamide 2 MG capsule  Commonly known as:  IMODIUM  Take 2 mg by mouth as needed for diarrhea or loose  stools.     oxyCODONE-acetaminophen 5-325 MG tablet  Commonly known as:  PERCOCET/ROXICET  Take 1-2 tablets by mouth every 4 hours as needed for moderate to severe pain.     ranitidine 75 MG tablet  Commonly known as:  ZANTAC  Take 75 mg by mouth 2 (two) times daily as needed (stomach ache). For indigestion     thiamine 100 MG tablet  Take 1 tablet (100 mg total) by mouth daily.     UNABLE TO FIND  Med Name: Med pass 120 mL by mouth 4 times daily as supplement with meals and bedtime        Review of Systems  Constitutional: Negative for fever, chills, activity change, appetite change and fatigue.  HENT: Negative.   Eyes: Negative.   Respiratory: Negative.   Cardiovascular: Negative.   Gastrointestinal: Negative.   Genitourinary: Negative for dysuria, urgency, frequency and flank pain.  Musculoskeletal: Positive for gait problem.  Skin: Negative.   Neurological: Negative for dizziness, seizures, syncope, light-headedness and headaches.  Psychiatric/Behavioral: Positive for sleep disturbance. Negative for suicidal ideas, hallucinations, confusion and agitation. The patient is not nervous/anxious.     Immunization History  Administered Date(s) Administered  . PPD Test 01/28/2016, 02/12/2016   Pertinent  Health Maintenance Due  Topic Date Due  . MAMMOGRAM  11/02/2016 (Originally 08/26/1998)  . DEXA SCAN  11/02/2016 (Originally 08/26/2013)  . COLONOSCOPY  11/02/2016 (Originally 08/26/1998)  . PNA vac Low Risk Adult (1 of 2 - PCV13) 11/02/2016 (Originally 08/26/2013)  . INFLUENZA VACCINE  06/02/2016   No flowsheet data found. Functional Status Survey:    Filed Vitals:   02/24/16 1005  BP: 126/72  Pulse: 65  Temp: 98.2 F (36.8 C)  Resp: 18  Height:  (1.676 m)  Weight: 106 lb 1.6 oz (48.127 kg)  SpO2: 99%   Body mass index is 17.13 kg/(m^2). Physical Exam  Labs reviewed:  Recent Labs  01/26/16 1957 01/27/16 0419 01/28/16 01/28/16 0407  NA 133* 134*  135* 135  K 3.8 4.0  --  3.9  CL 95* 97*  --  97*  CO2 28 28  --  29  GLUCOSE 102* 111*  --  97  BUN 5* CREATININE 0.35* 0.34* 0.4* 0.41*  CALCIUM 8.8* 8.8*  --  8.6*    Recent Labs  01/26/16 1957  AST 68*  ALT 27  ALKPHOS 144*  BILITOT 1.1  PROT 7.6  ALBUMIN 3.3*    Recent Labs  01/26/16 1957 01/27/16 0419 01/28/16 01/28/16 0407  WBC 8.2 6.8 7.7 7.7  NEUTROABS 5.9  --   --   --   HGB 12.8 12.3  --  12.3  HCT 36.0 34.2*  --  34.4*  MCV 112.5* 113.6*  --  116.2*  PLT 206 203  --  215   Lab Results  Component Value Date   TSH 2.422 10/18/2009   No results found for: HGBA1C Lab Results  Component Value Date   CHOL 194 11/29/2009   HDL 52 11/29/2009   LDLCALC 125* 11/29/2009   TRIG 86 11/29/2009   CHOLHDL 3.7 Ratio 11/29/2009    Significant Diagnostic Results in last 30 days:  Dg Tibia/fibula Right  01/26/2016  CLINICAL DATA:  Pain and swelling EXAM: RIGHT TIBIA AND FIBULA - 2 VIEW COMPARISON:  Right knee same day FINDINGS: Three views of the right tibia-fibula submitted. No acute fracture or subluxation. There is diffuse osteopenia. IMPRESSION: No acute fracture or subluxation.  Diffuse osteopenia. Electronically Signed   By: Natasha Mead M.D.   On: 01/26/2016 17:50   Mr Knee Right Wo Contrast  01/28/2016  CLINICAL DATA:  Increasing knee pain, swelling and limited weight-bearing following minor injury 3 weeks ago. EXAM: MRI OF THE RIGHT KNEE WITHOUT CONTRAST TECHNIQUE: Multiplanar, multisequence MR imaging of the knee was performed. No intravenous contrast was administered. COMPARISON:  Radiographs 01/26/2016. FINDINGS: MENISCI Medial meniscus:  Intact with normal morphology. Lateral meniscus:  Intact with normal morphology. LIGAMENTS Cruciates:  Intact. Collaterals:  Intact. CARTILAGE Patellofemoral:  Preserved. Medial:  Mild chondral thinning without focal defect. Lateral: Mild chondral thinning without focal defect. There is a mildly depressed fracture of the  lateral tibial plateau, further described below. OTHER Joint:  Small joint effusion. Popliteal Fossa: There is posterior soft tissue edema with mildly prominent fluid in the popliteus hiatus. No significant Baker's cyst. Extensor Mechanism:  Intact. Bones: In correlation with the prior radiographs, the bones are diffusely demineralized. There is edema throughout the lateral tibial plateau. There is a mildly depressed fracture of the lateral tibial plateau with a cortical component, best seen on the coronal images. There is up to 2 mm of depression of the articular surface. This is likely an insufficiency fracture. Although the underlying marrow is diffusely heterogeneous, a pathologic fracture is felt to be unlikely. There is a probable  incidental lipoma within the distal femoral metaphysis. IMPRESSION: 1. Probable insufficiency fracture of the lateral tibial plateau with mild depression of the articular surface. Radiographic follow up after mobilization recommended to document healing and exclude a pathologic component (not strongly suspected). 2. Small joint effusion with edema in the popliteal fossa. 3. Intact menisci, cruciate and collateral ligaments. Electronically Signed   By: Carey BullocksWilliam  Veazey M.D.   On: 01/28/2016 07:45   Dg Knee Complete 4 Views Right  01/26/2016  CLINICAL DATA:  Right knee pain for 2 weeks. No known injury. Initial encounter. EXAM: RIGHT KNEE - COMPLETE 4+ VIEW COMPARISON:  None. FINDINGS: No acute bony abnormality is seen. Bones are markedly osteopenic. Joint spaces are preserved. Small joint effusion is noted. IMPRESSION: Negative for fracture. Small joint effusion. Osteopenia. Electronically Signed   By: Drusilla Kannerhomas  Dalessio M.D.   On: 01/26/2016 17:49    Assessment/Plan Protein Malnutrition Appetite has improved. Continue on protein supplements. BMP, Hgb A1C and Lipid panel 02/25/2016  Localized edema  Has improved. Continue Bilat. Knee high ted hose on in the morning and off at  bedtime. Elevate legs when in bed.   Depression Has improved on sertraline. Continue to monitor for mood changes. TSH level 02/25/2016  Generalized anxiety disorder Continue on sertraline.   Insomnia   requests for stronger sleep Aid. Able to fall asleep with current melatonin but wears off early. Add Trazodone 50 mg Tablet take 1/2 Tablet at bedtime for sleep.     Family/ staff Communication: Reviewed plan with patient and facility Nurse Supervisor  Labs/tests ordered:  CBC, BMP, TSH, Hgb A1C and Lipid panel 02/25/2016

## 2016-02-25 DIAGNOSIS — E08311 Diabetes mellitus due to underlying condition with unspecified diabetic retinopathy with macular edema: Secondary | ICD-10-CM | POA: Diagnosis not present

## 2016-02-25 DIAGNOSIS — D508 Other iron deficiency anemias: Secondary | ICD-10-CM | POA: Diagnosis not present

## 2016-02-25 LAB — BASIC METABOLIC PANEL
BUN: 10 mg/dL (ref 4–21)
Creatinine: 0.5 mg/dL (ref 0.5–1.1)
Glucose: 78 mg/dL
Potassium: 3.9 mmol/L (ref 3.4–5.3)
SODIUM: 139 mmol/L (ref 137–147)

## 2016-02-25 LAB — LIPID PANEL
CHOLESTEROL: 93 mg/dL (ref 0–200)
HDL: 20 mg/dL — AB (ref 35–70)
LDL Cholesterol: 47 mg/dL
TRIGLYCERIDES: 130 mg/dL (ref 40–160)

## 2016-02-25 LAB — CBC AND DIFFERENTIAL
HEMATOCRIT: 32 % — AB (ref 36–46)
HEMOGLOBIN: 11 g/dL — AB (ref 12.0–16.0)
PLATELETS: 248 10*3/uL (ref 150–399)
WBC: 4.7 10^3/mL

## 2016-02-25 LAB — TSH: TSH: 2.73 u[IU]/mL (ref 0.41–5.90)

## 2016-02-25 LAB — HEMOGLOBIN A1C: HEMOGLOBIN A1C: 4.8

## 2016-02-26 ENCOUNTER — Non-Acute Institutional Stay (SKILLED_NURSING_FACILITY): Payer: Medicare Other | Admitting: Family

## 2016-02-26 ENCOUNTER — Encounter: Payer: Self-pay | Admitting: Family

## 2016-02-26 DIAGNOSIS — F330 Major depressive disorder, recurrent, mild: Secondary | ICD-10-CM | POA: Diagnosis not present

## 2016-02-26 DIAGNOSIS — B9789 Other viral agents as the cause of diseases classified elsewhere: Secondary | ICD-10-CM

## 2016-02-26 DIAGNOSIS — J029 Acute pharyngitis, unspecified: Secondary | ICD-10-CM | POA: Diagnosis not present

## 2016-02-26 DIAGNOSIS — F419 Anxiety disorder, unspecified: Secondary | ICD-10-CM | POA: Diagnosis not present

## 2016-02-26 DIAGNOSIS — J028 Acute pharyngitis due to other specified organisms: Principal | ICD-10-CM

## 2016-02-26 NOTE — Progress Notes (Signed)
Location:  [facility] Room Number: [phone] Place of Service:  SNF (31) Provider:  Richarda Blade, FNP-C Oneal Grout, MD   [name], MD  Patient Care Team: Deatra James, MD as PCP - General (Family Medicine)  Extended Emergency Contact Information Primary Emergency Contact: Marcial Pacas [place] Home Phone: [phone] Relation: Other Secondary Emergency Contact: Johnson,Yvonne Address: [address]          [place], [place] [address] [place] Mobile Phone: [phone] Relation: Friend  Code Status: Full Code  Goals of care: Advanced Directive information Advanced Directives 01/31/2016  Does patient have an advance directive? No  Would patient like information on creating an advanced directive? No - patient declined information     Chief Complaint  Patient presents with  . Acute Visit    Follow up on Sore Throat    HPI:  Pt is a 70 y.o. female seen today at  for an acute visit for complains of sore throat. She is seen in her room today. She complains of a recurrent sore throat for the past three days. She had sore throat few weeks ago and used chloraseptic spray with pain relieved.  She feels like she is loosing her voice. She denies any difficulty swallowing, fever, chills, cough or sneezing. Facility Nurse reports patient's throat was red two ago but resolved.    Past Medical History  Diagnosis Date  . Vein, varicose   . Arthritis   . Vitamin D deficiency    Past Surgical History  Procedure Laterality Date  . Hip surgery    . Joint replacement      No Known Allergies    Medication List       This list is accurate as of: 02/26/16 12:47 PM.  Always use your most recent med list.               folic acid 1 MG tablet  Commonly known as:  FOLVITE  Take 1 tablet (1 mg total) by mouth daily.     loperamide 2 MG capsule  Commonly known as:  IMODIUM  Take 2 mg by mouth as needed for  diarrhea or loose stools.     Melatonin 3 MG Tabs  Take 3 mg by mouth.     oxyCODONE-acetaminophen 5-325 MG tablet  Commonly known as:  PERCOCET/ROXICET  Take 1-2 tablets by mouth every 4 hours as needed for moderate to severe pain.     ranitidine 75 MG tablet  Commonly known as:  ZANTAC  Take 75 mg by mouth 2 (two) times daily as needed (stomach ache). For indigestion     sertraline 25 MG tablet  Commonly known as:  ZOLOFT  Take 25 mg by mouth daily.     thiamine 100 MG tablet  Take 1 tablet (100 mg total) by mouth daily.     traZODone 50 MG tablet  Commonly known as:  DESYREL  Take 0.5 tablets (25 mg total) by mouth at bedtime as needed for sleep.     UNABLE TO FIND  Med Name: Med pass 120 mL by mouth 4 times daily as supplement with meals and bedtime        Review of Systems  Constitutional: Negative for fever, chills, activity change, appetite change and fatigue.  HENT: Negative for congestion, ear pain, hearing loss, rhinorrhea, sinus pressure and sneezing.   Eyes: Negative.   Respiratory: Negative for cough, shortness of breath and  wheezing.   Gastrointestinal: Negative for nausea, vomiting and abdominal pain.  Skin: Negative for color change, pallor and rash.  Neurological: Negative for dizziness, light-headedness and headaches.  Psychiatric/Behavioral: Negative.     Immunization History  Administered Date(s) Administered  . PPD Test 01/28/2016, 02/12/2016   Pertinent  Health Maintenance Due  Topic Date Due  . MAMMOGRAM  11/02/2016 (Originally 08/26/1998)  . DEXA SCAN  11/02/2016 (Originally 08/26/2013)  . COLONOSCOPY  11/02/2016 (Originally 08/26/1998)  . PNA vac Low Risk Adult (1 of 2 - PCV13) 11/02/2016 (Originally 08/26/2013)  . INFLUENZA VACCINE  06/02/2016   No flowsheet data found. Functional Status Survey:    Filed Vitals:   02/26/16 1240  BP: 126/72  Pulse: 65  Temp: 98.2 F (36.8 C)  Resp: 18  Height: 5\' 6"  (1.676 m)  Weight: 106 lb  1.6 oz (48.127 kg)  SpO2: 99%   Body mass index is 17.13 kg/(m^2). Physical Exam  Constitutional: She is oriented to person, place, and time.  Thin frail Elder in no acute distress  HENT:  Head: Normocephalic.  Mouth/Throat: Oropharynx is clear and moist. No oropharyngeal exudate.  Eyes: Conjunctivae and EOM are normal. Pupils are equal, round, and reactive to light. Right eye exhibits no discharge. Left eye exhibits no discharge. No scleral icterus.  Neck: Normal range of motion. No thyromegaly present.  Cardiovascular: Normal rate, regular rhythm, normal heart sounds and intact distal pulses.  Exam reveals no gallop and no friction rub.   No murmur heard. Pulmonary/Chest: Effort normal and breath sounds normal. No respiratory distress. She has no wheezes. She has no rales.  Abdominal: Soft. Bowel sounds are normal. She exhibits no distension. There is no tenderness. There is no rebound and no guarding.  Lymphadenopathy:    She has no cervical adenopathy.  Neurological: She is oriented to person, place, and time.  Skin: Skin is warm and dry. No rash noted. No erythema. No pallor.  Psychiatric: She has a normal mood and affect.    Labs reviewed:  Recent Labs  01/26/16 1957 01/27/16 0419 01/28/16 01/28/16 0407  NA 133* 134* 135* 135  K 3.8 4.0  --  3.9  CL 95* 97*  --  97*  CO2 28 28  --  29  GLUCOSE 102* 111*  --  97  BUN 5* 7 9 9   CREATININE 0.35* 0.34* 0.4* 0.41*  CALCIUM 8.8* 8.8*  --  8.6*    Recent Labs  01/26/16 1957  AST 68*  ALT 27  ALKPHOS 144*  BILITOT 1.1  PROT 7.6  ALBUMIN 3.3*    Recent Labs  01/26/16 1957 01/27/16 0419 01/28/16 01/28/16 0407  WBC 8.2 6.8 7.7 7.7  NEUTROABS 5.9  --   --   --   HGB 12.8 12.3  --  12.3  HCT 36.0 34.2*  --  34.4*  MCV 112.5* 113.6*  --  116.2*  PLT 206 203  --  215   Lab Results  Component Value Date   TSH 2.422 10/18/2009   No results found for: HGBA1C Lab Results  Component Value Date   CHOL 194  11/29/2009   HDL 52 11/29/2009   LDLCALC 125* 11/29/2009   TRIG 86 11/29/2009   CHOLHDL 3.7 Ratio 11/29/2009    Significant Diagnostic Results in last 30 days:  Mr Knee Right Wo Contrast  01/28/2016  CLINICAL DATA:  Increasing knee pain, swelling and limited weight-bearing following minor injury 3 weeks ago. EXAM: MRI OF THE RIGHT KNEE WITHOUT  CONTRAST TECHNIQUE: Multiplanar, multisequence MR imaging of the knee was performed. No intravenous contrast was administered. COMPARISON:  Radiographs 01/26/2016. FINDINGS: MENISCI Medial meniscus:  Intact with normal morphology. Lateral meniscus:  Intact with normal morphology. LIGAMENTS Cruciates:  Intact. Collaterals:  Intact. CARTILAGE Patellofemoral:  Preserved. Medial:  Mild chondral thinning without focal defect. Lateral: Mild chondral thinning without focal defect. There is a mildly depressed fracture of the lateral tibial plateau, further described below. OTHER Joint:  Small joint effusion. Popliteal Fossa: There is posterior soft tissue edema with mildly prominent fluid in the popliteus hiatus. No significant Baker's cyst. Extensor Mechanism:  Intact. Bones: In correlation with the prior radiographs, the bones are diffusely demineralized. There is edema throughout the lateral tibial plateau. There is a mildly depressed fracture of the lateral tibial plateau with a cortical component, best seen on the coronal images. There is up to 2 mm of depression of the articular surface. This is likely an insufficiency fracture. Although the underlying marrow is diffusely heterogeneous, a pathologic fracture is felt to be unlikely. There is a probable incidental lipoma within the distal femoral metaphysis. IMPRESSION: 1. Probable insufficiency fracture of the lateral tibial plateau with mild depression of the articular surface. Radiographic follow up after mobilization recommended to document healing and exclude a pathologic component (not strongly suspected). 2. Small  joint effusion with edema in the popliteal fossa. 3. Intact menisci, cruciate and collateral ligaments. Electronically Signed   By: Carey Bullocks M.D.   On: 01/28/2016 07:45    Assessment/Plan Sore Throat  Afebrile. Negative exam findings. Encourage Fluid intake. Continue with Chloraseptic spray PRN.continue to monitor.    Family/ staff Communication: Reviewed plan with patient and facility Nurse supervisor.   Labs/tests ordered:  None

## 2016-03-04 DIAGNOSIS — S82141A Displaced bicondylar fracture of right tibia, initial encounter for closed fracture: Secondary | ICD-10-CM | POA: Diagnosis not present

## 2016-03-04 DIAGNOSIS — M25561 Pain in right knee: Secondary | ICD-10-CM | POA: Diagnosis not present

## 2016-03-06 ENCOUNTER — Non-Acute Institutional Stay (SKILLED_NURSING_FACILITY): Payer: Medicare Other | Admitting: Family

## 2016-03-06 ENCOUNTER — Encounter: Payer: Self-pay | Admitting: Family

## 2016-03-06 DIAGNOSIS — F411 Generalized anxiety disorder: Secondary | ICD-10-CM

## 2016-03-06 DIAGNOSIS — G4700 Insomnia, unspecified: Secondary | ICD-10-CM | POA: Diagnosis not present

## 2016-03-06 DIAGNOSIS — E440 Moderate protein-calorie malnutrition: Secondary | ICD-10-CM | POA: Diagnosis not present

## 2016-03-06 DIAGNOSIS — F329 Major depressive disorder, single episode, unspecified: Secondary | ICD-10-CM

## 2016-03-06 DIAGNOSIS — R600 Localized edema: Secondary | ICD-10-CM

## 2016-03-06 DIAGNOSIS — R2681 Unsteadiness on feet: Secondary | ICD-10-CM | POA: Diagnosis not present

## 2016-03-06 DIAGNOSIS — F32A Depression, unspecified: Secondary | ICD-10-CM

## 2016-03-06 NOTE — Progress Notes (Signed)
Location:  [facility] Room Number: [phone] Place of Service:  SNF (31)  Provider: Richarda Blade, FNP-C Oneal Grout, MD   PCP: [name], MD Patient Care Team: Deatra James, MD as PCP - General (Family Medicine)  Extended Emergency Contact Information Primary Emergency Contact: Marcial Pac[place] Home Phone: [phone] Relation: Other Secondary Emergency Contact: Johnson,Yvonne Address: [address]          [place], [place] [address] [place] Mobile Phone: [phone] Relation: Friend  Code Status:Full Code  Goals of care:  Advanced Directive information Advanced Directives 01/31/2016  Does patient have an advance directive? No  Would patient like information on creating an advanced directive? No - patient declined information     No Known Allergies  Chief Complaint  Patient presents with  . Discharge Note    Discharged from SNF    HPI:  70 y.o. female seen for discharge home. She was here for short term rehabilitation post hospital admission from 01/26/16-01/28/16 with right knee pain and swelling of her knee and right leg. DVT was ruled out. X ray revealed right knee effusion and MRI revealed right lateral tibial plateau fracture. Orthopedic was consulted and NWB to RLE for 6 weeks and pain management was recommended.She is seen in her room today.She states right knee/leg pain under control with current regimen. She states no acute issues today. During her stay here at the facility she was started on Sertraline for anxiety and depression with much improvement. She is Right leg NWB has worked well with PT/OT. She will be discharge home with PT/OT to continue with ROM, exercise and muscle strengthening. She will require DME standard wheelchair with elevating leg rests, cushion, anti tippers and extended brakes handles. Home health services will be arranged by facility social worker prior to discharge  home.    Past Medical History  Diagnosis Date  . Vein, varicose   . Arthritis   . Vitamin D deficiency     Past Surgical History  Procedure Laterality Date  . Hip surgery    . Joint replacement        reports that she quit smoking about 3 years ago. She has never used smokeless tobacco. She reports that she drinks alcohol. She reports that she does not use illicit drugs. Social History   Social History  . Marital Status: Divorced    Spouse Name: N/A  . Number of Children: N/A  . Years of Education: N/A   Occupational History  . Not on file.   Social History Main Topics  . Smoking status: Former Smoker    Quit date: 01/12/2013  . Smokeless tobacco: Never Used  . Alcohol Use: Yes     Comment: intermittent, unable to quantify  . Drug Use: No  . Sexual Activity: Not on file   Other Topics Concern  . Not on file   Social History Narrative   Functional Status Survey:    No Known Allergies  Pertinent  Health Maintenance Due  Topic Date Due  . MAMMOGRAM  11/02/2016 (Originally 08/26/1998)  . DEXA SCAN  11/02/2016 (Originally 08/26/2013)  . COLONOSCOPY  11/02/2016 (Originally 08/26/1998)  . PNA vac Low Risk Adult (1 of 2 - PCV13) 11/02/2016 (Originally 08/26/2013)  . INFLUENZA VACCINE  06/02/2016    Medications:   Medication List       This list is accurate as of: 03/06/16  1:59 PM.  Always use your most recent  med list.               folic acid 1 MG tablet  Commonly known as:  FOLVITE  Take 1 tablet (1 mg total) by mouth daily.     loperamide 2 MG capsule  Commonly known as:  IMODIUM  Take 2 mg by mouth as needed for diarrhea or loose stools.     Melatonin 3 MG Tabs  Take 3 mg by mouth at bedtime.     oxyCODONE-acetaminophen 5-325 MG tablet  Commonly known as:  PERCOCET/ROXICET  Take 1-2 tablets by mouth every 4 hours as needed for moderate to severe pain.     ranitidine 75 MG tablet  Commonly known as:  ZANTAC  Take 75 mg by mouth 2 (two)  times daily as needed (stomach ache). For indigestion     sertraline 25 MG tablet  Commonly known as:  ZOLOFT  Take 25 mg by mouth daily.     thiamine 100 MG tablet  Take 1 tablet (100 mg total) by mouth daily.     traZODone 50 MG tablet  Commonly known as:  DESYREL  Take 0.5 tablets (25 mg total) by mouth at bedtime as needed for sleep.     UNABLE TO FIND  Med Name: Med pass 120 mL by mouth 4 times daily as supplement with meals and bedtime        Review of Systems  Constitutional: Negative for fever, chills, activity change, appetite change and fatigue.  HENT: Negative.   Eyes: Negative.   Respiratory: Negative.   Cardiovascular: Negative.   Gastrointestinal: Negative.   Endocrine: Negative.   Genitourinary: Negative for dysuria, urgency, frequency and flank pain.  Musculoskeletal: Positive for gait problem.       Right leg pain   Skin: Negative.   Neurological: Negative for dizziness, seizures, syncope, light-headedness and headaches.  Psychiatric/Behavioral: Negative for suicidal ideas, hallucinations, confusion and agitation. The patient is not nervous/anxious.     Filed Vitals:   03/06/16 1341  BP: 124/88  Pulse: 82  Temp: 96.8 F (36 C)  Resp: 16  Height:  (1.676 m)  Weight: 105 lb 3.2 oz (47.718 kg)  SpO2: 95%   Body mass index is 16.99 kg/(m^2). Physical Exam  Constitutional: She is oriented to person, place, and time.  Thin Frail Elderly in no acute distress   HENT:  Head: Normocephalic.  Mouth/Throat: Oropharynx is clear and moist.  Eyes: Conjunctivae and EOM are normal. Pupils are equal, round, and reactive to light. Right eye exhibits no discharge. Left eye exhibits no discharge. No scleral icterus.  Neck: Normal range of motion. No JVD present. No thyromegaly present.  Cardiovascular: Normal rate, regular rhythm, normal heart sounds and intact distal pulses.  Exam reveals no gallop and no friction rub.   No murmur heard. Pulmonary/Chest:  Effort normal and breath sounds normal. No respiratory distress. She has no wheezes. She has no rales.  Abdominal: Soft. Bowel sounds are normal. She exhibits no distension. There is no tenderness. There is no rebound and no guarding.  Musculoskeletal: She exhibits no tenderness.  Bilateral Lower extremities +2 edema Ted hose not in place.   Lymphadenopathy:    She has no cervical adenopathy.  Neurological: She is oriented to person, place, and time.  Skin: Skin is warm and dry. No rash noted. No erythema. No pallor.  Psychiatric: She has a normal mood and affect.    Labs reviewed: Basic Metabolic Panel:  Recent Labs  16/10/96  [id number] 01/27/16 0419 01/28/16 01/28/16 0407 02/25/16  NA 133* 134* 135* 135 139  K 3.8 4.0  --  3.9 3.9  CL 95* 97*  --  97*  --   CO2 28 28  --  29  --   GLUCOSE 102* 111*  --  97  --   BUN 5* 7 9 9 10   CREATININE 0.35* 0.34* 0.4* 0.41* 0.5  CALCIUM 8.8* 8.8*  --  8.6*  --    Liver Function Tests:  Recent Labs  01/26/16 1957  AST 68*  ALT 27  ALKPHOS 144*  BILITOT 1.1  PROT 7.6  ALBUMIN 3.3*   No results for input(s): LIPASE, AMYLASE in the last 8760 hours. No results for input(s): AMMONIA in the last 8760 hours. CBC:  Recent Labs  01/26/16 1957 01/27/16 0419 01/28/16 01/28/16 0407 02/25/16  WBC 8.2 6.8 7.7 7.7 4.7  NEUTROABS 5.9  --   --   --   --   HGB 12.8 12.3  --  12.3 11.0*  HCT 36.0 34.2*  --  34.4* 32*  MCV 112.5* 113.6*  --  116.2*  --   PLT 206 203  --  215 248   Cardiac Enzymes: No results for input(s): CKTOTAL, CKMB, CKMBINDEX, TROPONINI in the last 8760 hours. BNP: Invalid input(s): POCBNP CBG: No results for input(s): GLUCAP in the last 8760 hours.  Procedures and Imaging Studies During Stay: No results found.  Assessment/Plan:   Unsteady Gait  Status post short term rehabilitation post hospital admission from 01/26/16-01/28/16 with right knee pain and swelling of her knee and right leg. DVT was ruled out. X ray  revealed right knee effusion and MRI revealed right lateral tibial plateau fracture. Orthopedic was consulted and NWB to RLE for 6 weeks and pain management was recommended. PT/OT to continue with ROM, exercise and muscle strengthening. Fall and safety precautions.   S/p  right closed Fracture of tibial plateau NWB continue with PT/OT. Follow up with Ortho as directed. Continue current pain regimen. Script written x 1 month supply.   Localized Edema  Encouraged to wear Bilateral knee high Ted hose on in the morning and off at bedtime.   Generalized Anxiety Has improved with Sertraline. Continue to monitor.  Depression Much improvement with Sertraline, Trazodone and Psychotherapy. Continue to monitor for mood changes.   Insomnia  Continue Trazodone and Melatonin   Rx written X 1 month supply   Patient is being discharged with the following home health services:   PT/OT to continue with ROM, exercise and muscle strengthening.   Patient is being discharged with the following durable medical equipment:    Standard wheelchair with elevating leg rests, cushion, anti tippers and extended brakes handles.    Patient has been advised to f/u with their PCP in 1-2 weeks to bring them up to date on their rehab stay.  Social services at facility was responsible for arranging this appointment.  Pt was provided with a 30 day supply of prescriptions for medications and refills must be obtained from their PCP.  For controlled substances, a more limited supply may be provided adequate until PCP appointment only.  Future labs/tests needed:  CBC, BMP in 1-2 weeks with PCP

## 2016-03-07 DIAGNOSIS — F330 Major depressive disorder, recurrent, mild: Secondary | ICD-10-CM | POA: Diagnosis not present

## 2016-03-07 DIAGNOSIS — F419 Anxiety disorder, unspecified: Secondary | ICD-10-CM | POA: Diagnosis not present

## 2016-03-10 DIAGNOSIS — F330 Major depressive disorder, recurrent, mild: Secondary | ICD-10-CM | POA: Diagnosis not present

## 2016-03-10 DIAGNOSIS — F419 Anxiety disorder, unspecified: Secondary | ICD-10-CM | POA: Diagnosis not present

## 2016-03-10 DIAGNOSIS — R269 Unspecified abnormalities of gait and mobility: Secondary | ICD-10-CM | POA: Diagnosis not present

## 2016-03-11 DIAGNOSIS — F329 Major depressive disorder, single episode, unspecified: Secondary | ICD-10-CM | POA: Diagnosis not present

## 2016-03-11 DIAGNOSIS — F411 Generalized anxiety disorder: Secondary | ICD-10-CM | POA: Diagnosis not present

## 2016-03-11 DIAGNOSIS — M1990 Unspecified osteoarthritis, unspecified site: Secondary | ICD-10-CM | POA: Diagnosis not present

## 2016-03-11 DIAGNOSIS — W2209XD Striking against other stationary object, subsequent encounter: Secondary | ICD-10-CM | POA: Diagnosis not present

## 2016-03-11 DIAGNOSIS — S82144D Nondisplaced bicondylar fracture of right tibia, subsequent encounter for closed fracture with routine healing: Secondary | ICD-10-CM | POA: Diagnosis not present

## 2016-03-11 DIAGNOSIS — M8580 Other specified disorders of bone density and structure, unspecified site: Secondary | ICD-10-CM | POA: Diagnosis not present

## 2016-04-06 DIAGNOSIS — M25551 Pain in right hip: Secondary | ICD-10-CM | POA: Diagnosis not present

## 2016-04-06 DIAGNOSIS — F411 Generalized anxiety disorder: Secondary | ICD-10-CM | POA: Diagnosis not present

## 2016-04-06 DIAGNOSIS — G4700 Insomnia, unspecified: Secondary | ICD-10-CM | POA: Diagnosis not present

## 2016-04-06 DIAGNOSIS — I10 Essential (primary) hypertension: Secondary | ICD-10-CM | POA: Diagnosis not present

## 2016-04-10 DIAGNOSIS — R269 Unspecified abnormalities of gait and mobility: Secondary | ICD-10-CM | POA: Diagnosis not present

## 2016-04-15 DIAGNOSIS — M5136 Other intervertebral disc degeneration, lumbar region: Secondary | ICD-10-CM | POA: Diagnosis not present

## 2016-04-15 DIAGNOSIS — S82141D Displaced bicondylar fracture of right tibia, subsequent encounter for closed fracture with routine healing: Secondary | ICD-10-CM | POA: Diagnosis not present

## 2016-04-15 DIAGNOSIS — M545 Low back pain: Secondary | ICD-10-CM | POA: Diagnosis not present

## 2016-05-04 ENCOUNTER — Inpatient Hospital Stay (HOSPITAL_COMMUNITY)
Admission: EM | Admit: 2016-05-04 | Discharge: 2016-05-11 | DRG: 086 | Disposition: A | Discharge status: Home Health | Payer: Medicare Other | Attending: Neurosurgery | Admitting: Neurosurgery

## 2016-05-04 ENCOUNTER — Emergency Department (HOSPITAL_COMMUNITY): Payer: Medicare Other

## 2016-05-04 ENCOUNTER — Encounter (HOSPITAL_COMMUNITY): Payer: Self-pay | Admitting: Emergency Medicine

## 2016-05-04 DIAGNOSIS — M545 Low back pain: Secondary | ICD-10-CM | POA: Diagnosis not present

## 2016-05-04 DIAGNOSIS — M4854XA Collapsed vertebra, not elsewhere classified, thoracic region, initial encounter for fracture: Secondary | ICD-10-CM | POA: Diagnosis present

## 2016-05-04 DIAGNOSIS — S0291XA Unspecified fracture of skull, initial encounter for closed fracture: Secondary | ICD-10-CM | POA: Diagnosis not present

## 2016-05-04 DIAGNOSIS — M1990 Unspecified osteoarthritis, unspecified site: Secondary | ICD-10-CM | POA: Diagnosis present

## 2016-05-04 DIAGNOSIS — R52 Pain, unspecified: Secondary | ICD-10-CM

## 2016-05-04 DIAGNOSIS — S06322A Contusion and laceration of left cerebrum with loss of consciousness of 31 minutes to 59 minutes, initial encounter: Secondary | ICD-10-CM

## 2016-05-04 DIAGNOSIS — E43 Unspecified severe protein-calorie malnutrition: Secondary | ICD-10-CM | POA: Insufficient documentation

## 2016-05-04 DIAGNOSIS — W19XXXA Unspecified fall, initial encounter: Secondary | ICD-10-CM | POA: Diagnosis present

## 2016-05-04 DIAGNOSIS — S06352A Traumatic hemorrhage of left cerebrum with loss of consciousness of 31 minutes to 59 minutes, initial encounter: Secondary | ICD-10-CM | POA: Diagnosis not present

## 2016-05-04 DIAGNOSIS — I609 Nontraumatic subarachnoid hemorrhage, unspecified: Secondary | ICD-10-CM | POA: Diagnosis not present

## 2016-05-04 DIAGNOSIS — S129XXA Fracture of neck, unspecified, initial encounter: Secondary | ICD-10-CM

## 2016-05-04 DIAGNOSIS — R102 Pelvic and perineal pain: Secondary | ICD-10-CM | POA: Diagnosis not present

## 2016-05-04 DIAGNOSIS — S12601A Unspecified nondisplaced fracture of seventh cervical vertebra, initial encounter for closed fracture: Secondary | ICD-10-CM | POA: Diagnosis not present

## 2016-05-04 DIAGNOSIS — S3993XA Unspecified injury of pelvis, initial encounter: Secondary | ICD-10-CM | POA: Diagnosis not present

## 2016-05-04 DIAGNOSIS — T148 Other injury of unspecified body region: Secondary | ICD-10-CM | POA: Diagnosis not present

## 2016-05-04 DIAGNOSIS — S22000A Wedge compression fracture of unspecified thoracic vertebra, initial encounter for closed fracture: Secondary | ICD-10-CM

## 2016-05-04 DIAGNOSIS — S12690A Other displaced fracture of seventh cervical vertebra, initial encounter for closed fracture: Secondary | ICD-10-CM | POA: Diagnosis not present

## 2016-05-04 DIAGNOSIS — L8990 Pressure ulcer of unspecified site, unspecified stage: Secondary | ICD-10-CM | POA: Insufficient documentation

## 2016-05-04 DIAGNOSIS — R269 Unspecified abnormalities of gait and mobility: Secondary | ICD-10-CM | POA: Diagnosis not present

## 2016-05-04 DIAGNOSIS — E559 Vitamin D deficiency, unspecified: Secondary | ICD-10-CM | POA: Diagnosis not present

## 2016-05-04 DIAGNOSIS — R509 Fever, unspecified: Secondary | ICD-10-CM | POA: Diagnosis not present

## 2016-05-04 DIAGNOSIS — S12031A Nondisplaced posterior arch fracture of first cervical vertebra, initial encounter for closed fracture: Secondary | ICD-10-CM | POA: Diagnosis not present

## 2016-05-04 DIAGNOSIS — S22010A Wedge compression fracture of first thoracic vertebra, initial encounter for closed fracture: Secondary | ICD-10-CM | POA: Diagnosis not present

## 2016-05-04 DIAGNOSIS — R402253 Coma scale, best verbal response, oriented, at hospital admission: Secondary | ICD-10-CM | POA: Diagnosis not present

## 2016-05-04 DIAGNOSIS — S020XXA Fracture of vault of skull, initial encounter for closed fracture: Secondary | ICD-10-CM | POA: Diagnosis not present

## 2016-05-04 DIAGNOSIS — S06309A Unspecified focal traumatic brain injury with loss of consciousness of unspecified duration, initial encounter: Secondary | ICD-10-CM

## 2016-05-04 DIAGNOSIS — S066X9A Traumatic subarachnoid hemorrhage with loss of consciousness of unspecified duration, initial encounter: Secondary | ICD-10-CM | POA: Diagnosis not present

## 2016-05-04 DIAGNOSIS — R402143 Coma scale, eyes open, spontaneous, at hospital admission: Secondary | ICD-10-CM | POA: Diagnosis not present

## 2016-05-04 DIAGNOSIS — S3992XA Unspecified injury of lower back, initial encounter: Secondary | ICD-10-CM | POA: Diagnosis not present

## 2016-05-04 DIAGNOSIS — R55 Syncope and collapse: Secondary | ICD-10-CM | POA: Diagnosis present

## 2016-05-04 DIAGNOSIS — Y92481 Parking lot as the place of occurrence of the external cause: Secondary | ICD-10-CM | POA: Diagnosis not present

## 2016-05-04 DIAGNOSIS — Z87891 Personal history of nicotine dependence: Secondary | ICD-10-CM | POA: Diagnosis not present

## 2016-05-04 DIAGNOSIS — S066X2A Traumatic subarachnoid hemorrhage with loss of consciousness of 31 minutes to 59 minutes, initial encounter: Secondary | ICD-10-CM | POA: Diagnosis not present

## 2016-05-04 DIAGNOSIS — R402363 Coma scale, best motor response, obeys commands, at hospital admission: Secondary | ICD-10-CM | POA: Diagnosis present

## 2016-05-04 DIAGNOSIS — M546 Pain in thoracic spine: Secondary | ICD-10-CM | POA: Diagnosis not present

## 2016-05-04 DIAGNOSIS — I6200 Nontraumatic subdural hemorrhage, unspecified: Secondary | ICD-10-CM | POA: Diagnosis not present

## 2016-05-04 DIAGNOSIS — S0280XA Fracture of other specified skull and facial bones, unspecified side, initial encounter for closed fracture: Secondary | ICD-10-CM | POA: Diagnosis not present

## 2016-05-04 DIAGNOSIS — S22018A Other fracture of first thoracic vertebra, initial encounter for closed fracture: Secondary | ICD-10-CM | POA: Diagnosis not present

## 2016-05-04 DIAGNOSIS — S12090A Other displaced fracture of first cervical vertebra, initial encounter for closed fracture: Secondary | ICD-10-CM | POA: Diagnosis not present

## 2016-05-04 LAB — CBC WITH DIFFERENTIAL/PLATELET
BASOS ABS: 0 10*3/uL (ref 0.0–0.1)
Basophils Relative: 0 %
EOS ABS: 0.1 10*3/uL (ref 0.0–0.7)
EOS PCT: 1 %
HCT: 28.2 % — ABNORMAL LOW (ref 36.0–46.0)
Hemoglobin: 9.6 g/dL — ABNORMAL LOW (ref 12.0–15.0)
Lymphocytes Relative: 15 %
Lymphs Abs: 1.5 10*3/uL (ref 0.7–4.0)
MCH: 37.5 pg — ABNORMAL HIGH (ref 26.0–34.0)
MCHC: 34 g/dL (ref 30.0–36.0)
MCV: 110.2 fL — ABNORMAL HIGH (ref 78.0–100.0)
MONO ABS: 1.3 10*3/uL — AB (ref 0.1–1.0)
Monocytes Relative: 13 %
NEUTROS PCT: 71 %
Neutro Abs: 7.2 10*3/uL (ref 1.7–7.7)
PLATELETS: 200 10*3/uL (ref 150–400)
RBC: 2.56 MIL/uL — AB (ref 3.87–5.11)
RDW: 12.1 % (ref 11.5–15.5)
WBC: 10.1 10*3/uL (ref 4.0–10.5)

## 2016-05-04 LAB — COMPREHENSIVE METABOLIC PANEL
ALK PHOS: 122 U/L (ref 38–126)
ALT: 16 U/L (ref 14–54)
ANION GAP: 8 (ref 5–15)
AST: 32 U/L (ref 15–41)
Albumin: 2 g/dL — ABNORMAL LOW (ref 3.5–5.0)
BUN: <5 mg/dL — ABNORMAL LOW (ref 6–20)
CALCIUM: 6.4 mg/dL — AB (ref 8.9–10.3)
CO2: 21 mmol/L — AB (ref 22–32)
Chloride: 107 mmol/L (ref 101–111)
Creatinine, Ser: 0.33 mg/dL — ABNORMAL LOW (ref 0.44–1.00)
GFR calc non Af Amer: >60 mL/min (ref >60)
Glucose, Bld: 86 mg/dL (ref 65–99)
Potassium: 2.8 mmol/L — ABNORMAL LOW (ref 3.5–5.1)
SODIUM: 136 mmol/L (ref 135–145)
TOTAL PROTEIN: 5.3 g/dL — AB (ref 6.5–8.1)
Total Bilirubin: 0.4 mg/dL (ref 0.3–1.2)

## 2016-05-04 LAB — RAPID URINE DRUG SCREEN, HOSP PERFORMED
Amphetamines: NOT DETECTED
BENZODIAZEPINES: NOT DETECTED
Barbiturates: NOT DETECTED
COCAINE: NOT DETECTED
OPIATES: POSITIVE — AB
Tetrahydrocannabinol: NOT DETECTED

## 2016-05-04 LAB — URINALYSIS, ROUTINE W REFLEX MICROSCOPIC
BILIRUBIN URINE: NEGATIVE
Glucose, UA: NEGATIVE mg/dL
HGB URINE DIPSTICK: NEGATIVE
Ketones, ur: NEGATIVE mg/dL
Leukocytes, UA: NEGATIVE
Nitrite: NEGATIVE
PROTEIN: NEGATIVE mg/dL
SPECIFIC GRAVITY, URINE: 1.007 (ref 1.005–1.030)
pH: 6.5 (ref 5.0–8.0)

## 2016-05-04 LAB — MRSA PCR SCREENING: MRSA BY PCR: NEGATIVE

## 2016-05-04 LAB — LACTIC ACID, PLASMA: LACTIC ACID, VENOUS: 2.5 mmol/L — AB (ref 0.5–1.9)

## 2016-05-04 LAB — CK: Total CK: 36 U/L — ABNORMAL LOW (ref 38–234)

## 2016-05-04 LAB — ETHANOL: ALCOHOL ETHYL (B): 252 mg/dL — AB (ref <5)

## 2016-05-04 LAB — CBG MONITORING, ED: GLUCOSE-CAPILLARY: 81 mg/dL (ref 65–99)

## 2016-05-04 MED ORDER — SODIUM CHLORIDE 0.9 % IV BOLUS (SEPSIS)
1000.0000 mL | Freq: Once | INTRAVENOUS | Status: AC
  Administered 2016-05-04: 1000 mL via INTRAVENOUS

## 2016-05-04 MED ORDER — FENTANYL CITRATE (PF) 100 MCG/2ML IJ SOLN
INTRAMUSCULAR | Status: AC
  Filled 2016-05-04: qty 2

## 2016-05-04 MED ORDER — FENTANYL CITRATE (PF) 100 MCG/2ML IJ SOLN
50.0000 ug | Freq: Once | INTRAMUSCULAR | Status: AC
Start: 2016-05-04 — End: 2016-05-04
  Administered 2016-05-04: 50 ug via INTRAVENOUS

## 2016-05-04 MED ORDER — ONDANSETRON HCL 4 MG/2ML IJ SOLN: 4.0000 mg | Freq: Three times a day (TID) | INTRAMUSCULAR | Status: AC | PRN

## 2016-05-04 MED ORDER — HYDROCODONE-ACETAMINOPHEN 5-325 MG PO TABS
1.0000 | ORAL_TABLET | ORAL | Status: DC | PRN
Start: 2016-05-04 — End: 2016-05-04

## 2016-05-04 MED ORDER — HYDROCODONE-ACETAMINOPHEN 5-325 MG PO TABS
2.0000 | ORAL_TABLET | ORAL | Status: DC | PRN
  Administered 2016-05-06 – 2016-05-07 (×3): 2 via ORAL
  Filled 2016-05-04 (×4): qty 2

## 2016-05-04 MED ORDER — POTASSIUM CHLORIDE 10 MEQ/100ML IV SOLN
10.0000 meq | Freq: Once | INTRAVENOUS | Status: AC
  Administered 2016-05-04: 10 meq via INTRAVENOUS
  Filled 2016-05-04: qty 100

## 2016-05-04 MED ORDER — FENTANYL CITRATE (PF) 100 MCG/2ML IJ SOLN
50.0000 ug | Freq: Once | INTRAMUSCULAR | Status: AC
  Administered 2016-05-04: 50 ug via INTRAVENOUS
  Filled 2016-05-04: qty 2

## 2016-05-04 MED ORDER — ACETAMINOPHEN 325 MG PO TABS
650.0000 mg | ORAL_TABLET | ORAL | Status: DC | PRN
  Administered 2016-05-08 – 2016-05-09 (×2): 650 mg via ORAL
  Filled 2016-05-04 (×3): qty 2

## 2016-05-04 MED ORDER — HYDROCODONE-ACETAMINOPHEN 5-325 MG PO TABS: 1.0000 | ORAL_TABLET | ORAL | Status: DC | PRN

## 2016-05-04 MED ORDER — ACETAMINOPHEN 325 MG PO TABS: 650.0000 mg | ORAL_TABLET | ORAL | Status: DC | PRN

## 2016-05-04 MED ORDER — POTASSIUM CHLORIDE CRYS ER 20 MEQ PO TBCR
10.0000 meq | EXTENDED_RELEASE_TABLET | Freq: Once | ORAL | Status: AC
  Administered 2016-05-04: 10 meq via ORAL
  Filled 2016-05-04: qty 1

## 2016-05-04 MED ORDER — HYDROCODONE-ACETAMINOPHEN 5-325 MG PO TABS
1.0000 | ORAL_TABLET | ORAL | Status: DC | PRN
  Administered 2016-05-04 – 2016-05-05 (×3): 1 via ORAL
  Filled 2016-05-04 (×3): qty 1

## 2016-05-04 MED ORDER — SODIUM CHLORIDE 0.9 % IV SOLN
INTRAVENOUS | Status: DC
  Administered 2016-05-05: 08:00:00 via INTRAVENOUS
  Filled 2016-05-04: qty 1000

## 2016-05-04 NOTE — ED Provider Notes (Signed)
CSN: [id number]     Arrival date & time 05/04/16  1523 History   None    Chief Complaint  Patient presents with  . Fall  . Back Pain     (Consider location/radiation/quality/duration/timing/severity/associated sxs/prior Treatment) Patient is a 70 y.o. female presenting with back pain and syncope. The history is provided by the patient and the EMS personnel.  Back Pain Location:  Gluteal region Quality:  Aching Associated symptoms: no abdominal pain, no chest pain, no dysuria, no fever, no headaches and no weakness   Loss of Consciousness Episode history:  Single Most recent episode:  Today Duration: unknown. Patient was found down in parking lot of doctor's office one hour after her appointment start time. She does not remember anything after getting ready to go to the doctor.  Timing:  Intermittent Progression:  Resolved Chronicity:  New Witnessed: no   Associated symptoms: confusion (thinks she's in the ambulance)   Associated symptoms: no chest pain, no diaphoresis, no difficulty breathing, no dizziness, no fever, no focal weakness, no headaches, no nausea, no recent surgery, no shortness of breath, no visual change, no vomiting and no weakness   Associated symptoms comment:  Single low systolic of 70 with EMS. Systolic 110s after 500 cc bolus.    Past Medical History  Diagnosis Date  . Vein, varicose   . Arthritis   . Vitamin D deficiency    Past Surgical History  Procedure Laterality Date  . Hip surgery    . Joint replacement     Family History  Problem Relation Age of Onset  . Adopted: Yes   Social History  Substance Use Topics  . Smoking status: Former Smoker    Quit date: 01/12/2013  . Smokeless tobacco: Never Used  . Alcohol Use: Yes     Comment: intermittent, unable to quantify   OB History    No data available     Review of Systems  Constitutional: Negative for fever and diaphoresis.  HENT: Negative for congestion.   Respiratory: Negative for  shortness of breath.   Cardiovascular: Positive for syncope. Negative for chest pain.  Gastrointestinal: Negative for nausea, vomiting, abdominal pain and abdominal distention.  Endocrine: Negative for polydipsia and polyuria.  Genitourinary: Negative for dysuria.  Musculoskeletal: Positive for back pain.  Neurological: Negative for dizziness, focal weakness, weakness and headaches.  Psychiatric/Behavioral: Positive for confusion (thinks she's in the ambulance). Negative for agitation.      Allergies  Review of patient's allergies indicates no known allergies.  Home Medications   Prior to Admission medications   Medication Sig Start Date End Date Taking? Authorizing Provider  oxyCODONE-acetaminophen (PERCOCET/ROXICET) 5-325 MG tablet Take 1-2 tablets by mouth every 4 hours as needed for moderate to severe pain.   Yes Historical Provider, MD  folic acid (FOLVITE) 1 MG tablet Take 1 tablet (1 mg total) by mouth daily. Patient not taking: Reported on 05/04/2016 01/28/16   Nita Sells Mikhail, DO  thiamine 100 MG tablet Take 1 tablet (100 mg total) by mouth daily. Patient not taking: Reported on 05/04/2016 01/28/16   Nita Sells Mikhail, DO  traZODone (DESYREL) 50 MG tablet Take 0.5 tablets (25 mg total) by mouth at bedtime as needed for sleep. Patient not taking: Reported on 05/04/2016 02/24/16   Donalee Citrin Ngetich, NP   BP 132/80 mmHg  Pulse 85  Temp(Src) 98.8 F (37.1 C) (Oral)  Resp 9  Ht 5\' 8"  (1.727 m)  Wt 46.2 kg  BMI 15.49 kg/m2  SpO2 95% Physical  Exam  Constitutional: She appears well-developed and well-nourished. No distress.  HENT:  Head: Normocephalic.  Swelling & bruising over the right eye. Dried blood at the nares. No visible laceration to the face or scalp.   Eyes: Pupils are equal, round, and reactive to light.  Neck: Normal range of motion.  C collar in place. No cervical tenderness. L spine tenderness.  Cardiovascular: Normal rate and normal heart sounds.   No murmur  heard. Pulmonary/Chest: Effort normal. No respiratory distress. She has no wheezes. She has no rales.  Abdominal: Soft. Bowel sounds are normal. She exhibits no distension. There is no tenderness. There is no rebound.  Musculoskeletal: She exhibits no edema.  Neurological: She is alert. No cranial nerve deficit.  Not oriented to place  Skin: Skin is warm. No rash noted. She is not diaphoretic. No erythema.  Nursing note and vitals reviewed.   ED Course  Procedures (including critical care time) Labs Review Labs Reviewed  CK - Abnormal; Notable for the following:    Total CK 36 (*)    All other components within normal limits  LACTIC ACID, PLASMA - Abnormal; Notable for the following:    Lactic Acid, Venous 2.5 (*)    All other components within normal limits  COMPREHENSIVE METABOLIC PANEL - Abnormal; Notable for the following:    Potassium 2.8 (*)    CO2 21 (*)    BUN <5 (*)    Creatinine, Ser 0.33 (*)    Calcium 6.4 (*)    Total Protein 5.3 (*)    Albumin 2.0 (*)    All other components within normal limits  CBC WITH DIFFERENTIAL/PLATELET - Abnormal; Notable for the following:    RBC 2.56 (*)    Hemoglobin 9.6 (*)    HCT 28.2 (*)    MCV 110.2 (*)    MCH 37.5 (*)    Monocytes Absolute 1.3 (*)    All other components within normal limits  ETHANOL - Abnormal; Notable for the following:    Alcohol, Ethyl (B) 252 (*)    All other components within normal limits  URINE RAPID DRUG SCREEN, HOSP PERFORMED - Abnormal; Notable for the following:    Opiates POSITIVE (*)    All other components within normal limits  MRSA PCR SCREENING  URINALYSIS, ROUTINE W REFLEX MICROSCOPIC (NOT AT [facility])  BASIC METABOLIC PANEL  CBG MONITORING, ED    Imaging Review Dg Lumbar Spine 2-3 Views  05/04/2016  CLINICAL DATA:  Fall with low back and sacral pain. EXAM: LUMBAR SPINE - 2-3 VIEW COMPARISON:  08/06/2008. FINDINGS: Bones are diffusely demineralized. There is mild loss of vertebral body  height at L4, unchanged in the interval, consistent with superior endplate compression fracture. Anterior wedge compression deformity T12 was present previously but has progressed slightly in the interval. No evidence for an acute fracture. Intervertebral disc spaces are preserved. IMPRESSION: Old compression fractures at T12 and L4.  No acute bony abnormality. Electronically Signed   By: Kennith CenterEric  Mansell M.D.   On: 05/04/2016 17:01   Dg Pelvis 1-2 Views  05/04/2016  CLINICAL DATA:  Fall with pelvic pain.  Worse on the right. EXAM: PELVIS - 1-2 VIEW COMPARISON:  10/03/2008 FINDINGS: Osteopenia. Left hip arthroplasty. Sacroiliac joints are symmetric. Osseous irregularity involving the inferior right pubic ramus, likely related to remote trauma. IMPRESSION: Osteopenia. No convincing evidence of acute osseous abnormality. Subtle osseous irregularity involving the inferior right pubic ramus is likely related to remote trauma, but appears new since 2009.  Osteopenia. Left hip arthroplasty. Electronically Signed   By: Jeronimo Greaves M.D.   On: 05/04/2016 16:54   Ct Head Wo Contrast  05/05/2016  CLINICAL DATA:  Follow-up subarachnoid hemorrhage. EXAM: CT HEAD WITHOUT CONTRAST TECHNIQUE: Contiguous axial images were obtained from the base of the skull through the vertex without intravenous contrast. COMPARISON:  CT HEAD May 04, 2016 FINDINGS: INTRACRANIAL CONTENTS: Interval blossoming of LEFT frontal lobe hemorrhagic contusions, the largest measures 2.2 x 1.9 cm. Surrounding low-density vasogenic edema and local mass effect. Trace LEFT frontal subarachnoid hemorrhage. 2 mm LEFT-to-RIGHT midline shift. LEFT frontal acute subdural hematoma focally measures up to 7 mm. 3 mm enlarging LEFT posterior acute subdural hematoma. Enlarging RIGHT holo hemispheric low-density 3 mm probable hygroma. No hydrocephalus. No acute large vascular territory infarcts. Basal cistern patent. ORBITS:  Ocular globes and orbital contents are  nonsuspicious. SINUSES: Scattered hemo sinus, most conspicuous in RIGHT sphenoid sinus. The mastoid air cells are well aerated. SKULL/SOFT TISSUES: No skull fracture. Small RIGHT parietal scalp hematoma. IMPRESSION: Interval blossoming LEFT frontal lobe hemorrhage contusions. 2 mm LEFT-to-RIGHT midline shift. Stable 7 mm LEFT frontal subdural hematoma. Increased 3 mm LEFT posterior acute subdural hematoma. Enlarging 3 mm RIGHT probable hygroma. Trace LEFT frontal subarachnoid hemorrhage. No skull fracture definitely identified, presence of paranasal hemo sinus concerning for occult fracture. Electronically Signed   By: Awilda Metro M.D.   On: 05/05/2016 06:22   Ct Head Wo Contrast  05/04/2016  CLINICAL DATA:  Found lying in the parking lot with bleeding. EXAM: CT HEAD WITHOUT CONTRAST CT CERVICAL SPINE WITHOUT CONTRAST TECHNIQUE: Multidetector CT imaging of the head and cervical spine was performed following the standard protocol without intravenous contrast. Multiplanar CT image reconstructions of the cervical spine were also generated. COMPARISON:  10/01/2008 FINDINGS: CT HEAD FINDINGS There are hemorrhagic contusions in the left frontal lobe, all less than 1 cm in size. There is a small amount of regional traumatic subarachnoid blood. There is an extra-axial hematoma along the greater wing of the sphenoid and left frontal bone, maximal thickness 8 mm. No significant mass effect upon the brain. No midline shift. There is a very small amount of posttraumatic subarachnoid blood overlying the right parietal region. Scalp swelling is present in that area. There is a nondisplaced fracture of the calvarium on the right at that site. There may be mild chronic small-vessel changes of the deep white matter. There is atherosclerotic calcification of the major vessels at the base of the brain. Blood layers in the right maxillary sinus. Blood fills the right division of the sphenoid sinus. Many of the ethmoid air cells  are opacified. I do not see a skullbase fracture, though the presence of blood in the sphenoid sinus is worrisome for an occult skullbase fracture. CT CERVICAL SPINE FINDINGS There is curvature convex to the left. No antero or retrolisthesis. There is a minor superior endplate compression fracture of C7. I think there are minor fractures also of the T1 and the T2 vertebral bodies, neither with significant loss of height or any retropulsed bone. No posterior element fracture is seen. There is extensive bilateral carotid bifurcation atherosclerotic calcification. Benign appearing scarring is present at both lung apices. IMPRESSION: Close head injury with trauma to the right frontoparietal region with scalp swelling. Nondisplaced underlying skull fracture. Small amount of adjacent traumatic subarachnoid hemorrhage on the right. Multiple sub cm hemorrhagic contusions in the left frontal lobe. Left frontal posttraumatic subarachnoid hemorrhage. Left-sided extra-axial collection, favored to represent a subdural hematoma,  maximal thickness 8 mm is without significant mass effect. I do not see regional fracture to suggest that this could be epidural. Blood in the right maxillary and sphenoid sinuses. This raises the possibility of an occult skullbase fracture, though I do not identify that. Mild compression fractures at C7, T1 and T2 with loss of height of less than 10%. No retropulsed bone or canal compromise. Critical Value/emergent results were called by telephone at the time of interpretation on 05/04/2016 at 4:55 pm to Dr. Linwood Dibbles , who verbally acknowledged these results. Electronically Signed   By: Paulina Fusi M.D.   On: 05/04/2016 16:55   Ct Cervical Spine Wo Contrast  05/04/2016  CLINICAL DATA:  Found lying in the parking lot with bleeding. EXAM: CT HEAD WITHOUT CONTRAST CT CERVICAL SPINE WITHOUT CONTRAST TECHNIQUE: Multidetector CT imaging of the head and cervical spine was performed following the standard  protocol without intravenous contrast. Multiplanar CT image reconstructions of the cervical spine were also generated. COMPARISON:  10/01/2008 FINDINGS: CT HEAD FINDINGS There are hemorrhagic contusions in the left frontal lobe, all less than 1 cm in size. There is a small amount of regional traumatic subarachnoid blood. There is an extra-axial hematoma along the greater wing of the sphenoid and left frontal bone, maximal thickness 8 mm. No significant mass effect upon the brain. No midline shift. There is a very small amount of posttraumatic subarachnoid blood overlying the right parietal region. Scalp swelling is present in that area. There is a nondisplaced fracture of the calvarium on the right at that site. There may be mild chronic small-vessel changes of the deep white matter. There is atherosclerotic calcification of the major vessels at the base of the brain. Blood layers in the right maxillary sinus. Blood fills the right division of the sphenoid sinus. Many of the ethmoid air cells are opacified. I do not see a skullbase fracture, though the presence of blood in the sphenoid sinus is worrisome for an occult skullbase fracture. CT CERVICAL SPINE FINDINGS There is curvature convex to the left. No antero or retrolisthesis. There is a minor superior endplate compression fracture of C7. I think there are minor fractures also of the T1 and the T2 vertebral bodies, neither with significant loss of height or any retropulsed bone. No posterior element fracture is seen. There is extensive bilateral carotid bifurcation atherosclerotic calcification. Benign appearing scarring is present at both lung apices. IMPRESSION: Close head injury with trauma to the right frontoparietal region with scalp swelling. Nondisplaced underlying skull fracture. Small amount of adjacent traumatic subarachnoid hemorrhage on the right. Multiple sub cm hemorrhagic contusions in the left frontal lobe. Left frontal posttraumatic subarachnoid  hemorrhage. Left-sided extra-axial collection, favored to represent a subdural hematoma, maximal thickness 8 mm is without significant mass effect. I do not see regional fracture to suggest that this could be epidural. Blood in the right maxillary and sphenoid sinuses. This raises the possibility of an occult skullbase fracture, though I do not identify that. Mild compression fractures at C7, T1 and T2 with loss of height of less than 10%. No retropulsed bone or canal compromise. Critical Value/emergent results were called by telephone at the time of interpretation on 05/04/2016 at 4:55 pm to Dr. Linwood Dibbles , who verbally acknowledged these results. Electronically Signed   By: Paulina Fusi M.D.   On: 05/04/2016 16:55   I have personally reviewed and evaluated these images and lab results as part of my medical decision-making.   EKG Interpretation  Date/Time:  Monday May 04 2016 15:54:34 EDT Ventricular Rate:  106 PR Interval:    QRS Duration: 86 QT Interval:  366 QTC Calculation: 486 R Axis:   75 Text Interpretation:  Sinus tachycardia Atrial premature complex  Nonspecific T abnormalities, lateral leads , new since last tracing  Borderline prolonged QT interval Confirmed by KNAPP  MD-J, JON ([id number]) on  05/04/2016 4:51:10 PM      MDM   Final diagnoses:  Intraparenchymal hematoma of brain, left, with loss of consciousness of 31 minutes to 59 minutes, initial encounter (HCC)  Subarachnoid bleed (HCC)  Fracture of skull with intracranial extra-axial hemorrhage with loss of consciousness, closed, initial encounter (HCC)  Compression fracture of C-spine, initial encounter (HCC)  Compression fracture of thoracic vertebra, closed, initial encounter [facility](HCC)   Patient found down in parking lot for unknown amount of time. Arrived disoriented, but orientation improved in the ED. CT head concerning for multiple intracranial bleeds and cervical & thoracic spine compression fractures. Patient primarily  complaining of buttock pain from an injury she states is more than 156 weeks old. Moving all extremities equally.   Patient given potassium for K 2.8. Calcium corrects to normal. Lactic acid 2.5.  Patient remained in C collar throughout stay in ED with no pain to C or T spine.   Patient admitted to Neuro ICU for further care.   Seen with Dr. Lynelle DoctorKnapp.     Levora AngelEric Kervens Roper, MD 05/05/16 1107  Linwood DibblesJon Knapp, MD 05/06/16 [phone]

## 2016-05-04 NOTE — H&P (Signed)
Reason for Consult:[facility] Referring Physician: [name] is an 70 y.o. female.  HPI: Pt presents to the ED after a possible syncopal episode and fall. She was going to her doctors office and was found lying outside on the ground and hour after the appointment.  She is amnestic for events of accident.  Past Medical History  Diagnosis Date  . Vein, varicose   . Arthritis   . Vitamin D deficiency     Past Surgical History  Procedure Laterality Date  . Hip surgery    . Joint replacement      Family History  Problem Relation Age of Onset  . Adopted: Yes    Social History:  reports that she quit smoking about 3 years ago. She has never used smokeless tobacco. She reports that she drinks alcohol. She reports that she does not use illicit drugs.  Allergies: No Known Allergies  Medications: I have reviewed the patient's current medications.  Results for orders placed or performed during the hospital encounter of 05/04/16 (from the past 48 hour(s))  CK     Status: Abnormal   Collection Time: 05/04/16  3:49 PM  Result Value Ref Range   Total CK 36 (L) 38 - 234 U/L  Lactic acid, plasma     Status: Abnormal   Collection Time: 05/04/16  3:49 PM  Result Value Ref Range   Lactic Acid, Venous 2.5 (HH) 0.5 - 1.9 mmol/L    Comment: CRITICAL RESULT CALLED TO, READ BACK BY AND VERIFIED WITHEster Rink RN [phone] GREEN R   Comprehensive metabolic panel     Status: Abnormal   Collection Time: 05/04/16  3:49 PM  Result Value Ref Range   Sodium 136 135 - 145 mmol/L   Potassium 2.8 (L) 3.5 - 5.1 mmol/L   Chloride 107 101 - 111 mmol/L   CO2 21 (L) 22 - 32 mmol/L   Glucose, Bld 86 65 - 99 mg/dL   BUN <5 (L) 6 - 20 mg/dL   Creatinine, Ser 0.33 (L) 0.44 - 1.00 mg/dL   Calcium 6.4 (LL) 8.9 - 10.3 mg/dL    Comment: CRITICAL RESULT CALLED TO, READ BACK BY AND VERIFIED WITH: CJustine Null RN [phone] [id number] GREEN R    Total Protein 5.3 (L) 6.5 - 8.1 g/dL   Albumin 2.0 (L) 3.5 - 5.0 g/dL   AST 32 15 - 41 U/L   ALT 16 14 - 54 U/L   Alkaline Phosphatase 122 38 - 126 U/L   Total Bilirubin 0.4 0.3 - 1.2 mg/dL   GFR calc non Af Amer >60 >60 mL/min   GFR calc Af Amer >60 >60 mL/min    Comment: (NOTE) The eGFR has been calculated using the CKD EPI equation. This calculation has not been validated in all clinical situations. eGFR's persistently <60 mL/min signify possible Chronic Kidney Disease.    Anion gap 8 5 - 15  CBC WITH DIFFERENTIAL     Status: Abnormal   Collection Time: 05/04/16  3:49 PM  Result Value Ref Range   WBC 10.1 4.0 - 10.5 K/uL   RBC 2.56 (L) 3.87 - 5.11 MIL/uL   Hemoglobin 9.6 (L) 12.0 - 15.0 g/dL   HCT 28.2 (L) 36.0 - 46.0 %   MCV 110.2 (H) 78.0 - 100.0 fL   MCH 37.5 (H) 26.0 - 34.0 pg   MCHC 34.0 30.0 - 36.0 g/dL   RDW 12.1 11.5 - 15.5 %   Platelets 200 150 -  400 K/uL   Neutrophils Relative % 71 %   Lymphocytes Relative 15 %   Monocytes Relative 13 %   Eosinophils Relative 1 %   Basophils Relative 0 %   Neutro Abs 7.2 1.7 - 7.7 K/uL   Lymphs Abs 1.5 0.7 - 4.0 K/uL   Monocytes Absolute 1.3 (H) 0.1 - 1.0 K/uL   Eosinophils Absolute 0.1 0.0 - 0.7 K/uL   Basophils Absolute 0.0 0.0 - 0.1 K/uL   Smear Review MORPHOLOGY UNREMARKABLE   Ethanol     Status: Abnormal   Collection Time: 05/04/16  3:49 PM  Result Value Ref Range   Alcohol, Ethyl (B) 252 (H) <5 mg/dL    Comment:        LOWEST DETECTABLE LIMIT FOR SERUM ALCOHOL IS 5 mg/dL FOR MEDICAL PURPOSES ONLY   CBG monitoring, ED     Status: None   Collection Time: 05/04/16  5:17 PM  Result Value Ref Range   Glucose-Capillary 81 65 - 99 mg/dL    Dg Lumbar Spine 2-3 Views  05/04/2016  CLINICAL DATA:  Fall with low back and sacral pain. EXAM: LUMBAR SPINE - 2-3 VIEW COMPARISON:  08/06/2008. FINDINGS: Bones are diffusely demineralized. There is mild loss of vertebral body height at L4, unchanged in the interval, consistent with superior endplate compression fracture. Anterior wedge compression  deformity T12 was present previously but has progressed slightly in the interval. No evidence for an acute fracture. Intervertebral disc spaces are preserved. IMPRESSION: Old compression fractures at T12 and L4.  No acute bony abnormality. Electronically Signed   By: Mackenzie [name] M.D.   On: 05/04/2016 17:01   Dg Pelvis 1-2 Views  05/04/2016  CLINICAL DATA:  Fall with pelvic pain.  Worse on the right. EXAM: PELVIS - 1-2 VIEW COMPARISON:  10/03/2008 FINDINGS: Osteopenia. Left hip arthroplasty. Sacroiliac joints are symmetric. Osseous irregularity involving the inferior right pubic ramus, likely related to remote trauma. IMPRESSION: Osteopenia. No convincing evidence of acute osseous abnormality. Subtle osseous irregularity involving the inferior right pubic ramus is likely related to remote trauma, but appears new since 2009. Osteopenia. Left hip arthroplasty. Electronically Signed   By: Mackenzie [name] M.D.   On: 05/04/2016 16:54   Ct Head Wo Contrast  05/04/2016  CLINICAL DATA:  Found lying in the parking lot with bleeding. EXAM: CT HEAD WITHOUT CONTRAST CT CERVICAL SPINE WITHOUT CONTRAST TECHNIQUE: Multidetector CT imaging of the head and cervical spine was performed following the standard protocol without intravenous contrast. Multiplanar CT image reconstructions of the cervical spine were also generated. COMPARISON:  10/01/2008 FINDINGS: CT HEAD FINDINGS There are hemorrhagic contusions in the left frontal lobe, all less than 1 cm in size. There is a small amount of regional traumatic subarachnoid blood. There is an extra-axial hematoma along the greater wing of the sphenoid and left frontal bone, maximal thickness 8 mm. No significant mass effect upon the brain. No midline shift. There is a very small amount of posttraumatic subarachnoid blood overlying the right parietal region. Scalp swelling is present in that area. There is a nondisplaced fracture of the calvarium on the right at that site. There may be  mild chronic small-vessel changes of the deep white matter. There is atherosclerotic calcification of the major vessels at the base of the brain. Blood layers in the right maxillary sinus. Blood fills the right division of the sphenoid sinus. Many of the ethmoid air cells are opacified. I do not see a skullbase fracture, though the presence of  blood in the sphenoid sinus is worrisome for an occult skullbase fracture. CT CERVICAL SPINE FINDINGS There is curvature convex to the left. No antero or retrolisthesis. There is a minor superior endplate compression fracture of C7. I think there are minor fractures also of the T1 and the T2 vertebral bodies, neither with significant loss of height or any retropulsed bone. No posterior element fracture is seen. There is extensive bilateral carotid bifurcation atherosclerotic calcification. Benign appearing scarring is present at both lung apices. IMPRESSION: Close head injury with trauma to the right frontoparietal region with scalp swelling. Nondisplaced underlying skull fracture. Small amount of adjacent traumatic subarachnoid hemorrhage on the right. Multiple sub cm hemorrhagic contusions in the left frontal lobe. Left frontal posttraumatic subarachnoid hemorrhage. Left-sided extra-axial collection, favored to represent a subdural hematoma, maximal thickness 8 mm is without significant mass effect. I do not see regional fracture to suggest that this could be epidural. Blood in the right maxillary and sphenoid sinuses. This raises the possibility of an occult skullbase fracture, though I do not identify that. Mild compression fractures at C7, T1 and T2 with loss of height of less than 10%. No retropulsed bone or canal compromise. Critical Value/emergent results were called by telephone at the time of interpretation on 05/04/2016 at 4:55 pm to Dr. Dorie Rank , who verbally acknowledged these results. Electronically Signed   By: Nelson Chimes M.D.   On: 05/04/2016 16:55   Ct  Cervical Spine Wo Contrast  05/04/2016  CLINICAL DATA:  Found lying in the parking lot with bleeding. EXAM: CT HEAD WITHOUT CONTRAST CT CERVICAL SPINE WITHOUT CONTRAST TECHNIQUE: Multidetector CT imaging of the head and cervical spine was performed following the standard protocol without intravenous contrast. Multiplanar CT image reconstructions of the cervical spine were also generated. COMPARISON:  10/01/2008 FINDINGS: CT HEAD FINDINGS There are hemorrhagic contusions in the left frontal lobe, all less than 1 cm in size. There is a small amount of regional traumatic subarachnoid blood. There is an extra-axial hematoma along the greater wing of the sphenoid and left frontal bone, maximal thickness 8 mm. No significant mass effect upon the brain. No midline shift. There is a very small amount of posttraumatic subarachnoid blood overlying the right parietal region. Scalp swelling is present in that area. There is a nondisplaced fracture of the calvarium on the right at that site. There may be mild chronic small-vessel changes of the deep white matter. There is atherosclerotic calcification of the major vessels at the base of the brain. Blood layers in the right maxillary sinus. Blood fills the right division of the sphenoid sinus. Many of the ethmoid air cells are opacified. I do not see a skullbase fracture, though the presence of blood in the sphenoid sinus is worrisome for an occult skullbase fracture. CT CERVICAL SPINE FINDINGS There is curvature convex to the left. No antero or retrolisthesis. There is a minor superior endplate compression fracture of C7. I think there are minor fractures also of the T1 and the T2 vertebral bodies, neither with significant loss of height or any retropulsed bone. No posterior element fracture is seen. There is extensive bilateral carotid bifurcation atherosclerotic calcification. Benign appearing scarring is present at both lung apices. IMPRESSION: Close head injury with trauma  to the right frontoparietal region with scalp swelling. Nondisplaced underlying skull fracture. Small amount of adjacent traumatic subarachnoid hemorrhage on the right. Multiple sub cm hemorrhagic contusions in the left frontal lobe. Left frontal posttraumatic subarachnoid hemorrhage. Left-sided extra-axial collection, favored  to represent a subdural hematoma, maximal thickness 8 mm is without significant mass effect. I do not see regional fracture to suggest that this could be epidural. Blood in the right maxillary and sphenoid sinuses. This raises the possibility of an occult skullbase fracture, though I do not identify that. Mild compression fractures at C7, T1 and T2 with loss of height of less than 10%. No retropulsed bone or canal compromise. Critical Value/emergent results were called by telephone at the time of interpretation on 05/04/2016 at 4:55 pm to Dr. Dorie Rank , who verbally acknowledged these results. Electronically Signed   By: Nelson Chimes M.D.   On: 05/04/2016 16:55    Review of Systems - Negative except osteoporosis    Blood pressure 165/105, pulse 105, temperature 98.6 F (37 Mackenzie), temperature source Oral, resp. rate 17, height '5\' 8"'  (1.727 m), weight 46.2 kg (101 lb 13.6 oz), SpO2 98 %. Physical Exam  Constitutional: She is oriented to person, place, and time. She appears well-developed and well-nourished.  HENT:  Head: Normocephalic. Head is with raccoon's eyes.    Nose:    Eyes: Conjunctivae and EOM are normal. Pupils are equal, round, and reactive to light. Lids are everted and swept, no foreign bodies found.  Neck: Spinous process tenderness and muscular tenderness present.  Cardiovascular: Normal rate and regular rhythm.   Respiratory: Effort normal.  Neurological: She is alert and oriented to person, place, and time. She has normal strength and normal reflexes. No cranial nerve deficit or sensory deficit. GCS eye subscore is 4. GCS verbal subscore is 5. GCS motor subscore  is 6.  No drift.  Full power both upper and lower extremities.    Assessment/Plan: Patient was found in a pool of blood after falling and striking her head.  She is amnestic for events of accident, but is now awake and alert and oriented.  She will be observed in ICU with repeat head CT in AM.  Patient also has mild compression fractures of Mackenzie 7 and T 1, for which she should continue in a cervical collar.  Peggyann Shoals, MD 05/04/2016, 7:21 PM

## 2016-05-04 NOTE — ED Notes (Signed)
Per [facility] patient was found laying in a pool of congealed blood on a [place] parking lot outside of a doctor's office an hour late for said appointment.  Patient states the last thing she remembers is arriving at her doctor's office.  Patient is alert and oriented at this time complaining of lower back pain.  Initial BP on EMS arrival was 78/54, patient received NS bolus, pressure 124/81.  [facility] sprinkled normal saline over patient to reduce temperature from laying on [place] for extended length of time.

## 2016-05-04 NOTE — ED Provider Notes (Signed)
Pt presents to the ED after a possible syncopal episode and fall.  She was going to her doctors office and was found lying outside on the ground and hour after the appointment. Physical Exam  BP 154/95 mmHg  Pulse 103  Temp(Src) 98.6 F (37 C) (Oral)  Resp 11  SpO2 96%  Physical Exam  Constitutional: She is oriented to person, place, and time. She appears well-developed and well-nourished. No distress.  HENT:  Head: Normocephalic.  Right Ear: External ear normal.  Left Ear: External ear normal.  Facial contusion, blood around the nares, no scalp laceration noted  Eyes: Conjunctivae are normal. Right eye exhibits no discharge. Left eye exhibits no discharge. No scleral icterus.  Neck: Neck supple. No tracheal deviation present.  Cardiovascular: Normal rate.   Pulmonary/Chest: Effort normal. No stridor. No respiratory distress.  Musculoskeletal: She exhibits no edema.  Neurological: She is alert and oriented to person, place, and time. She displays no tremor. No cranial nerve deficit ( ) or sensory deficit. She displays no seizure activity.  Skin: Skin is warm and dry. No rash noted.  Psychiatric: She has a normal mood and affect.  Tearful   Nursing note and vitals reviewed.   ED Course  Procedures  EKG Interpretation  Date/Time:  Monday May 04 2016 15:54:34 EDT Ventricular Rate:  106 PR Interval:    QRS Duration: 86 QT Interval:  366 QTC Calculation: 486 R Axis:   75 Text Interpretation:  Sinus tachycardia Atrial premature complex Nonspecific T abnormalities, lateral leads , new since last tracing Borderline prolonged QT interval Confirmed by Ronzell Laban  MD-J, Zevin Nevares ([id number]) on 05/04/2016 4:51:10 PM      Dg Lumbar Spine 2-3 Views  05/04/2016  CLINICAL DATA:  Fall with low back and sacral pain. EXAM: LUMBAR SPINE - 2-3 VIEW COMPARISON:  08/06/2008. FINDINGS: Bones are diffusely demineralized. There is mild loss of vertebral body height at L4, unchanged in the interval, consistent  with superior endplate compression fracture. Anterior wedge compression deformity T12 was present previously but has progressed slightly in the interval. No evidence for an acute fracture. Intervertebral disc spaces are preserved. IMPRESSION: Old compression fractures at T12 and L4.  No acute bony abnormality. Electronically Signed   By: Kennith CenterEric  Mansell M.D.   On: 05/04/2016 17:01   Dg Pelvis 1-2 Views  05/04/2016  CLINICAL DATA:  Fall with pelvic pain.  Worse on the right. EXAM: PELVIS - 1-2 VIEW COMPARISON:  10/03/2008 FINDINGS: Osteopenia. Left hip arthroplasty. Sacroiliac joints are symmetric. Osseous irregularity involving the inferior right pubic ramus, likely related to remote trauma. IMPRESSION: Osteopenia. No convincing evidence of acute osseous abnormality. Subtle osseous irregularity involving the inferior right pubic ramus is likely related to remote trauma, but appears new since 2009. Osteopenia. Left hip arthroplasty. Electronically Signed   By: Jeronimo GreavesKyle  Talbot M.D.   On: 05/04/2016 16:54   Ct Head Wo Contrast  05/04/2016  CLINICAL DATA:  Found lying in the parking lot with bleeding. EXAM: CT HEAD WITHOUT CONTRAST CT CERVICAL SPINE WITHOUT CONTRAST TECHNIQUE: Multidetector CT imaging of the head and cervical spine was performed following the standard protocol without intravenous contrast. Multiplanar CT image reconstructions of the cervical spine were also generated. COMPARISON:  10/01/2008 FINDINGS: CT HEAD FINDINGS There are hemorrhagic contusions in the left frontal lobe, all less than 1 cm in size. There is a small amount of regional traumatic subarachnoid blood. There is an extra-axial hematoma along the greater wing of the sphenoid and left frontal bone,  maximal thickness 8 mm. No significant mass effect upon the brain. No midline shift. There is a very small amount of posttraumatic subarachnoid blood overlying the right parietal region. Scalp swelling is present in that area. There is a  nondisplaced fracture of the calvarium on the right at that site. There may be mild chronic small-vessel changes of the deep white matter. There is atherosclerotic calcification of the major vessels at the base of the brain. Blood layers in the right maxillary sinus. Blood fills the right division of the sphenoid sinus. Many of the ethmoid air cells are opacified. I do not see a skullbase fracture, though the presence of blood in the sphenoid sinus is worrisome for an occult skullbase fracture. CT CERVICAL SPINE FINDINGS There is curvature convex to the left. No antero or retrolisthesis. There is a minor superior endplate compression fracture of C7. I think there are minor fractures also of the T1 and the T2 vertebral bodies, neither with significant loss of height or any retropulsed bone. No posterior element fracture is seen. There is extensive bilateral carotid bifurcation atherosclerotic calcification. Benign appearing scarring is present at both lung apices. IMPRESSION: Close head injury with trauma to the right frontoparietal region with scalp swelling. Nondisplaced underlying skull fracture. Small amount of adjacent traumatic subarachnoid hemorrhage on the right. Multiple sub cm hemorrhagic contusions in the left frontal lobe. Left frontal posttraumatic subarachnoid hemorrhage. Left-sided extra-axial collection, favored to represent a subdural hematoma, maximal thickness 8 mm is without significant mass effect. I do not see regional fracture to suggest that this could be epidural. Blood in the right maxillary and sphenoid sinuses. This raises the possibility of an occult skullbase fracture, though I do not identify that. Mild compression fractures at C7, T1 and T2 with loss of height of less than 10%. No retropulsed bone or canal compromise. Critical Value/emergent results were called by telephone at the time of interpretation on 05/04/2016 at 4:55 pm to Dr. Linwood DibblesJon Floreine Kingdon , who verbally acknowledged these results.  Electronically Signed   By: Paulina FusiMark  Shogry M.D.   On: 05/04/2016 16:55   Ct Cervical Spine Wo Contrast  05/04/2016  CLINICAL DATA:  Found lying in the parking lot with bleeding. EXAM: CT HEAD WITHOUT CONTRAST CT CERVICAL SPINE WITHOUT CONTRAST TECHNIQUE: Multidetector CT imaging of the head and cervical spine was performed following the standard protocol without intravenous contrast. Multiplanar CT image reconstructions of the cervical spine were also generated. COMPARISON:  10/01/2008 FINDINGS: CT HEAD FINDINGS There are hemorrhagic contusions in the left frontal lobe, all less than 1 cm in size. There is a small amount of regional traumatic subarachnoid blood. There is an extra-axial hematoma along the greater wing of the sphenoid and left frontal bone, maximal thickness 8 mm. No significant mass effect upon the brain. No midline shift. There is a very small amount of posttraumatic subarachnoid blood overlying the right parietal region. Scalp swelling is present in that area. There is a nondisplaced fracture of the calvarium on the right at that site. There may be mild chronic small-vessel changes of the deep white matter. There is atherosclerotic calcification of the major vessels at the base of the brain. Blood layers in the right maxillary sinus. Blood fills the right division of the sphenoid sinus. Many of the ethmoid air cells are opacified. I do not see a skullbase fracture, though the presence of blood in the sphenoid sinus is worrisome for an occult skullbase fracture. CT CERVICAL SPINE FINDINGS There is curvature convex to  the left. No antero or retrolisthesis. There is a minor superior endplate compression fracture of C7. I think there are minor fractures also of the T1 and the T2 vertebral bodies, neither with significant loss of height or any retropulsed bone. No posterior element fracture is seen. There is extensive bilateral carotid bifurcation atherosclerotic calcification. Benign appearing scarring  is present at both lung apices. IMPRESSION: Close head injury with trauma to the right frontoparietal region with scalp swelling. Nondisplaced underlying skull fracture. Small amount of adjacent traumatic subarachnoid hemorrhage on the right. Multiple sub cm hemorrhagic contusions in the left frontal lobe. Left frontal posttraumatic subarachnoid hemorrhage. Left-sided extra-axial collection, favored to represent a subdural hematoma, maximal thickness 8 mm is without significant mass effect. I do not see regional fracture to suggest that this could be epidural. Blood in the right maxillary and sphenoid sinuses. This raises the possibility of an occult skullbase fracture, though I do not identify that. Mild compression fractures at C7, T1 and T2 with loss of height of less than 10%. No retropulsed bone or canal compromise. Critical Value/emergent results were called by telephone at the time of interpretation on 05/04/2016 at 4:55 pm to Dr. Linwood Dibbles , who verbally acknowledged these results. Electronically Signed   By: Paulina Fusi M.D.   On: 05/04/2016 16:55    MDM  Multiple findings noted on CT scan.  Pt has remained stable.  Dr Venetia Maxon, neurosurgery was contacted.  Pt will be admitted to the hospital for further treatment.     Linwood Dibbles, MD 05/04/16 Zollie Pee

## 2016-05-05 ENCOUNTER — Encounter (HOSPITAL_COMMUNITY): Payer: Self-pay | Admitting: *Deleted

## 2016-05-05 ENCOUNTER — Inpatient Hospital Stay (HOSPITAL_COMMUNITY): Payer: Medicare Other

## 2016-05-05 LAB — BASIC METABOLIC PANEL
ANION GAP: 8 (ref 5–15)
BUN: <5 mg/dL — ABNORMAL LOW (ref 6–20)
CO2: 28 mmol/L (ref 22–32)
Calcium: 8.3 mg/dL — ABNORMAL LOW (ref 8.9–10.3)
Chloride: 102 mmol/L (ref 101–111)
Creatinine, Ser: 0.53 mg/dL (ref 0.44–1.00)
GLUCOSE: 160 mg/dL — AB (ref 65–99)
POTASSIUM: 3.5 mmol/L (ref 3.5–5.1)
Sodium: 138 mmol/L (ref 135–145)

## 2016-05-05 MED ORDER — MORPHINE SULFATE (PF) 2 MG/ML IV SOLN
2.0000 mg | INTRAVENOUS | Status: DC | PRN
  Administered 2016-05-05 – 2016-05-09 (×13): 2 mg via INTRAVENOUS
  Filled 2016-05-05 (×13): qty 1

## 2016-05-05 MED ORDER — VITAMIN B-1 100 MG PO TABS
100.0000 mg | ORAL_TABLET | Freq: Every day | ORAL | Status: DC
  Administered 2016-05-05 – 2016-05-10 (×6): 100 mg via ORAL
  Filled 2016-05-05 (×6): qty 1

## 2016-05-05 MED ORDER — FOLIC ACID 1 MG PO TABS
1.0000 mg | ORAL_TABLET | Freq: Every day | ORAL | Status: DC
  Administered 2016-05-05 – 2016-05-10 (×6): 1 mg via ORAL
  Filled 2016-05-05 (×6): qty 1

## 2016-05-05 MED ORDER — LORAZEPAM 2 MG/ML IJ SOLN: 1.0000 mg | Freq: Four times a day (QID) | INTRAMUSCULAR | Status: AC | PRN

## 2016-05-05 MED ORDER — LORAZEPAM 2 MG/ML IJ SOLN
0.0000 mg | Freq: Two times a day (BID) | INTRAMUSCULAR | Status: AC
  Administered 2016-05-08: 1 mg via INTRAVENOUS
  Administered 2016-05-08: 2 mg via INTRAVENOUS
  Filled 2016-05-05 (×2): qty 1

## 2016-05-05 MED ORDER — THIAMINE HCL 100 MG/ML IJ SOLN: 100.0000 mg | Freq: Every day | INTRAMUSCULAR | Status: DC

## 2016-05-05 MED ORDER — ADULT MULTIVITAMIN W/MINERALS CH
1.0000 | ORAL_TABLET | Freq: Every day | ORAL | Status: DC
  Administered 2016-05-05 – 2016-05-10 (×6): 1 via ORAL
  Filled 2016-05-05 (×6): qty 1

## 2016-05-05 MED ORDER — LORAZEPAM 1 MG PO TABS: 1.0000 mg | ORAL_TABLET | Freq: Four times a day (QID) | ORAL | Status: AC | PRN

## 2016-05-05 MED ORDER — ENSURE ENLIVE PO LIQD
237.0000 mL | Freq: Three times a day (TID) | ORAL | Status: DC
  Administered 2016-05-05 – 2016-05-10 (×16): 237 mL via ORAL

## 2016-05-05 MED ORDER — OXYCODONE-ACETAMINOPHEN 5-325 MG PO TABS
1.0000 | ORAL_TABLET | ORAL | Status: DC | PRN
  Administered 2016-05-05 – 2016-05-08 (×8): 2 via ORAL
  Administered 2016-05-09 (×3): 1 via ORAL
  Administered 2016-05-10 – 2016-05-11 (×8): 2 via ORAL
  Filled 2016-05-05 (×4): qty 2
  Filled 2016-05-05 (×2): qty 1
  Filled 2016-05-05 (×7): qty 2
  Filled 2016-05-05: qty 1
  Filled 2016-05-05 (×5): qty 2

## 2016-05-05 MED ORDER — LORAZEPAM 2 MG/ML IJ SOLN
0.0000 mg | Freq: Four times a day (QID) | INTRAMUSCULAR | Status: AC
  Administered 2016-05-06 (×2): 1 mg via INTRAVENOUS
  Administered 2016-05-07: 2 mg via INTRAVENOUS
  Filled 2016-05-05: qty 1
  Filled 2016-05-05: qty 2

## 2016-05-05 NOTE — Evaluation (Signed)
Speech Language Pathology Evaluation Patient Details Name: Mackenzie SinghMartha F [name] MRN: [id number] DOB: 04-08-1946 Today's Date: 05/05/2016 Time: [id number] SLP Time Calculation (min) (ACUTE ONLY): 12 min  Problem List:  Patient Active Problem List   Diagnosis Date Noted  . Subarachnoid bleed (HCC) 05/04/2016  . Pressure ulcer 05/04/2016  . Unsteady gait 03/06/2016  . Insomnia 03/06/2016  . Generalized anxiety disorder 02/24/2016  . Depression 02/24/2016  . Edema 02/24/2016  . Moderate malnutrition (HCC)   . Right knee pain 01/26/2016  . 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 ARTHRALGIA 11/29/2009  . CBC, ABNORMAL 11/29/2009  . VITAMIN D DEFICIENCY 10/21/2009  . SEBACEOUS CYST, NECK 10/18/2009  . CAROTID BRUIT 10/18/2009   Past Medical History:  Past Medical History  Diagnosis Date  . Vein, varicose   . Arthritis   . Vitamin D deficiency    Past Surgical History:  Past Surgical History  Procedure Laterality Date  . Hip surgery    . Joint replacement     HPI:  Pt is a 70 y.o. female presenting to ED on 7/3 after a possible syncopal episode and fall. Pt is amnestic for events of accident. Head CT showed L frontal lobe hemorrhage contusions, 2 mm L- to-R midline shift; stable L frontal subdural hematoma, Increased 3 mm LEFT posterior acute subdural hematoma. Enlarging 3 mm RIGHT probable hygroma. Trace LEFT frontal subarachnoid hemorrhage. Pt was inebriated at admission with opiates also present. Speech-language eval ordered.    Assessment / Plan / Recommendation Clinical Impression  Pt currently presenting with mild cognitive deficits for tasks assessed. Evaluation was limited due to pt's head pain which resulted in difficulty focusing on tasks presented. [facility] of the [facility] were administered; pt demonstrated no difficulties or answering orientation questions related to self, time, and location. Pt somewhat disoriented to situation- states "I  went in for a routine check-up" but did not mention having a fall. Pt had difficulties with immediate recall of word list for memory task, did not attempt delayed recall task. Motor speech and receptive/ expressive language skills were within functional limits for tasks assessed. Given these findings, pt would benefit from continued speech therapy in the acute care setting to further assess cognition and to provide treatment for short-term memory skills, as deficits in this area could result in decreased ability to recall therapist recommendations. Will continue to follow.    SLP Assessment  Patient needs continued Speech Lanaguage Pathology Services    Follow Up Recommendations  Other (comment) (TBD)    Frequency and Duration min 1 x/week  1 week      SLP Evaluation Prior Functioning  Cognitive/Linguistic Baseline: Information not available Type of Home: House  Lives With: Other (Comment) (roommates) Available Help at Discharge: Friend(s);Available PRN/intermittently   Cognition  Overall Cognitive Status: Impaired/Different from baseline Arousal/Alertness: Awake/alert Orientation Level: Oriented to person;Oriented to place;Oriented to time;Disoriented to situation Attention: Selective Selective Attention:  (difficult to assess- pt focused on pain) Memory: Impaired Memory Impairment: Decreased recall of new information Awareness: Appears intact Safety/Judgment: Appears intact    Comprehension  Auditory Comprehension Overall Auditory Comprehension: Appears within functional limits for tasks assessed Yes/No Questions: Within Functional Limits Conversation: Simple Interfering Components: Pain    Expression Expression Primary Mode of Expression: Verbal Verbal Expression Overall Verbal Expression: Appears within functional limits for tasks assessed Initiation: No impairment Level of Generative/Spontaneous Verbalization: Conversation Naming: No impairment Pragmatics: No  impairment Non-Verbal Means of Communication: Not applicable   Oral /  Motor  Oral Motor/Sensory Function Overall Oral Motor/Sensory Function: Within functional limits Motor Speech Overall Motor Speech: Appears within functional limits for tasks assessed Respiration: Within functional limits Phonation: Normal Resonance: Within functional limits Articulation: Within functional limitis Intelligibility: Intelligible Motor Planning: Witnin functional limits Motor Speech Errors: Not applicable   GO                    Metro KungOleksiak, Judah Chevere K, MA, CCC-SLP 05/05/2016, 1:23 PM [phone]

## 2016-05-05 NOTE — Progress Notes (Signed)
Patient ID: Mackenzie [name], female   DOB: 15-May-1946, 70 y.o.   MRN: [id number] BP 153/95 mmHg  Pulse 90  Temp(Src) 98.8 F (37.1 C) (Oral)  Resp 13  Ht 5\' 8"  (1.727 m)  Wt 46.2 kg (101 lb 13.6 oz)  BMI 15.49 kg/m2  SpO2 94% Alert, oriented x 4, speech is clear and fluent Moving all extremities well, no drift Head CT shows progression and enlargement of the left frontal lobe contusion. No significant shift, no operative indications at this time.  Will place on delirium tremens precautions as she was inebriated at admission. Opiates also present, though patient denies taking opiates.  Neuro exam remains good, better than scans.

## 2016-05-05 NOTE — Progress Notes (Signed)
Initial Nutrition Assessment  DOCUMENTATION CODES:   Severe malnutrition in context of chronic illness, Underweight  INTERVENTION:  Provide Ensure Enlive po TID, each supplement provides 350 kcal and 20 grams of protein.  Encourage adequate PO intake.   NUTRITION DIAGNOSIS:   Malnutrition related to chronic illness as evidenced by severe depletion of body fat, severe depletion of muscle mass.  GOAL:   Patient will meet greater than or equal to 90% of their needs  MONITOR:   PO intake, Supplement acceptance, Weight trends, Labs, I & O's  REASON FOR ASSESSMENT:   Malnutrition Screening Tool    ASSESSMENT:   70 year old female Patient was found in a pool of blood after falling and striking her head. She is amnestic for events of accident, but is now awake and alert and oriented.Patient also has mild compression fractures of C 7 and T 1, for which she should continue in a cervical collar. Hx of ETOH abuse. Head CT shows progression and enlargement of the left frontal lobe contusion. No significant shift, no operative indications at this time.   Diet has been advanced to a regular diet. No recent meal completion percentage recorded, however during time of visit po 40% on clear liquid diet this AM. Pt reports her appetite is fine currently and PTA with usual consumption of at least 2 meals a day at home with Ensure shakes 1-2 times daily. Per Epic weight records, pt with a 8% weight loss in 4 months. Pt unaware of weight lost. RD to order Ensure to aid in caloric and protein needs.   Nutrition-Focused physical exam completed. Findings are severe fat depletion, moderate to severe muscle depletion, and mild edema.   Labs and medications reviewed.   Diet Order:  Diet regular Room service appropriate?: Yes; Fluid consistency:: Thin  Skin:  Wound (see comment) (Stage I pressure ulcer on L buttocks and back, Stage II on R buttocks)  Last BM:  7/3  Height:   Ht Readings from Last  1 Encounters:  05/04/16 5\' 8"  (1.727 m)    Weight:   Wt Readings from Last 1 Encounters:  05/04/16 101 lb 13.6 oz (46.2 kg)    Ideal Body Weight:  63.6 kg  BMI:  Body mass index is 15.49 kg/(m^2).  Estimated Nutritional Needs:   Kcal:  1600-1800  Protein:  75-85 grams  Fluid:  1.6 - 1.8 L/day  EDUCATION NEEDS:   No education needs identified at this time  Roslyn SmilingStephanie Donne Baley, MS, RD, LDN Pager # [phone] After hours/ weekend pager # [phone]

## 2016-05-05 NOTE — Progress Notes (Signed)
PT Cancellation Note  Patient Details Name: Mackenzie SinghMartha F [name] MRN: [id number] DOB: [birth year]   Cancelled Treatment:    Reason Eval/Treat Not Completed: Medical issues which prohibited therapy. Spoke with RN. Results from this mornings CT indicate enlargement of the subarachnoid hemorrhage. Holding off on PT until bleed is stable. Will check back tomorrow.    Kallie LocksHannah Naeemah Jasmer, VirginiaPTA #[phone] 05/05/2016, 2:22 PM

## 2016-05-06 DIAGNOSIS — E43 Unspecified severe protein-calorie malnutrition: Secondary | ICD-10-CM | POA: Insufficient documentation

## 2016-05-06 MED ORDER — LABETALOL HCL 5 MG/ML IV SOLN
10.0000 mg | INTRAVENOUS | Status: DC | PRN
  Administered 2016-05-06 – 2016-05-07 (×2): 20 mg via INTRAVENOUS
  Filled 2016-05-06 (×2): qty 4

## 2016-05-06 MED ORDER — ONDANSETRON HCL 4 MG/2ML IJ SOLN: 4.0000 mg | Freq: Four times a day (QID) | INTRAMUSCULAR | Status: DC | PRN

## 2016-05-06 NOTE — Evaluation (Signed)
Physical Therapy Evaluation Patient Details Name: Mackenzie [name] MRN: [id number] DOB: [birth year] Today's Date: 05/06/2016   History of Present Illness  70 [facility]. [facility] s/p fall with resultant C7 and T1 fractures, and CT revealed L frontal lobe hemorrhage contusions, L frontal subdural hematoma, L posterior acute subdural hematoma, and R hygroma, trace left frontal subarachnoid hemorrhage.  Pt with significant PMHx of hip joint surgery.    Clinical Impression  Pt is very unsteady on her feet, reaching for stabilizing objects with both hands.  BPs were high (see vitals flow sheet), so we decided to not push physical mobility today.  Pt would likely be safer walking with a RW for balance at this time.  She reports her roommate can provide assist at discharge.   PT to follow acutely for deficits listed below.       Follow Up Recommendations Home health PT;Supervision/Assistance - 24 hour    Equipment Recommendations  None recommended by PT    Recommendations for Other Services   NA    Precautions / Restrictions Precautions Precautions: Fall      Mobility  Bed Mobility Overal bed mobility: Needs Assistance Bed Mobility: Supine to Sit     Supine to sit: Min guard     General bed mobility comments: Min guard assist for safety on compliant mattress  Transfers Overall transfer level: Needs assistance Equipment used: Straight cane Transfers: Sit to/from Stand Sit to Stand: +2 safety/equipment;Min assist         General transfer comment: Two person assist for safety, line management.  Pt holding to cane on right hand and bed with left hand.  Assist needed to support trunk for balance.   Ambulation/Gait Ambulation/Gait assistance: +2 physical assistance;Min assist Ambulation Distance (Feet): 5 Feet Assistive device: 1 person hand held assist;Straight cane Gait Pattern/deviations: Step-through pattern;Staggering left;Staggering right     General Gait  Details: Pt with heavy reliance on both upper extremities support for short distance gait from bed to chair.  Pt would not leg go of the bed as she started ambulating with the cane until she was offered a second hand for support on her left.           Balance Overall balance assessment: Needs assistance Sitting-balance support: Feet supported;Bilateral upper extremity supported Sitting balance-Leahy Scale: Fair     Standing balance support: Bilateral upper extremity supported Standing balance-Leahy Scale: Zero                               Pertinent Vitals/Pain Pain Assessment: 0-10 Pain Score: 7  Pain Location: head Pain Descriptors / Indicators: Aching Pain Intervention(s): Limited activity within patient's tolerance;Monitored during session;Repositioned    Home Living Family/patient expects to be discharged to:: Private residence Living Arrangements: Other (Comment);Non-relatives/Friends (friend, Dotty) Available Help at Discharge: Friend(s);Available 24 hours/day Type of Home: Apartment Home Access: Level entry     Home Layout: Two level;Bed/bath upstairs Home Equipment: Walker - 2 wheels;Bedside commode;Cane - single point      Prior Function Level of Independence: Needs assistance   Gait / Transfers Assistance Needed: per pt report she uses cane or a RW, not sure if she was using either when she fell.   ADL's / Homemaking Assistance Needed: independent with bathing and dressing.  Takes baths in the kitchen sink downstairs, uses adult diapers         Hand Dominance   Dominant Hand:  Right    Extremity/Trunk Assessment   Upper Extremity Assessment: Generalized weakness           Lower Extremity Assessment: Generalized weakness (pt very emaciated looking.  She is very light for her height)      Cervical / Trunk Assessment: Kyphotic  Communication   Communication: HOH  Cognition Arousal/Alertness: Awake/alert Behavior During Therapy: WFL  for tasks assessed/performed Overall Cognitive Status: Impaired/Different from baseline Area of Impairment: Problem solving             Problem Solving: Slow processing (~25 % of time vs HOH hard to tell)               Assessment/Plan    PT Assessment Patient needs continued PT services  PT Diagnosis Difficulty walking;Abnormality of gait;Generalized weakness;Acute pain;Altered mental status   PT Problem List Decreased strength;Decreased activity tolerance;Decreased balance;Decreased mobility;Decreased cognition;Decreased knowledge of use of DME;Decreased safety awareness;Cardiopulmonary status limiting activity;Pain  PT Treatment Interventions DME instruction;Gait training;Stair training;Functional mobility training;Therapeutic activities;Therapeutic exercise;Balance training;Neuromuscular re-education;Cognitive remediation;Patient/family education   PT Goals (Current goals can be found in the Care Plan section) Acute Rehab PT Goals Patient Stated Goal: to go home, decrase HA PT Goal Formulation: With patient Time For Goal Achievement: 05/20/16 Potential to Achieve Goals: Good    Frequency Min 4X/week           End of Session Equipment Utilized During Treatment: Gait belt;Cervical collar Activity Tolerance: Patient limited by pain;Treatment limited secondary to medical complications (Comment) (limited by high BPs RN made aware) Patient left: in chair;with call bell/phone within reach;with chair alarm set Nurse Communication: Mobility status;Other (comment) (high BPs)         Time: [id number] PT Time Calculation (min) (ACUTE ONLY): 32 min   Charges:   PT Evaluation $PT Eval Moderate Complexity: 1 Procedure PT Treatments $Therapeutic Activity: 8-22 mins        Layce Sprung B. Latacha Texeira, PT, DPT [phone]   05/06/2016, 6:08 PM

## 2016-05-06 NOTE — Progress Notes (Signed)
Subjective: Patient reports feeling OK.  Objective: Vital signs in last 24 hours: Temp:  [97.9 F (36.6 C)-99.1 F (37.3 C)] 98.4 F (36.9 C) (07/05 0726) Pulse Rate:  [74-101] 74 (07/05 0700) Resp:  [8-20] 8 (07/05 0700) BP: (116-177)/(64-101) 133/93 mmHg (07/05 0700) SpO2:  [92 %-98 %] 94 % (07/05 0700)  Intake/Output from previous day: 07/04 0701 - 07/05 0700 In: 2310 [P.O.:510; I.V.:1800] Out: -  Intake/Output this shift:    Physical Exam: Awake, alert, conversant.  C/o HA.  No drift.  Lab Results:  Recent Labs  05/04/16 1549  WBC 10.1  HGB 9.6*  HCT 28.2*  PLT 200   BMET  Recent Labs  05/04/16 1549 05/05/16 1008  NA 136 138  K 2.8* 3.5  CL 107 102  CO2 21* 28  GLUCOSE 86 160*  BUN <5* <5*  CREATININE 0.33* 0.53  CALCIUM 6.4* 8.3*    Studies/Results: Dg Lumbar Spine 2-3 Views  05/04/2016  CLINICAL DATA:  Fall with low back and sacral pain. EXAM: LUMBAR SPINE - 2-3 VIEW COMPARISON:  08/06/2008. FINDINGS: Bones are diffusely demineralized. There is mild loss of vertebral body height at L4, unchanged in the interval, consistent with superior endplate compression fracture. Anterior wedge compression deformity T12 was present previously but has progressed slightly in the interval. No evidence for an acute fracture. Intervertebral disc spaces are preserved. IMPRESSION: Old compression fractures at T12 and L4.  No acute bony abnormality. Electronically Signed   By: Kennith CenterEric  Mansell M.D.   On: 05/04/2016 17:01   Dg Pelvis 1-2 Views  05/04/2016  CLINICAL DATA:  Fall with pelvic pain.  Worse on the right. EXAM: PELVIS - 1-2 VIEW COMPARISON:  10/03/2008 FINDINGS: Osteopenia. Left hip arthroplasty. Sacroiliac joints are symmetric. Osseous irregularity involving the inferior right pubic ramus, likely related to remote trauma. IMPRESSION: Osteopenia. No convincing evidence of acute osseous abnormality. Subtle osseous irregularity involving the inferior right pubic ramus is  likely related to remote trauma, but appears new since 2009. Osteopenia. Left hip arthroplasty. Electronically Signed   By: Jeronimo GreavesKyle  Talbot M.D.   On: 05/04/2016 16:54   Ct Head Wo Contrast  05/05/2016  CLINICAL DATA:  Follow-up subarachnoid hemorrhage. EXAM: CT HEAD WITHOUT CONTRAST TECHNIQUE: Contiguous axial images were obtained from the base of the skull through the vertex without intravenous contrast. COMPARISON:  CT HEAD May 04, 2016 FINDINGS: INTRACRANIAL CONTENTS: Interval blossoming of LEFT frontal lobe hemorrhagic contusions, the largest measures 2.2 x 1.9 cm. Surrounding low-density vasogenic edema and local mass effect. Trace LEFT frontal subarachnoid hemorrhage. 2 mm LEFT-to-RIGHT midline shift. LEFT frontal acute subdural hematoma focally measures up to 7 mm. 3 mm enlarging LEFT posterior acute subdural hematoma. Enlarging RIGHT holo hemispheric low-density 3 mm probable hygroma. No hydrocephalus. No acute large vascular territory infarcts. Basal cistern patent. ORBITS:  Ocular globes and orbital contents are nonsuspicious. SINUSES: Scattered hemo sinus, most conspicuous in RIGHT sphenoid sinus. The mastoid air cells are well aerated. SKULL/SOFT TISSUES: No skull fracture. Small RIGHT parietal scalp hematoma. IMPRESSION: Interval blossoming LEFT frontal lobe hemorrhage contusions. 2 mm LEFT-to-RIGHT midline shift. Stable 7 mm LEFT frontal subdural hematoma. Increased 3 mm LEFT posterior acute subdural hematoma. Enlarging 3 mm RIGHT probable hygroma. Trace LEFT frontal subarachnoid hemorrhage. No skull fracture definitely identified, presence of paranasal hemo sinus concerning for occult fracture. Electronically Signed   By: Awilda Metroourtnay  Bloomer M.D.   On: 05/05/2016 06:22   Ct Head Wo Contrast  05/04/2016  CLINICAL DATA:  Found lying in  the parking lot with bleeding. EXAM: CT HEAD WITHOUT CONTRAST CT CERVICAL SPINE WITHOUT CONTRAST TECHNIQUE: Multidetector CT imaging of the head and cervical spine was  performed following the standard protocol without intravenous contrast. Multiplanar CT image reconstructions of the cervical spine were also generated. COMPARISON:  10/01/2008 FINDINGS: CT HEAD FINDINGS There are hemorrhagic contusions in the left frontal lobe, all less than 1 cm in size. There is a small amount of regional traumatic subarachnoid blood. There is an extra-axial hematoma along the greater wing of the sphenoid and left frontal bone, maximal thickness 8 mm. No significant mass effect upon the brain. No midline shift. There is a very small amount of posttraumatic subarachnoid blood overlying the right parietal region. Scalp swelling is present in that area. There is a nondisplaced fracture of the calvarium on the right at that site. There may be mild chronic small-vessel changes of the deep white matter. There is atherosclerotic calcification of the major vessels at the base of the brain. Blood layers in the right maxillary sinus. Blood fills the right division of the sphenoid sinus. Many of the ethmoid air cells are opacified. I do not see a skullbase fracture, though the presence of blood in the sphenoid sinus is worrisome for an occult skullbase fracture. CT CERVICAL SPINE FINDINGS There is curvature convex to the left. No antero or retrolisthesis. There is a minor superior endplate compression fracture of C7. I think there are minor fractures also of the T1 and the T2 vertebral bodies, neither with significant loss of height or any retropulsed bone. No posterior element fracture is seen. There is extensive bilateral carotid bifurcation atherosclerotic calcification. Benign appearing scarring is present at both lung apices. IMPRESSION: Close head injury with trauma to the right frontoparietal region with scalp swelling. Nondisplaced underlying skull fracture. Small amount of adjacent traumatic subarachnoid hemorrhage on the right. Multiple sub cm hemorrhagic contusions in the left frontal lobe. Left  frontal posttraumatic subarachnoid hemorrhage. Left-sided extra-axial collection, favored to represent a subdural hematoma, maximal thickness 8 mm is without significant mass effect. I do not see regional fracture to suggest that this could be epidural. Blood in the right maxillary and sphenoid sinuses. This raises the possibility of an occult skullbase fracture, though I do not identify that. Mild compression fractures at C7, T1 and T2 with loss of height of less than 10%. No retropulsed bone or canal compromise. Critical Value/emergent results were called by telephone at the time of interpretation on 05/04/2016 at 4:55 pm to Dr. Linwood DibblesJon Knapp , who verbally acknowledged these results. Electronically Signed   By: Paulina FusiMark  Shogry M.D.   On: 05/04/2016 16:55   Ct Cervical Spine Wo Contrast  05/04/2016  CLINICAL DATA:  Found lying in the parking lot with bleeding. EXAM: CT HEAD WITHOUT CONTRAST CT CERVICAL SPINE WITHOUT CONTRAST TECHNIQUE: Multidetector CT imaging of the head and cervical spine was performed following the standard protocol without intravenous contrast. Multiplanar CT image reconstructions of the cervical spine were also generated. COMPARISON:  10/01/2008 FINDINGS: CT HEAD FINDINGS There are hemorrhagic contusions in the left frontal lobe, all less than 1 cm in size. There is a small amount of regional traumatic subarachnoid blood. There is an extra-axial hematoma along the greater wing of the sphenoid and left frontal bone, maximal thickness 8 mm. No significant mass effect upon the brain. No midline shift. There is a very small amount of posttraumatic subarachnoid blood overlying the right parietal region. Scalp swelling is present in that area. There is  a nondisplaced fracture of the calvarium on the right at that site. There may be mild chronic small-vessel changes of the deep white matter. There is atherosclerotic calcification of the major vessels at the base of the brain. Blood layers in the right  maxillary sinus. Blood fills the right division of the sphenoid sinus. Many of the ethmoid air cells are opacified. I do not see a skullbase fracture, though the presence of blood in the sphenoid sinus is worrisome for an occult skullbase fracture. CT CERVICAL SPINE FINDINGS There is curvature convex to the left. No antero or retrolisthesis. There is a minor superior endplate compression fracture of C7. I think there are minor fractures also of the T1 and the T2 vertebral bodies, neither with significant loss of height or any retropulsed bone. No posterior element fracture is seen. There is extensive bilateral carotid bifurcation atherosclerotic calcification. Benign appearing scarring is present at both lung apices. IMPRESSION: Close head injury with trauma to the right frontoparietal region with scalp swelling. Nondisplaced underlying skull fracture. Small amount of adjacent traumatic subarachnoid hemorrhage on the right. Multiple sub cm hemorrhagic contusions in the left frontal lobe. Left frontal posttraumatic subarachnoid hemorrhage. Left-sided extra-axial collection, favored to represent a subdural hematoma, maximal thickness 8 mm is without significant mass effect. I do not see regional fracture to suggest that this could be epidural. Blood in the right maxillary and sphenoid sinuses. This raises the possibility of an occult skullbase fracture, though I do not identify that. Mild compression fractures at C7, T1 and T2 with loss of height of less than 10%. No retropulsed bone or canal compromise. Critical Value/emergent results were called by telephone at the time of interpretation on 05/04/2016 at 4:55 pm to Dr. Linwood Dibbles , who verbally acknowledged these results. Electronically Signed   By: Paulina Fusi M.D.   On: 05/04/2016 16:55    Assessment/Plan: Patient looks good, although CT shows extensive left frontal contusion.  Mobilize, PT, social work consults.    LOS: 2 days    Dorian Heckle,  MD 05/06/2016, 8:01 AM

## 2016-05-06 NOTE — Progress Notes (Signed)
SLP Cancellation Note  Patient Details Name: Dorris SinghMartha F Darty MRN: [id number] DOB: 09-10-1946   Cancelled treatment:       Reason Eval/Treat Not Completed: Patient at procedure or test/unavailable. Pt working with physical therapy. Will re-attempt as time allows.   [place]ro [place], Artie Mcintyre K, MA, CCC-SLP 05/06/2016, 12:15 PM [phone]

## 2016-05-07 NOTE — Care Management Important Message (Signed)
Important Message  Patient Details  Name: Mackenzie [name] MRN: [id number] Date of Birth: [birth year]   Medicare Important Message Given:  Yes    Bernadette HoitShoffner, Tramane Gorum Coleman 05/07/2016, 10:31 AM

## 2016-05-07 NOTE — Progress Notes (Signed)
Patient ID: Mackenzie SinghMartha F Kimbley, female   DOB: July 27, 1946, 70 y.o.   MRN: [id number]  Alert, reports persistent frontal headache. PEARL. No drift. Worked with PT this morning.  Per DrStern, will repeat head CT in the morning. Order entered.   Georgiann CockerBrian Samamtha Tiegs RN BSN

## 2016-05-08 ENCOUNTER — Inpatient Hospital Stay (HOSPITAL_COMMUNITY): Payer: Medicare Other

## 2016-05-08 NOTE — Progress Notes (Signed)
Subjective: Patient reports "My head doesn't hurt when I'm alseep"  Objective: Vital signs in last 24 hours: Temp:  [98.4 F (36.9 C)-100.7 F (38.2 C)] 98.5 F (36.9 C) (07/07 0400) Pulse Rate:  [70-100] 70 (07/07 0700) Resp:  [6-21] 13 (07/07 0700) BP: (124-184)/(68-106) 132/78 mmHg (07/07 0700) SpO2:  [93 %-100 %] 93 % (07/07 0700)  Intake/Output from previous day: 07/06 0701 - 07/07 0700 In: 450 [I.V.:450] Out: -  Intake/Output this shift:    Awakens to voice. PEARL. Reports mild persistent headache. MAEW. Repeat CT this morning evolving, less shift.   Lab Results: No results for input(s): WBC, HGB, HCT, PLT in the last 72 hours. BMET  Recent Labs  05/05/16 1008  NA 138  K 3.5  CL 102  CO2 28  GLUCOSE 160*  BUN <5*  CREATININE 0.53  CALCIUM 8.3*    Studies/Results: Ct Head Wo Contrast  05/08/2016  CLINICAL DATA:  Follow-up examination for acute subarachnoid hemorrhage. EXAM: CT HEAD WITHOUT CONTRAST TECHNIQUE: Contiguous axial images were obtained from the base of the skull through the vertex without intravenous contrast. COMPARISON:  Prior CT from 05/05/2016. FINDINGS: Hemorrhagic contusions at the anterior/ inferior left frontal lobe continue to evolve. Largest hemorrhage measures 2.3 x 1.9 cm, similar to previous. Small contusion noted at the left gyrus rectus as well. Surrounding vasogenic edema with localized mass effect has slightly worsened as compared to prior exam. Left-to-right shift now measures up to 2 mm. Increase partial effacement of the left lateral ventricle. No hydrocephalus or evidence of ventricular trapping. Basilar cisterns remain patent. Subdural hematoma overlying the left frontal lobe slightly decreased now measuring up to 5 mm at maximal thickness. Subdural blood overlying the left posterior convexity stable to slightly decreased measuring up to 2 mm. Blossoming parenchymal contusion at the posterior right frontal cortex measures 8 mm (series 3,  image 26). Probable small right holo hemispheric subdural hygroma noted, similar to prior. No acute large vessel territory infarct. Stable atrophy with chronic small vessel ischemic disease. Globes and orbits within normal limits. Posterior scalp contusion near the vertex, decreased from prior. Layering hemorrhage within the right sphenoid sinus. Mild opacity posterior left ethmoid air cells. Paranasal sinuses are otherwise clear. No mastoid effusion. There is a subtle right frontal parietal calvarial fracture (series 4, image 41). No displacement or depression. IMPRESSION: 1. Continued interval evolution of hemorrhagic left frontal lobe contusions with slightly increased localized edema and mass effect. Left-to-right shift now measures up to 4 mm (previously 2 mm). 2. Slight interval decrease in overall size of left subdural hematoma, most evident anteriorly. 3. Interval blossoming of 8 mm right frontal cortical contusion without mass effect. 4. Nondisplaced, nondepressed right frontoparietal calvarial fracture, stable from previous. 5. Persistent right sphenoid hemo sinus. Electronically Signed   By: Rise MuBenjamin  McClintock M.D.   On: 05/08/2016 06:07    Assessment/Plan: Improving slowly   LOS: 4 days  Per Dr. Venetia MaxonStern ok to transfer to floor. Continue to mobilize with PT   Georgiann Cockeroteat, Brian 05/08/2016, 7:39 AM    Head CT shows evolving hematoma without worsening shift.  Transfer to floor.

## 2016-05-08 NOTE — Progress Notes (Signed)
Pt social security and insurance card on bedside table. Pt advised about valuables policy and RN asked if she wanted to send downstairs to safe. Pt refused and stated that she wanted to keep cards on bedside table.

## 2016-05-08 NOTE — Progress Notes (Signed)
Patient arrived on unit and oriented to room. Patient appears to still be drowsy but following commands. Patient setup in chair with call button available.

## 2016-05-08 NOTE — Progress Notes (Signed)
Speech Language Pathology Treatment: Cognitive-Linquistic  Patient Details Name: Mackenzie SinghMartha F [name] MRN: [id number] DOB: [birth year] Today's Date: 05/08/2016 Time: 1430-1450 SLP Time Calculation (min) (ACUTE ONLY): 20 min  Assessment / Plan / Recommendation Clinical Impression  Diagnostic therapy with subtests of Cognistat. Severe impairment for 4 word recall marked by decreased storage of information. Verbal problem solving functional for 3/3 questions however suspect difficulty implementing during activities. Pt's responses are intermittently vague missing integral parts of information. Continue ST with focus on working and anticipatory memory.    HPI HPI: Pt is a 70 y.o. female presenting to ED on 7/3 after a possible syncopal episode and fall. Pt is amnestic for events of accident. Head CT showed L frontal lobe hemorrhage contusions, 2 mm L- to-R midline shift; stable L frontal subdural hematoma, Increased 3 mm LEFT posterior acute subdural hematoma. Enlarging 3 mm RIGHT probable hygroma. Trace LEFT frontal subarachnoid hemorrhage. Pt was inebriated at admission with opiates also present. Speech-language eval ordered.       SLP Plan  Continue with current plan of care     Recommendations                Oral Care Recommendations: Oral care BID Plan: Continue with current plan of care     GO                Royce MacadamiaLitaker, Marguriete Wootan Willis 05/08/2016, 3:19 PM  Breck CoonsLisa Willis Lonell FaceLitaker M.Ed [employer]-SLP Pager [phone]

## 2016-05-08 NOTE — Progress Notes (Signed)
Physical Therapy Treatment Patient Details Name: Mackenzie SinghMartha F Osborne MRN: [id number] DOB: 1946/06/08 Today's Date: 05/08/2016    History of Present Illness 70 y.o. female [facility] s/p fall with resultant C7 and T1 fractures, and CT revealed L frontal lobe hemorrhage contusions, L frontal subdural hematoma, L posterior acute subdural hematoma, and R hygroma, trace left frontal subarachnoid hemorrhage.  Pt with significant PMHx of hip joint surgery.      PT Comments    Pt found saturated in urine with reports of feeling cold.  Pt progressed gait training and able to perform 2 transfers during PT intervention.  Pt fatigued post session.    Follow Up Recommendations  Home health PT;Supervision/Assistance - 24 hour     Equipment Recommendations  None recommended by PT    Recommendations for Other Services       Precautions / Restrictions Precautions Precautions: Fall Restrictions Weight Bearing Restrictions: No    Mobility  Bed Mobility               General bed mobility comments: Pt received in recliner on arrival.    Transfers Overall transfer level: Needs assistance Equipment used: Straight cane Transfers: Sit to/from Stand Sit to Stand: Mod assist         General transfer comment: Pt required cues for hand placement, pushing, forward weight shifting and transition of hands from chair to RW.  Pt performed sit to stand from recliner and 3:1 commode.  posterior LOB.    Ambulation/Gait Ambulation/Gait assistance: Mod assist;Min assist Ambulation Distance (Feet): 64 Feet Assistive device: Rolling walker (2 wheeled) Gait Pattern/deviations: Step-through pattern;Staggering left;Staggering right;Leaning posteriorly;Decreased stride length;Trunk flexed     General Gait Details: Pt remains to present with flexed posture and required cues for upright trunk and neck position.  Pt fatigues quickly and required assist for RW position, assist with turns and  assist with backing.  Pt tolerated tx well.  Improved safety noted with RW.     Stairs            Wheelchair Mobility    Modified Rankin (Stroke Patients Only)       Balance Overall balance assessment: Needs assistance   Sitting balance-Leahy Scale: Fair       Standing balance-Leahy Scale: Poor                      Cognition Arousal/Alertness: Awake/alert Behavior During Therapy: WFL for tasks assessed/performed Overall Cognitive Status: Impaired/Different from baseline Area of Impairment: Problem solving             Problem Solving: Slow processing      Exercises      General Comments        Pertinent Vitals/Pain Pain Assessment: 0-10 Faces Pain Scale: Hurts even more Pain Location: neck and hips.   Pain Descriptors / Indicators: Grimacing;Guarding Pain Intervention(s): Monitored during session;Repositioned    Home Living                      Prior Function            PT Goals (current goals can now be found in the care plan section) Acute Rehab PT Goals Patient Stated Goal: to go home, decrase HA Potential to Achieve Goals: Good Progress towards PT goals: Progressing toward goals    Frequency  Min 4X/week    PT Plan Current plan remains appropriate    Co-evaluation  End of Session Equipment Utilized During Treatment: Gait belt;Cervical collar Activity Tolerance: Patient limited by pain;Treatment limited secondary to medical complications (Comment) Patient left: in chair;with call bell/phone within reach;with chair alarm set     Time: 1358-1423 PT Time Calculation (min) ([id number] ONLY): 25 min  Charges:  $Gait Training: 8-22 mins $Therapeutic Activity: 8-22 mins                    G Codes:      Florestine Aversimee J Tyronne Blann 05/08/2016, 3:10 PM  Joycelyn RuaAimee Romanita Fager, PTA pager [phone]

## 2016-05-09 ENCOUNTER — Inpatient Hospital Stay (HOSPITAL_COMMUNITY): Payer: Medicare Other

## 2016-05-09 LAB — URINE MICROSCOPIC-ADD ON: RBC / HPF: NONE SEEN RBC/hpf (ref 0–5)

## 2016-05-09 LAB — URINALYSIS, ROUTINE W REFLEX MICROSCOPIC
Bilirubin Urine: NEGATIVE
Glucose, UA: NEGATIVE mg/dL
HGB URINE DIPSTICK: NEGATIVE
Ketones, ur: 15 mg/dL — AB
Nitrite: NEGATIVE
PROTEIN: NEGATIVE mg/dL
Specific Gravity, Urine: 1.024 (ref 1.005–1.030)
pH: 8 (ref 5.0–8.0)

## 2016-05-09 NOTE — Progress Notes (Signed)
Patient ID: Dorris SinghMartha F [name], female   DOB: 1946-05-16, 70 y.o.   MRN: [id number] Sleepy, unable to feed herself, no weakness. Lives with a friend. Will get social worKER to see her re SNF

## 2016-05-09 NOTE — Progress Notes (Signed)
Patient sleepy threw out the day; voiding incontinent before she can make it to the bathroom; slow unsteady gait; reaching for furniture to walk; had to be instructed several times to stand up straight and use the walker to maintain balance; continues to be a high fall risk.

## 2016-05-09 NOTE — Progress Notes (Signed)
Reported fever to on call MD; medicated prn for elevated temp.; orders received for chest xray and urine; specimen sent to lab; await chest xray.

## 2016-05-10 NOTE — Care Management Note (Signed)
Case Management Note  Patient Details  Name: Mackenzie [name] MRN: [id number] Date of Birth: January 16, 1946  Subjective/Objective:                  closed head injury Action/Plan: Discharge planning  Expected Discharge Date:  05/10/16               Expected Discharge Plan:  [place]  In-House Referral:     Discharge planning Services  CM Consult  Post Acute Care Choice:    Choice offered to:  Patient  DME Arranged:  Walker rolling with seat DME Agency:  [place]:  PT [facility] Agency:  [place]  Status of Service:  Completed, signed off  If discussed at [place] of Stay Meetings, dates discussed:    Additional Comments: CM met with pt in room to offer choice of home health agency.  Pt chooses [facility] to render HHPT.  Referral called to [facility] rep, Tiffany.  CM called [facility] DME rep, Germaine to please deliver the rollator to room so pt can discharge.  No other CM needs were communicated. Dellie Catholic, RN 05/10/2016, 10:46 AM

## 2016-05-10 NOTE — Progress Notes (Signed)
PT Cancellation Note  Patient Details Name: Mackenzie SinghMartha F Hirata MRN: [id number] DOB: October 12, 1946   Cancelled Treatment:    Reason Eval/Treat Not Completed: Other (comment) (Pt refused OOB tx, reports she is getting up in room and will get up tomorrow.  )   Florestine AversAimee J Gomer France 05/10/2016, 5:08 PM Joycelyn RuaAimee Clarabel Marion, PTA pager [phone]

## 2016-05-10 NOTE — Progress Notes (Addendum)
Patient requesting pain medication for discharge; no narcotics prescribed for discharge today; patient states pain unmanaged for discharge; orders received to hold discharge until seen by Dr.Stern in am.

## 2016-05-10 NOTE — Discharge Summary (Signed)
Physician Discharge Summary  Patient ID: Mackenzie [name] MRN: [id number] DOB/AGE: 1946-06-04 70 y.o.  Admit date: 05/04/2016 Discharge date: 05/10/2016  Admission Diagnoses: Closed head injury   Discharge Diagnoses: Same   Discharged Condition: stable  Hospital Course: The patient was admitted on 05/04/2016 with a closed head injury. She was treated in the ICU for a couple of days and then transferred to the floor. Head CT was stable. The hospital course was routine. There were no complications. Pt had appropriate mild headache at times. No complaints  new N/T/W. The patient remained afebrile with stable vital signs, and tolerated a regular diet. The patient continued to increase activities with physical therapy, and pain was well controlled with oral pain medications.   Consults: None  Significant Diagnostic Studies:  Results for orders placed or performed during the hospital encounter of 05/04/16  MRSA PCR Screening  Result Value Ref Range   MRSA by PCR NEGATIVE NEGATIVE  CK  Result Value Ref Range   Total CK 36 (L) 38 - 234 U/L  Lactic acid, plasma  Result Value Ref Range   Lactic Acid, Venous 2.5 (HH) 0.5 - 1.9 mmol/L  Urinalysis, Routine w reflex microscopic (not at [facility])  Result Value Ref Range   Color, Urine YELLOW YELLOW   APPearance CLEAR CLEAR   Specific Gravity, Urine 1.007 1.005 - 1.030   pH 6.5 5.0 - 8.0   Glucose, UA NEGATIVE NEGATIVE mg/dL   Hgb urine dipstick NEGATIVE NEGATIVE   Bilirubin Urine NEGATIVE NEGATIVE   Ketones, ur NEGATIVE NEGATIVE mg/dL   Protein, ur NEGATIVE NEGATIVE mg/dL   Nitrite NEGATIVE NEGATIVE   Leukocytes, UA NEGATIVE NEGATIVE  Comprehensive metabolic panel  Result Value Ref Range   Sodium 136 135 - 145 mmol/L   Potassium 2.8 (L) 3.5 - 5.1 mmol/L   Chloride 107 101 - 111 mmol/L   CO2 21 (L) 22 - 32 mmol/L   Glucose, Bld 86 65 - 99 mg/dL   BUN <5 (L) 6 - 20 mg/dL   Creatinine, Ser 7.82 (L) 0.44 - 1.00 mg/dL   Calcium 6.4 (LL) 8.9 -  10.3 mg/dL   Total Protein 5.3 (L) 6.5 - 8.1 g/dL   Albumin 2.0 (L) 3.5 - 5.0 g/dL   AST 32 15 - 41 U/L   ALT 16 14 - 54 U/L   Alkaline Phosphatase 122 38 - 126 U/L   Total Bilirubin 0.4 0.3 - 1.2 mg/dL   GFR calc non Af Amer >60 >60 mL/min   GFR calc Af Amer >60 >60 mL/min   Anion gap 8 5 - 15  CBC WITH DIFFERENTIAL  Result Value Ref Range   WBC 10.1 4.0 - 10.5 K/uL   RBC 2.56 (L) 3.87 - 5.11 MIL/uL   Hemoglobin 9.6 (L) 12.0 - 15.0 g/dL   HCT 95.6 (L) 21.3 - 08.6 %   MCV 110.2 (H) 78.0 - 100.0 fL   MCH 37.5 (H) 26.0 - 34.0 pg   MCHC 34.0 30.0 - 36.0 g/dL   RDW 57.8 46.9 - 62.9 %   Platelets 200 150 - 400 K/uL   Neutrophils Relative % 71 %   Lymphocytes Relative 15 %   Monocytes Relative 13 %   Eosinophils Relative 1 %   Basophils Relative 0 %   Neutro Abs 7.2 1.7 - 7.7 K/uL   Lymphs Abs 1.5 0.7 - 4.0 K/uL   Monocytes Absolute 1.3 (H) 0.1 - 1.0 K/uL   Eosinophils Absolute 0.1 0.0 - 0.7 K/uL  Basophils Absolute 0.0 0.0 - 0.1 K/uL   Smear Review MORPHOLOGY UNREMARKABLE   Ethanol  Result Value Ref Range   Alcohol, Ethyl (B) 252 (H) <5 mg/dL  Urine rapid drug screen (hosp performed)not at [facility]  Result Value Ref Range   Opiates POSITIVE (A) NONE DETECTED   Cocaine NONE DETECTED NONE DETECTED   Benzodiazepines NONE DETECTED NONE DETECTED   Amphetamines NONE DETECTED NONE DETECTED   Tetrahydrocannabinol NONE DETECTED NONE DETECTED   Barbiturates NONE DETECTED NONE DETECTED  Basic metabolic panel  Result Value Ref Range   Sodium 138 135 - 145 mmol/L   Potassium 3.5 3.5 - 5.1 mmol/L   Chloride 102 101 - 111 mmol/L   CO2 28 22 - 32 mmol/L   Glucose, Bld 160 (H) 65 - 99 mg/dL   BUN <5 (L) 6 - 20 mg/dL   Creatinine, Ser 1.61 0.44 - 1.00 mg/dL   Calcium 8.3 (L) 8.9 - 10.3 mg/dL   GFR calc non Af Amer >60 >60 mL/min   GFR calc Af Amer >60 >60 mL/min   Anion gap 8 5 - 15  Urinalysis, Routine w reflex microscopic (not at [facility])  Result Value Ref Range   Color, Urine YELLOW  YELLOW   APPearance CLEAR CLEAR   Specific Gravity, Urine 1.024 1.005 - 1.030   pH 8.0 5.0 - 8.0   Glucose, UA NEGATIVE NEGATIVE mg/dL   Hgb urine dipstick NEGATIVE NEGATIVE   Bilirubin Urine NEGATIVE NEGATIVE   Ketones, ur 15 (A) NEGATIVE mg/dL   Protein, ur NEGATIVE NEGATIVE mg/dL   Nitrite NEGATIVE NEGATIVE   Leukocytes, UA SMALL (A) NEGATIVE  Urine microscopic-add on  Result Value Ref Range   Squamous Epithelial / LPF 6-30 (A) NONE SEEN   WBC, UA 6-30 0 - 5 WBC/hpf   RBC / HPF NONE SEEN 0 - 5 RBC/hpf   Bacteria, UA FEW (A) NONE SEEN   Urine-Other MUCOUS PRESENT   CBG monitoring, ED  Result Value Ref Range   Glucose-Capillary 81 65 - 99 mg/dL    Dg Lumbar Spine 2-3 Views  05/04/2016  CLINICAL DATA:  Fall with low back and sacral pain. EXAM: LUMBAR SPINE - 2-3 VIEW COMPARISON:  08/06/2008. FINDINGS: Bones are diffusely demineralized. There is mild loss of vertebral body height at L4, unchanged in the interval, consistent with superior endplate compression fracture. Anterior wedge compression deformity T12 was present previously but has progressed slightly in the interval. No evidence for an acute fracture. Intervertebral disc spaces are preserved. IMPRESSION: Old compression fractures at T12 and L4.  No acute bony abnormality. Electronically Signed   By: Kennith Center M.D.   On: 05/04/2016 17:01   Dg Pelvis 1-2 Views  05/04/2016  CLINICAL DATA:  Fall with pelvic pain.  Worse on the right. EXAM: PELVIS - 1-2 VIEW COMPARISON:  10/03/2008 FINDINGS: Osteopenia. Left hip arthroplasty. Sacroiliac joints are symmetric. Osseous irregularity involving the inferior right pubic ramus, likely related to remote trauma. IMPRESSION: Osteopenia. No convincing evidence of acute osseous abnormality. Subtle osseous irregularity involving the inferior right pubic ramus is likely related to remote trauma, but appears new since 2009. Osteopenia. Left hip arthroplasty. Electronically Signed   By: Jeronimo Greaves  M.D.   On: 05/04/2016 16:54   Ct Head Wo Contrast  05/08/2016  CLINICAL DATA:  Follow-up examination for acute subarachnoid hemorrhage. EXAM: CT HEAD WITHOUT CONTRAST TECHNIQUE: Contiguous axial images were obtained from the base of the skull through the vertex without intravenous contrast. COMPARISON:  Prior CT from 05/05/2016. FINDINGS: Hemorrhagic contusions at the anterior/ inferior left frontal lobe continue to evolve. Largest hemorrhage measures 2.3 x 1.9 cm, similar to previous. Small contusion noted at the left gyrus rectus as well. Surrounding vasogenic edema with localized mass effect has slightly worsened as compared to prior exam. Left-to-right shift now measures up to 2 mm. Increase partial effacement of the left lateral ventricle. No hydrocephalus or evidence of ventricular trapping. Basilar cisterns remain patent. Subdural hematoma overlying the left frontal lobe slightly decreased now measuring up to 5 mm at maximal thickness. Subdural blood overlying the left posterior convexity stable to slightly decreased measuring up to 2 mm. Blossoming parenchymal contusion at the posterior right frontal cortex measures 8 mm (series 3, image 26). Probable small right holo hemispheric subdural hygroma noted, similar to prior. No acute large vessel territory infarct. Stable atrophy with chronic small vessel ischemic disease. Globes and orbits within normal limits. Posterior scalp contusion near the vertex, decreased from prior. Layering hemorrhage within the right sphenoid sinus. Mild opacity posterior left ethmoid air cells. Paranasal sinuses are otherwise clear. No mastoid effusion. There is a subtle right frontal parietal calvarial fracture (series 4, image 41). No displacement or depression. IMPRESSION: 1. Continued interval evolution of hemorrhagic left frontal lobe contusions with slightly increased localized edema and mass effect. Left-to-right shift now measures up to 4 mm (previously 2 mm). 2. Slight  interval decrease in overall size of left subdural hematoma, most evident anteriorly. 3. Interval blossoming of 8 mm right frontal cortical contusion without mass effect. 4. Nondisplaced, nondepressed right frontoparietal calvarial fracture, stable from previous. 5. Persistent right sphenoid hemo sinus. Electronically Signed   By: Rise Mu M.D.   On: 05/08/2016 06:07   Ct Head Wo Contrast  05/05/2016  CLINICAL DATA:  Follow-up subarachnoid hemorrhage. EXAM: CT HEAD WITHOUT CONTRAST TECHNIQUE: Contiguous axial images were obtained from the base of the skull through the vertex without intravenous contrast. COMPARISON:  CT HEAD May 04, 2016 FINDINGS: INTRACRANIAL CONTENTS: Interval blossoming of LEFT frontal lobe hemorrhagic contusions, the largest measures 2.2 x 1.9 cm. Surrounding low-density vasogenic edema and local mass effect. Trace LEFT frontal subarachnoid hemorrhage. 2 mm LEFT-to-RIGHT midline shift. LEFT frontal acute subdural hematoma focally measures up to 7 mm. 3 mm enlarging LEFT posterior acute subdural hematoma. Enlarging RIGHT holo hemispheric low-density 3 mm probable hygroma. No hydrocephalus. No acute large vascular territory infarcts. Basal cistern patent. ORBITS:  Ocular globes and orbital contents are nonsuspicious. SINUSES: Scattered hemo sinus, most conspicuous in RIGHT sphenoid sinus. The mastoid air cells are well aerated. SKULL/SOFT TISSUES: No skull fracture. Small RIGHT parietal scalp hematoma. IMPRESSION: Interval blossoming LEFT frontal lobe hemorrhage contusions. 2 mm LEFT-to-RIGHT midline shift. Stable 7 mm LEFT frontal subdural hematoma. Increased 3 mm LEFT posterior acute subdural hematoma. Enlarging 3 mm RIGHT probable hygroma. Trace LEFT frontal subarachnoid hemorrhage. No skull fracture definitely identified, presence of paranasal hemo sinus concerning for occult fracture. Electronically Signed   By: Awilda Metro M.D.   On: 05/05/2016 06:22   Ct Head Wo  Contrast  05/04/2016  CLINICAL DATA:  Found lying in the parking lot with bleeding. EXAM: CT HEAD WITHOUT CONTRAST CT CERVICAL SPINE WITHOUT CONTRAST TECHNIQUE: Multidetector CT imaging of the head and cervical spine was performed following the standard protocol without intravenous contrast. Multiplanar CT image reconstructions of the cervical spine were also generated. COMPARISON:  10/01/2008 FINDINGS: CT HEAD FINDINGS There are hemorrhagic contusions in the left frontal lobe, all less than 1  cm in size. There is a small amount of regional traumatic subarachnoid blood. There is an extra-axial hematoma along the greater wing of the sphenoid and left frontal bone, maximal thickness 8 mm. No significant mass effect upon the brain. No midline shift. There is a very small amount of posttraumatic subarachnoid blood overlying the right parietal region. Scalp swelling is present in that area. There is a nondisplaced fracture of the calvarium on the right at that site. There may be mild chronic small-vessel changes of the deep white matter. There is atherosclerotic calcification of the major vessels at the base of the brain. Blood layers in the right maxillary sinus. Blood fills the right division of the sphenoid sinus. Many of the ethmoid air cells are opacified. I do not see a skullbase fracture, though the presence of blood in the sphenoid sinus is worrisome for an occult skullbase fracture. CT CERVICAL SPINE FINDINGS There is curvature convex to the left. No antero or retrolisthesis. There is a minor superior endplate compression fracture of C7. I think there are minor fractures also of the T1 and the T2 vertebral bodies, neither with significant loss of height or any retropulsed bone. No posterior element fracture is seen. There is extensive bilateral carotid bifurcation atherosclerotic calcification. Benign appearing scarring is present at both lung apices. IMPRESSION: Close head injury with trauma to the right  frontoparietal region with scalp swelling. Nondisplaced underlying skull fracture. Small amount of adjacent traumatic subarachnoid hemorrhage on the right. Multiple sub cm hemorrhagic contusions in the left frontal lobe. Left frontal posttraumatic subarachnoid hemorrhage. Left-sided extra-axial collection, favored to represent a subdural hematoma, maximal thickness 8 mm is without significant mass effect. I do not see regional fracture to suggest that this could be epidural. Blood in the right maxillary and sphenoid sinuses. This raises the possibility of an occult skullbase fracture, though I do not identify that. Mild compression fractures at C7, T1 and T2 with loss of height of less than 10%. No retropulsed bone or canal compromise. Critical Value/emergent results were called by telephone at the time of interpretation on 05/04/2016 at 4:55 pm to Dr. Linwood Dibbles , who verbally acknowledged these results. Electronically Signed   By: Paulina Fusi M.D.   On: 05/04/2016 16:55   Ct Cervical Spine Wo Contrast  05/04/2016  CLINICAL DATA:  Found lying in the parking lot with bleeding. EXAM: CT HEAD WITHOUT CONTRAST CT CERVICAL SPINE WITHOUT CONTRAST TECHNIQUE: Multidetector CT imaging of the head and cervical spine was performed following the standard protocol without intravenous contrast. Multiplanar CT image reconstructions of the cervical spine were also generated. COMPARISON:  10/01/2008 FINDINGS: CT HEAD FINDINGS There are hemorrhagic contusions in the left frontal lobe, all less than 1 cm in size. There is a small amount of regional traumatic subarachnoid blood. There is an extra-axial hematoma along the greater wing of the sphenoid and left frontal bone, maximal thickness 8 mm. No significant mass effect upon the brain. No midline shift. There is a very small amount of posttraumatic subarachnoid blood overlying the right parietal region. Scalp swelling is present in that area. There is a nondisplaced fracture of the  calvarium on the right at that site. There may be mild chronic small-vessel changes of the deep white matter. There is atherosclerotic calcification of the major vessels at the base of the brain. Blood layers in the right maxillary sinus. Blood fills the right division of the sphenoid sinus. Many of the ethmoid air cells are opacified. I do not  see a skullbase fracture, though the presence of blood in the sphenoid sinus is worrisome for an occult skullbase fracture. CT CERVICAL SPINE FINDINGS There is curvature convex to the left. No antero or retrolisthesis. There is a minor superior endplate compression fracture of C7. I think there are minor fractures also of the T1 and the T2 vertebral bodies, neither with significant loss of height or any retropulsed bone. No posterior element fracture is seen. There is extensive bilateral carotid bifurcation atherosclerotic calcification. Benign appearing scarring is present at both lung apices. IMPRESSION: Close head injury with trauma to the right frontoparietal region with scalp swelling. Nondisplaced underlying skull fracture. Small amount of adjacent traumatic subarachnoid hemorrhage on the right. Multiple sub cm hemorrhagic contusions in the left frontal lobe. Left frontal posttraumatic subarachnoid hemorrhage. Left-sided extra-axial collection, favored to represent a subdural hematoma, maximal thickness 8 mm is without significant mass effect. I do not see regional fracture to suggest that this could be epidural. Blood in the right maxillary and sphenoid sinuses. This raises the possibility of an occult skullbase fracture, though I do not identify that. Mild compression fractures at C7, T1 and T2 with loss of height of less than 10%. No retropulsed bone or canal compromise. Critical Value/emergent results were called by telephone at the time of interpretation on 05/04/2016 at 4:55 pm to Dr. Linwood DibblesJon Knapp , who verbally acknowledged these results. Electronically Signed   By:  Paulina FusiMark  Shogry M.D.   On: 05/04/2016 16:55   Dg Chest Port 1 View  05/09/2016  CLINICAL DATA:  Fever today. EXAM: PORTABLE CHEST 1 VIEW COMPARISON:  Frontal and lateral views 01/10/2010 FINDINGS: Cervical collar obscures the lung apices. The lungs remain hyperinflated and bronchial thickening. No focal airspace disease. The heart size and mediastinal contours are normal. No evidence of pleural effusion. No pneumothorax. No acute rib fracture is seen. IMPRESSION: Chronic hyperinflation and bronchial thickening. No acute abnormality or focal airspace disease. Electronically Signed   By: Rubye OaksMelanie  Ehinger M.D.   On: 05/09/2016 19:17    Antibiotics:  Anti-infectives    None      Discharge Exam: Blood pressure 131/96, pulse 84, temperature 98.8 F (37.1 C), temperature source Oral, resp. rate 14, height 5\' 8"  (1.727 m), weight 46.2 kg (101 lb 13.6 oz), SpO2 100 %. Neurologic: Grossly normal   Discharge Medications:     Medication List    STOP taking these medications        folic acid 1 MG tablet  Commonly known as:  FOLVITE     oxyCODONE-acetaminophen 5-325 MG tablet  Commonly known as:  PERCOCET/ROXICET     thiamine 100 MG tablet     traZODone 50 MG tablet  Commonly known as:  DESYREL        Disposition: Home   Final Dx: Closed head injury      Discharge Instructions    Call MD for:  difficulty breathing, headache or visual disturbances    Complete by:  As directed      Call MD for:  persistant nausea and vomiting    Complete by:  As directed      Call MD for:  severe uncontrolled pain    Complete by:  As directed      Call MD for:  temperature >100.4    Complete by:  As directed      Diet - low sodium heart healthy    Complete by:  As directed      Discharge instructions  Complete by:  As directed   No driving, no strenuous activity     Increase activity slowly    Complete by:  As directed               Signed: Rajvi Armentor S 05/10/2016, 8:46 AM

## 2016-05-11 LAB — URINE CULTURE: Culture: >=100000 — AB

## 2016-05-11 NOTE — Progress Notes (Signed)
Patient ID: Mackenzie SinghMartha F [name], female   DOB: Apr 11, 1946, 70 y.o.   MRN: [id number] Alert, conversant, MAEW. PEARL. Pt reporting persistent neck bilat hip, and head pain and verbalizes frustration with lack of prescription pain med for home use. Intact neurologically, with good strength all extremities. Mobilizing with cane - often without calling for assistance last two days.  Vista Collar in use.  Discussed at length with pt that she must not consume alcohol in her recovery time period; certainly not with pain medications. She verbally acknowledges and agrees.  Dr. Newell CoralNudelman agreed to write Rx's for Percocet 5/325 1-2 po q4-6hrs prn #40, to transition to Tramadol 50mg  1 po up to QID prn # 50.  She should have f/u Head CT w/o contrast and return to office in 2 weeks  with lateral C-spine x-ray.  Pt acknowledges and agrees to plan.   Mackenzie CockerBrian Anthon Harpole RN BSN

## 2016-05-11 NOTE — Care Management Important Message (Signed)
Important Message  Patient Details  Name: Mackenzie [name] MRN: [id number] Date of Birth: [birth year]   Medicare Important Message Given:  Yes    Mackenzie [name], Mackenzie [name] 05/11/2016, 9:40 AM

## 2016-05-11 NOTE — Progress Notes (Signed)
Pt for discharge home today. Discharge orders received. IV  dcd with dressing clean dry. Discharge instructions and prescriptions given with verbalized understanding. Contacted cab to take patient [facility] on [address] Staff brought patient to lobby via wheelchair at 1040 and patient was assisted into cab.

## 2016-05-13 ENCOUNTER — Inpatient Hospital Stay (HOSPITAL_COMMUNITY)
Admission: EM | Admit: 2016-05-13 | Discharge: 2016-05-17 | DRG: 469 | Disposition: A | Discharge status: Home Health | Payer: Medicare Other | Attending: Internal Medicine | Admitting: Internal Medicine

## 2016-05-13 ENCOUNTER — Encounter (HOSPITAL_COMMUNITY): Payer: Self-pay | Admitting: Emergency Medicine

## 2016-05-13 ENCOUNTER — Emergency Department (HOSPITAL_COMMUNITY): Payer: Medicare Other

## 2016-05-13 DIAGNOSIS — Z9181 History of falling: Secondary | ICD-10-CM

## 2016-05-13 DIAGNOSIS — T148 Other injury of unspecified body region: Secondary | ICD-10-CM | POA: Diagnosis not present

## 2016-05-13 DIAGNOSIS — D539 Nutritional anemia, unspecified: Secondary | ICD-10-CM

## 2016-05-13 DIAGNOSIS — R64 Cachexia: Secondary | ICD-10-CM | POA: Diagnosis not present

## 2016-05-13 DIAGNOSIS — S066X9D Traumatic subarachnoid hemorrhage with loss of consciousness of unspecified duration, subsequent encounter: Secondary | ICD-10-CM

## 2016-05-13 DIAGNOSIS — E43 Unspecified severe protein-calorie malnutrition: Secondary | ICD-10-CM | POA: Diagnosis present

## 2016-05-13 DIAGNOSIS — I609 Nontraumatic subarachnoid hemorrhage, unspecified: Secondary | ICD-10-CM

## 2016-05-13 DIAGNOSIS — M1990 Unspecified osteoarthritis, unspecified site: Secondary | ICD-10-CM | POA: Diagnosis not present

## 2016-05-13 DIAGNOSIS — R5381 Other malaise: Secondary | ICD-10-CM | POA: Diagnosis not present

## 2016-05-13 DIAGNOSIS — E871 Hypo-osmolality and hyponatremia: Secondary | ICD-10-CM | POA: Diagnosis not present

## 2016-05-13 DIAGNOSIS — S72001A Fracture of unspecified part of neck of right femur, initial encounter for closed fracture: Secondary | ICD-10-CM

## 2016-05-13 DIAGNOSIS — R739 Hyperglycemia, unspecified: Secondary | ICD-10-CM | POA: Diagnosis not present

## 2016-05-13 DIAGNOSIS — D62 Acute posthemorrhagic anemia: Secondary | ICD-10-CM | POA: Diagnosis not present

## 2016-05-13 DIAGNOSIS — W07XXXA Fall from chair, initial encounter: Secondary | ICD-10-CM | POA: Diagnosis not present

## 2016-05-13 DIAGNOSIS — E559 Vitamin D deficiency, unspecified: Secondary | ICD-10-CM | POA: Diagnosis not present

## 2016-05-13 DIAGNOSIS — G4700 Insomnia, unspecified: Secondary | ICD-10-CM | POA: Diagnosis present

## 2016-05-13 DIAGNOSIS — I613 Nontraumatic intracerebral hemorrhage in brain stem: Secondary | ICD-10-CM | POA: Diagnosis not present

## 2016-05-13 DIAGNOSIS — Z87891 Personal history of nicotine dependence: Secondary | ICD-10-CM | POA: Diagnosis not present

## 2016-05-13 DIAGNOSIS — D638 Anemia in other chronic diseases classified elsewhere: Secondary | ICD-10-CM | POA: Diagnosis present

## 2016-05-13 DIAGNOSIS — B9620 Unspecified Escherichia coli [E. coli] as the cause of diseases classified elsewhere: Secondary | ICD-10-CM | POA: Diagnosis present

## 2016-05-13 DIAGNOSIS — N390 Urinary tract infection, site not specified: Secondary | ICD-10-CM | POA: Diagnosis present

## 2016-05-13 DIAGNOSIS — Z419 Encounter for procedure for purposes other than remedying health state, unspecified: Secondary | ICD-10-CM

## 2016-05-13 DIAGNOSIS — S72009A Fracture of unspecified part of neck of unspecified femur, initial encounter for closed fracture: Secondary | ICD-10-CM | POA: Diagnosis present

## 2016-05-13 DIAGNOSIS — Z471 Aftercare following joint replacement surgery: Secondary | ICD-10-CM | POA: Diagnosis not present

## 2016-05-13 DIAGNOSIS — S72091A Other fracture of head and neck of right femur, initial encounter for closed fracture: Secondary | ICD-10-CM | POA: Diagnosis not present

## 2016-05-13 DIAGNOSIS — W19XXXA Unspecified fall, initial encounter: Secondary | ICD-10-CM | POA: Diagnosis present

## 2016-05-13 DIAGNOSIS — Z79899 Other long term (current) drug therapy: Secondary | ICD-10-CM

## 2016-05-13 DIAGNOSIS — F329 Major depressive disorder, single episode, unspecified: Secondary | ICD-10-CM | POA: Diagnosis present

## 2016-05-13 DIAGNOSIS — S72001D Fracture of unspecified part of neck of right femur, subsequent encounter for closed fracture with routine healing: Secondary | ICD-10-CM | POA: Diagnosis not present

## 2016-05-13 DIAGNOSIS — S72011A Unspecified intracapsular fracture of right femur, initial encounter for closed fracture: Secondary | ICD-10-CM | POA: Diagnosis not present

## 2016-05-13 DIAGNOSIS — E876 Hypokalemia: Secondary | ICD-10-CM

## 2016-05-13 DIAGNOSIS — Z96641 Presence of right artificial hip joint: Secondary | ICD-10-CM | POA: Diagnosis not present

## 2016-05-13 LAB — TYPE AND SCREEN
ABO/RH(D): A POS
ANTIBODY SCREEN: NEGATIVE

## 2016-05-13 LAB — BASIC METABOLIC PANEL
ANION GAP: 5 (ref 5–15)
Anion gap: 7 (ref 5–15)
BUN: 8 mg/dL (ref 6–20)
BUN: 6 mg/dL (ref 6–20)
CHLORIDE: 100 mmol/L — AB (ref 101–111)
CO2: 33 mmol/L — ABNORMAL HIGH (ref 22–32)
CO2: 26 mmol/L (ref 22–32)
CREATININE: 0.42 mg/dL — AB (ref 0.44–1.00)
CREATININE: 0.37 mg/dL — AB (ref 0.44–1.00)
Calcium: 8.2 mg/dL — ABNORMAL LOW (ref 8.9–10.3)
Calcium: 7.5 mg/dL — ABNORMAL LOW (ref 8.9–10.3)
Chloride: 89 mmol/L — ABNORMAL LOW (ref 101–111)
GFR calc Af Amer: >60 mL/min (ref >60)
GFR calc non Af Amer: >60 mL/min (ref >60)
Glucose, Bld: 121 mg/dL — ABNORMAL HIGH (ref 65–99)
Glucose, Bld: 80 mg/dL (ref 65–99)
POTASSIUM: 2.8 mmol/L — AB (ref 3.5–5.1)
POTASSIUM: 3.1 mmol/L — AB (ref 3.5–5.1)
SODIUM: 129 mmol/L — AB (ref 135–145)
SODIUM: 131 mmol/L — AB (ref 135–145)

## 2016-05-13 LAB — IRON AND TIBC
Iron: 36 ug/dL (ref 28–170)
SATURATION RATIOS: 23 % (ref 10.4–31.8)
TIBC: 157 ug/dL — AB (ref 250–450)
UIBC: 121 ug/dL

## 2016-05-13 LAB — RETICULOCYTES
RBC.: 2.64 MIL/uL — AB (ref 3.87–5.11)
Retic Count, Absolute: 44.9 10*3/uL (ref 19.0–186.0)
Retic Ct Pct: 1.7 % (ref 0.4–3.1)

## 2016-05-13 LAB — ABO/RH: ABO/RH(D): A POS

## 2016-05-13 LAB — CBC WITH DIFFERENTIAL/PLATELET
BASOS ABS: 0 10*3/uL (ref 0.0–0.1)
BASOS PCT: 0 %
EOS ABS: 0 10*3/uL (ref 0.0–0.7)
EOS PCT: 0 %
HCT: 29.8 % — ABNORMAL LOW (ref 36.0–46.0)
Hemoglobin: 10.2 g/dL — ABNORMAL LOW (ref 12.0–15.0)
LYMPHS PCT: 6 %
Lymphs Abs: 0.6 10*3/uL — ABNORMAL LOW (ref 0.7–4.0)
MCH: 36.6 pg — ABNORMAL HIGH (ref 26.0–34.0)
MCHC: 34.2 g/dL (ref 30.0–36.0)
MCV: 106.8 fL — ABNORMAL HIGH (ref 78.0–100.0)
Monocytes Absolute: 1 10*3/uL (ref 0.1–1.0)
Monocytes Relative: 10 %
Neutro Abs: 8.2 10*3/uL — ABNORMAL HIGH (ref 1.7–7.7)
Neutrophils Relative %: 84 %
PLATELETS: 330 10*3/uL (ref 150–400)
RBC: 2.79 MIL/uL — AB (ref 3.87–5.11)
RDW: 12.4 % (ref 11.5–15.5)
WBC: 9.8 10*3/uL (ref 4.0–10.5)

## 2016-05-13 LAB — PROTIME-INR
INR: 1.2 (ref 0.00–1.49)
PROTHROMBIN TIME: 15.4 s — AB (ref 11.6–15.2)

## 2016-05-13 LAB — FERRITIN: Ferritin: 596 ng/mL — ABNORMAL HIGH (ref 11–307)

## 2016-05-13 LAB — OSMOLALITY: Osmolality: 273 mOsm/kg — ABNORMAL LOW (ref 275–295)

## 2016-05-13 LAB — FOLATE: FOLATE: 10.8 ng/mL (ref >5.9)

## 2016-05-13 LAB — URINALYSIS, ROUTINE W REFLEX MICROSCOPIC
Bilirubin Urine: NEGATIVE
Glucose, UA: NEGATIVE mg/dL
HGB URINE DIPSTICK: NEGATIVE
Ketones, ur: NEGATIVE mg/dL
NITRITE: NEGATIVE
PROTEIN: NEGATIVE mg/dL
Specific Gravity, Urine: 1.015 (ref 1.005–1.030)
pH: 6 (ref 5.0–8.0)

## 2016-05-13 LAB — URINE MICROSCOPIC-ADD ON

## 2016-05-13 LAB — SODIUM, URINE, RANDOM: Sodium, Ur: 11 mmol/L

## 2016-05-13 LAB — PHOSPHORUS: Phosphorus: 2.6 mg/dL (ref 2.5–4.6)

## 2016-05-13 LAB — OSMOLALITY, URINE: OSMOLALITY UR: 278 mosm/kg — AB (ref 300–900)

## 2016-05-13 LAB — VITAMIN B12: Vitamin B-12: 1223 pg/mL — ABNORMAL HIGH (ref 180–914)

## 2016-05-13 LAB — MAGNESIUM: Magnesium: 1.7 mg/dL (ref 1.7–2.4)

## 2016-05-13 LAB — PREALBUMIN: Prealbumin: 5 mg/dL — ABNORMAL LOW (ref 18–38)

## 2016-05-13 MED ORDER — KETOROLAC TROMETHAMINE 15 MG/ML IJ SOLN
15.0000 mg | Freq: Three times a day (TID) | INTRAMUSCULAR | Status: DC | PRN
  Administered 2016-05-14 – 2016-05-16 (×5): 15 mg via INTRAVENOUS
  Filled 2016-05-13 (×5): qty 1

## 2016-05-13 MED ORDER — SODIUM CHLORIDE 0.9 % IV SOLN
INTRAVENOUS | Status: DC
  Administered 2016-05-13 – 2016-05-15 (×4): via INTRAVENOUS

## 2016-05-13 MED ORDER — FENTANYL 12 MCG/HR TD PT72
12.5000 ug | MEDICATED_PATCH | TRANSDERMAL | Status: DC
  Administered 2016-05-13 – 2016-05-15 (×2): 12.5 ug via TRANSDERMAL
  Filled 2016-05-13 (×2): qty 1

## 2016-05-13 MED ORDER — TRAZODONE HCL 50 MG PO TABS: 25.0000 mg | ORAL_TABLET | Freq: Every evening | ORAL | Status: DC | PRN

## 2016-05-13 MED ORDER — MAGNESIUM HYDROXIDE 400 MG/5ML PO SUSP: 30.0000 mL | Freq: Every day | ORAL | Status: DC | PRN

## 2016-05-13 MED ORDER — SODIUM CHLORIDE 0.9 % IV SOLN
INTRAVENOUS | Status: DC
  Administered 2016-05-13: 09:00:00 via INTRAVENOUS

## 2016-05-13 MED ORDER — POTASSIUM CHLORIDE 10 MEQ/100ML IV SOLN
10.0000 meq | INTRAVENOUS | Status: AC
  Administered 2016-05-13 (×4): 10 meq via INTRAVENOUS
  Filled 2016-05-13 (×4): qty 100

## 2016-05-13 MED ORDER — OXYCODONE-ACETAMINOPHEN 5-325 MG PO TABS
1.0000 | ORAL_TABLET | ORAL | Status: DC | PRN
  Administered 2016-05-13: 1 via ORAL
  Administered 2016-05-13: 2 via ORAL
  Administered 2016-05-13: 1 via ORAL
  Administered 2016-05-14 (×3): 2 via ORAL
  Filled 2016-05-13: qty 1
  Filled 2016-05-13 (×4): qty 2
  Filled 2016-05-13: qty 1

## 2016-05-13 MED ORDER — ONDANSETRON HCL 4 MG/2ML IJ SOLN
4.0000 mg | Freq: Four times a day (QID) | INTRAMUSCULAR | Status: DC | PRN
  Administered 2016-05-13: 4 mg via INTRAVENOUS
  Filled 2016-05-13: qty 2

## 2016-05-13 MED ORDER — MORPHINE SULFATE (PF) 4 MG/ML IV SOLN
4.0000 mg | INTRAVENOUS | Status: DC | PRN
  Administered 2016-05-13 – 2016-05-14 (×5): 4 mg via INTRAVENOUS
  Filled 2016-05-13 (×5): qty 1

## 2016-05-13 MED ORDER — DICLOFENAC SODIUM 1 % TD GEL
4.0000 g | Freq: Four times a day (QID) | TRANSDERMAL | Status: DC | PRN
  Filled 2016-05-13: qty 100

## 2016-05-13 MED ORDER — POTASSIUM CHLORIDE 10 MEQ/100ML IV SOLN
10.0000 meq | INTRAVENOUS | Status: AC
  Administered 2016-05-13 – 2016-05-14 (×2): 10 meq via INTRAVENOUS
  Filled 2016-05-13 (×3): qty 100

## 2016-05-13 MED ORDER — ACETAMINOPHEN 650 MG RE SUPP: 650.0000 mg | Freq: Four times a day (QID) | RECTAL | Status: DC | PRN

## 2016-05-13 MED ORDER — SODIUM CHLORIDE 0.9 % IV BOLUS (SEPSIS)
1000.0000 mL | Freq: Once | INTRAVENOUS | Status: AC
  Administered 2016-05-13: 1000 mL via INTRAVENOUS

## 2016-05-13 MED ORDER — SODIUM CHLORIDE 0.9 % IV SOLN: INTRAVENOUS | Status: DC

## 2016-05-13 MED ORDER — FENTANYL CITRATE (PF) 100 MCG/2ML IJ SOLN
50.0000 ug | INTRAMUSCULAR | Status: AC | PRN
  Administered 2016-05-13 (×2): 50 ug via INTRAVENOUS
  Filled 2016-05-13 (×2): qty 2

## 2016-05-13 MED ORDER — ONDANSETRON HCL 4 MG PO TABS: 4.0000 mg | ORAL_TABLET | Freq: Four times a day (QID) | ORAL | Status: DC | PRN

## 2016-05-13 MED ORDER — ACETAMINOPHEN 325 MG PO TABS: 650.0000 mg | ORAL_TABLET | Freq: Four times a day (QID) | ORAL | Status: DC | PRN

## 2016-05-13 NOTE — ED Notes (Signed)
Pt is requesting meds for pain. Pharmacy called and asked to verify medications in order to be able to pull from pyxis.

## 2016-05-13 NOTE — ED Notes (Signed)
Back from xray, Cherrie GauzeZoey PA student aware of all abnormal labs

## 2016-05-13 NOTE — H&P (Addendum)
History and Physical    Mackenzie [name] ONG:[id number] DOB: 05-02-1946 DOA: 05/13/2016  PCP: Leanor RubensteinSUN,[name], MD Patient coming from: Home  Chief Complaint: fall and R lig/hip pain  HPI: Mackenzie SinghMartha F [name] is a 70 y.o. female with medical history significant of arthritis, and ICH s/p fall.    Discharged 3 days ago after admission for subarachnoid hemorrhage after fall and syncopal episode. Since discharge pt has been feeling gernally well though endorses persistent generalized mild weakness and gate instability. Overnight patient endorses falling off of chair and striking the floor. Immediate right hip pain. Denies any LOC, head trauma, neck stiffness, fevers, cough, shortness of breath, dysuria, frequency, back pain. Persistent R hip pain. Percocet w/ some benefit. Worse w/ movement. Improves w/ rest.    ED Course: objective findings outlined below. Found to have a R hip fracture. Ortho called for surgical consult  Review of Systems: As per HPI otherwise 10 point review of systems negative.   Ambulatory Status:normal  Past Medical History  Diagnosis Date  . Vein, varicose   . Arthritis   . Vitamin D deficiency     Past Surgical History  Procedure Laterality Date  . Hip surgery    . Joint replacement      Social History   Social History  . Marital Status: Divorced    Spouse Name: N/A  . Number of Children: N/A  . Years of Education: N/A   Occupational History  . Not on file.   Social History Main Topics  . Smoking status: Former Smoker    Quit date: 01/12/2013  . Smokeless tobacco: Never Used  . Alcohol Use: Yes     Comment: intermittent, unable to quantify  . Drug Use: No  . Sexual Activity: Not on file   Other Topics Concern  . Not on file   Social History Narrative    No Known Allergies  Family History  Problem Relation Age of Onset  . Adopted: Yes    Prior to Admission medications   Medication Sig Start Date End Date Taking? Authorizing Provider    oxyCODONE-acetaminophen (PERCOCET/ROXICET) 5-325 MG tablet Take 1 tablet by mouth every 4 (four) hours as needed (pain).  05/11/16  Yes Historical Provider, MD  traMADol (ULTRAM) 50 MG tablet Take 50 mg by mouth 4 (four) times daily as needed (pain).  05/11/16  Yes Historical Provider, MD  traZODone (DESYREL) 50 MG tablet Take 25 mg by mouth at bedtime as needed for sleep.  03/10/16  Yes Historical Provider, MD    Physical Exam: Filed Vitals:   05/13/16 1130 05/13/16 1200 05/13/16 1330 05/13/16 1400  BP: 136/75 119/70 118/67 123/61  Pulse: 86 70 65 64  Temp:      TempSrc:      Resp:   16 10  SpO2: 91% 92% 93% 92%     General: Frail and cachectic  Eyes:  PERRL, EOMI, normal lids, iris ENT:  grossly normal hearing, lips & tongue, mmm Neck: no LAD, masses or thyromegaly Cardiovascular:  RRR, II/VI systolic muirmur. No LE edema.  Respiratory:  CTA bilaterally, no w/r/r. Normal respiratory effort. Abdomen:  soft, ntnd, NABS Skin:  Ecchymosis noted. no rash or induration seen on limited exam Musculoskeletal: RLE extended and unwilling to move due to pain. puleses and sensation intact. Other extremities intact w/ FROM Psychiatric:  grossly normal mood and affect, speech fluent and appropriate, AOx3 Neurologic:  CN 2-12 grossly intact, moves all extremities in coordinated fashion, sensation intact  Labs on  Admission: I have personally reviewed following labs and imaging studies  CBC:  Recent Labs Lab 05/13/16 0820  WBC 9.8  NEUTROABS 8.2*  HGB 10.2*  HCT 29.8*  MCV 106.8*  PLT 330   Basic Metabolic Panel:  Recent Labs Lab 05/13/16 0820  NA 129*  K 2.8*  CL 89*  CO2 33*  GLUCOSE 121*  BUN 8  CREATININE 0.42*  CALCIUM 8.2*   GFR: Estimated Creatinine Clearance: 49.8 mL/min (by C-G formula based on Cr of 0.42). Liver Function Tests: No results for input(s): AST, ALT, ALKPHOS, BILITOT, PROT, ALBUMIN in the last 168 hours. No results for input(s): LIPASE, AMYLASE in the  last 168 hours. No results for input(s): AMMONIA in the last 168 hours. Coagulation Profile:  Recent Labs Lab 05/13/16 0820  INR 1.20   Cardiac Enzymes: No results for input(s): CKTOTAL, CKMB, CKMBINDEX, TROPONINI in the last 168 hours. BNP (last 3 results) No results for input(s): PROBNP in the last 8760 hours. HbA1C: No results for input(s): HGBA1C in the last 72 hours. CBG: No results for input(s): GLUCAP in the last 168 hours. Lipid Profile: No results for input(s): CHOL, HDL, LDLCALC, TRIG, CHOLHDL, LDLDIRECT in the last 72 hours. Thyroid Function Tests: No results for input(s): TSH, T4TOTAL, FREET4, T3FREE, THYROIDAB in the last 72 hours. Anemia Panel: No results for input(s): VITAMINB12, FOLATE, FERRITIN, TIBC, IRON, RETICCTPCT in the last 72 hours. Urine analysis:    Component Value Date/Time   COLORURINE YELLOW 05/09/2016 1730   APPEARANCEUR CLEAR 05/09/2016 1730   LABSPEC 1.024 05/09/2016 1730   PHURINE 8.0 05/09/2016 1730   GLUCOSEU NEGATIVE 05/09/2016 1730   HGBUR NEGATIVE 05/09/2016 1730   HGBUR negative 11/29/2009 0812   BILIRUBINUR NEGATIVE 05/09/2016 1730   KETONESUR 15* 05/09/2016 1730   PROTEINUR NEGATIVE 05/09/2016 1730   UROBILINOGEN 0.2 11/29/2009 0812   NITRITE NEGATIVE 05/09/2016 1730   LEUKOCYTESUR SMALL* 05/09/2016 1730    Creatinine Clearance: Estimated Creatinine Clearance: 49.8 mL/min (by C-G formula based on Cr of 0.42).  Sepsis Labs: (procalcitonin:4,lacticidven:4) ) Recent Results (from the past 240 hour(s))  MRSA PCR Screening     Status: None   Collection Time: 05/04/16  6:43 PM  Result Value Ref Range Status   MRSA by PCR NEGATIVE NEGATIVE Final    Comment:        The GeneXpert MRSA Assay (FDA approved for NASAL specimens only), is one component of a comprehensive MRSA colonization surveillance program. It is not intended to diagnose MRSA infection nor to guide or monitor treatment for MRSA infections.     Culture, Urine     Status: Abnormal   Collection Time: 05/09/16  5:29 PM  Result Value Ref Range Status   Specimen Description URINE, CLEAN CATCH  Final   Special Requests NONE  Final   Culture >=100,000 COLONIES/mL ESCHERICHIA COLI (A)  Final   Report Status 05/11/2016 FINAL  Final   Organism ID, Bacteria ESCHERICHIA COLI (A)  Final      Susceptibility   Escherichia coli - MIC*    AMPICILLIN <=2 SENSITIVE Sensitive     CEFAZOLIN <=4 SENSITIVE Sensitive     CEFTRIAXONE <=1 SENSITIVE Sensitive     CIPROFLOXACIN <=0.25 SENSITIVE Sensitive     GENTAMICIN <=1 SENSITIVE Sensitive     IMIPENEM <=0.25 SENSITIVE Sensitive     NITROFURANTOIN <=16 SENSITIVE Sensitive     TRIMETH/SULFA <=20 SENSITIVE Sensitive     AMPICILLIN/SULBACTAM <=2 SENSITIVE Sensitive     PIP/TAZO <=4 SENSITIVE Sensitive     * >=  100,000 COLONIES/mL ESCHERICHIA COLI     Radiological Exams on Admission: Ct Head Wo Contrast  05/13/2016  CLINICAL DATA:  Follow-up intracranial hemorrhage post fall on 05/04/2016, discharge on 05/11/2016, fell again last night out of a chair, bruises on face EXAM: CT HEAD WITHOUT CONTRAST TECHNIQUE: Contiguous axial images were obtained from the base of the skull through the vertex without intravenous contrast. COMPARISON:  05/08/2016 FINDINGS: Generalized atrophy. Stable ventricular morphology without hydrocephalus. Large intra cerebral hematoma again identified in LEFT frontal lobe with surrounding vasogenic edema, high attenuation acute blood component appearing slightly smaller and less well defined. Minimal LEFT-to-RIGHT midline shift again seen approximately 3 mm. Mild persistent effacement of sulci in LEFT frontal lobe. No new areas of intracranial hemorrhage, mass lesion or acute infarction. No extra-axial fluid collections. Atherosclerotic calcifications at the carotid siphons. Persistent air-fluid level RIGHT sphenoid sinus. Paranasal sinuses and mastoid air cells otherwise clear. Bones  demineralized but intact. IMPRESSION: Large intra cerebral hematoma identified previously in LEFT frontal lobe appears minimally smaller and less well defined though persistent surrounding vasogenic edema and 3 mm of LEFT-to-RIGHT midline shift remain. No new intracranial abnormalities. Persistent air-fluid level sphenoid sinus. Electronically Signed   By: Ulyses Southward M.D.   On: 05/13/2016 10:01   Dg Hips Bilat With Pelvis 3-4 Views  05/13/2016  CLINICAL DATA:  Pain following fall EXAM: DG HIP (WITH OR WITHOUT PELVIS) 3-4V BILAT COMPARISON:  May 04, 2016 FINDINGS: Images of frontal pelvis as well as frontal and lateral hips bilaterally obtained. There is a subcapital femoral neck fracture on the right with varus angulation of the fracture site and slight superior migration of the right femoral shaft with respect to the femoral head. There is a prior fracture of the right ischium in near anatomic alignment. 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 Bones are osteoporotic. No dislocations. IMPRESSION: Subcapital femoral neck fracture on the right with varus angulation at the fracture site. 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 Fracture of the right ischium in near anatomic alignment. Bones osteoporotic. Electronically Signed   By: Bretta Bang III M.D.   On: 05/13/2016 09:03    Assessment/Plan Active Problems:   Insomnia   Subarachnoid bleed (HCC)   Protein-calorie malnutrition, severe   Hip fracture (HCC)   Physical deconditioning   Hyponatremia   Hypokalemia   Hyperglycemia   Macrocytic anemia   R femoral neck fracture: Dr Luiz Blare consulted by ED and to evaluate pt. H/o L hip replacement.  - f/u [facility] recs - suspect surgery on either 7/12 or 713 - NPO, EKG, Coags, (recent CXR w/o acute abnormality)  Subaracnoid hemorrhage. Pt syncopized on 7/2 and struck her head sustaining ICH. Discharged on 7/10 after bleed stabilized. CT  on day of admission shows improvement in condition. Discussed new CT results w/ on call neurosurgery Dr Franky Macho who states that there is no further workup/mgt needed for her condition.  - Further imaging if develops any worriesome neuro sx  Hyponatremia: 129 on admission. 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  - osmolality studies - NS IVF - BMET 12  HypoK: 2.8 - Mag - Replete   Hyperglyecmia: 121. Suspect from acute stress. Last A1c 4.8 - trend w/ BMET   Insomnia: - continue trazodone  Physical deconditioning/protein calorie malnutrition: low total protein and albumin. Frail and cachectic appearing. Multiple mechanical falls recently. Poor nutritional intake since ICH.  - Nutrition/pt/ot - prealubmin  Psych: pt very tearful. Suspect depression and emotional lability after ICH which is somewhat expected.  [facility] consult if not improving   Anemia: Hgb 10.2. Near baseline. Macrocytic - Anemia panel - cbc in am    DVT prophylaxis: SCD  Code Status: FULL  Family Communication: none  Disposition Plan: pending surgery and improvement   Consults called: Ortho  Admission status: inpt    MERRELL, DAVID J MD [facility]  If 7PM-7AM, please contact night-coverage [email or web address] Password [facility]  05/13/2016, 2:11 PM

## 2016-05-13 NOTE — ED Notes (Signed)
Pt made aware of bed assignment 

## 2016-05-13 NOTE — ED Notes (Addendum)
Lost her balance last night and fell out of chair and hurt  her rt hip  with rads to left side . Pt was seen on 7/3 and admitted for head bleed after fall then. Pt d/c on  7/10, has been living at home. Pt states she took 2 5 mg percocets before ems arrival, pt has old brusing to both eyes

## 2016-05-13 NOTE — ED Provider Notes (Addendum)
CSN: [id number]     Arrival date & time 05/13/16  7829 History   First MD Initiated Contact with Patient 05/13/16 0736     Chief Complaint  Patient presents with  . Back Pain  . Fall    HPI  Patient presents after fall occurred last night. Patient is a notable history of fall almost 2 weeks ago, resulting in subarachnoid hemorrhage, with hospitalization, discharge 3 days ago. She notes that since discharge she has been recovering generally well, though has persistent generalized weakness, unsteadiness. Last night she had a fall, no loss of consciousness, no head trauma. Since that time there's been pain persistently in the right hip, though radiating to the left hip as well. No distal loss of sensation or weakness. Transient relief with Percocet. Pain is worse with motion.   Past Medical History  Diagnosis Date  . Vein, varicose   . Arthritis   . Vitamin D deficiency    Past Surgical History  Procedure Laterality Date  . Hip surgery    . Joint replacement     Family History  Problem Relation Age of Onset  . Adopted: Yes   Social History  Substance Use Topics  . Smoking status: Former Smoker    Quit date: 01/12/2013  . Smokeless tobacco: Never Used  . Alcohol Use: Yes     Comment: intermittent, unable to quantify   OB History    No data available     Review of Systems  Constitutional:       Per HPI, otherwise negative  HENT:       Per HPI, otherwise negative  Respiratory:       Per HPI, otherwise negative  Cardiovascular:       Per HPI, otherwise negative  Gastrointestinal: Negative for vomiting.  Endocrine:       Negative aside from HPI  Genitourinary:       Neg aside from HPI   Musculoskeletal:       Per HPI, otherwise negative  Skin:       Multiple areas of bruising  Neurological: Positive for weakness. Negative for syncope and numbness.      Allergies  Review of patient's allergies indicates no known allergies.  Home Medications   Prior to  Admission medications   Medication Sig Start Date End Date Taking? Authorizing Provider  oxyCODONE-acetaminophen (PERCOCET/ROXICET) 5-325 MG tablet Take 1 tablet by mouth every 4 (four) hours as needed (pain).  05/11/16  Yes Historical Provider, MD  traMADol (ULTRAM) 50 MG tablet Take 50 mg by mouth 4 (four) times daily as needed (pain).  05/11/16  Yes Historical Provider, MD  traZODone (DESYREL) 50 MG tablet Take 25 mg by mouth at bedtime as needed for sleep.  03/10/16  Yes Historical Provider, MD   BP 119/70 mmHg  Pulse 70  Temp(Src) 98.9 F (37.2 C) (Oral)  Resp 16  SpO2 92% Physical Exam  Constitutional: She is oriented to person, place, and time. She appears well-developed and well-nourished. She has a sickly appearance.  HENT:  Head: Normocephalic and atraumatic.  Eyes: Conjunctivae and EOM are normal.  Cardiovascular: Normal rate and regular rhythm.   Pulmonary/Chest: Effort normal and breath sounds normal. No stridor. No respiratory distress.  Abdominal: She exhibits no distension.  Musculoskeletal: She exhibits no edema.       Legs: Neurological: She is alert and oriented to person, place, and time. No cranial nerve deficit.  Skin: Skin is warm and dry.  Psychiatric: She has a  normal mood and affect.  Nursing note and vitals reviewed.   ED Course  Procedures (including critical care time) Labs Review Labs Reviewed  BASIC METABOLIC PANEL - Abnormal; Notable for the following:    Sodium 129 (*)    Potassium 2.8 (*)    Chloride 89 (*)    CO2 33 (*)    Glucose, Bld 121 (*)    Creatinine, Ser 0.42 (*)    Calcium 8.2 (*)    All other components within normal limits  CBC WITH DIFFERENTIAL/PLATELET - Abnormal; Notable for the following:    RBC 2.79 (*)    Hemoglobin 10.2 (*)    HCT 29.8 (*)    MCV 106.8 (*)    MCH 36.6 (*)    Neutro Abs 8.2 (*)    Lymphs Abs 0.6 (*)    All other components within normal limits  PROTIME-INR - Abnormal; Notable for the following:     Prothrombin Time 15.4 (*)    All other components within normal limits  TYPE AND SCREEN  ABO/RH    Imaging Review Ct Head Wo Contrast  05/13/2016  CLINICAL DATA:  Follow-up intracranial hemorrhage post fall on 05/04/2016, discharge on 05/11/2016, fell again last night out of a chair, bruises on face EXAM: CT HEAD WITHOUT CONTRAST TECHNIQUE: Contiguous axial images were obtained from the base of the skull through the vertex without intravenous contrast. COMPARISON:  05/08/2016 FINDINGS: Generalized atrophy. Stable ventricular morphology without hydrocephalus. Large intra cerebral hematoma again identified in LEFT frontal lobe with surrounding vasogenic edema, high attenuation acute blood component appearing slightly smaller and less well defined. Minimal LEFT-to-RIGHT midline shift again seen approximately 3 mm. Mild persistent effacement of sulci in LEFT frontal lobe. No new areas of intracranial hemorrhage, mass lesion or acute infarction. No extra-axial fluid collections. Atherosclerotic calcifications at the carotid siphons. Persistent air-fluid level RIGHT sphenoid sinus. Paranasal sinuses and mastoid air cells otherwise clear. Bones demineralized but intact. IMPRESSION: Large intra cerebral hematoma identified previously in LEFT frontal lobe appears minimally smaller and less well defined though persistent surrounding vasogenic edema and 3 mm of LEFT-to-RIGHT midline shift remain. No new intracranial abnormalities. Persistent air-fluid level sphenoid sinus. Electronically Signed   By: Ulyses Southward M.D.   On: 05/13/2016 10:01   Dg Hips Bilat With Pelvis 3-4 Views  05/13/2016  CLINICAL DATA:  Pain following fall EXAM: DG HIP (WITH OR WITHOUT PELVIS) 3-4V BILAT COMPARISON:  May 04, 2016 FINDINGS: Images of frontal pelvis as well as frontal and lateral hips bilaterally obtained. There is a subcapital femoral neck fracture on the right with varus angulation of the fracture site and slight superior  migration of the right femoral shaft with respect to the femoral head. There is a prior fracture of the right ischium in near anatomic alignment. 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 Bones are osteoporotic. No dislocations. IMPRESSION: Subcapital femoral neck fracture on the right with varus angulation at the fracture site. 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 Fracture of the right ischium in near anatomic alignment. Bones osteoporotic. Electronically Signed   By: Bretta Bang III M.D.   On: 05/13/2016 09:03   I have personally reviewed and evaluated these images and lab results as part of my medical decision-making.  On repeat exam the patient remains in similar condition. I discussed her case with her orthopedic physician. Patient will require admission to the hospitalist service, with orthopedic consultation for surgical  repair.   EKG with sinus rhythm, rate 67, T-wave flattening, artifact, abnormal   MDM  Elderly female with notable recent history of fall with subarachnoid hemorrhage now presents several days after discharge, after another fall, now with right hip fracture. Patient has multiple nodular abnormalities, required fluid resuscitation.  However, the patient did have relatively reassuring CT scan, notable following recent trauma.  After discussion with her orthopedic physician, patient admitted to the hospitalist team for further evaluation and management.  Gerhard Munchobert Linley Moxley, MD 05/13/16 1259  Gerhard Munchobert Analeah Brame, MD 05/13/16 [phone]

## 2016-05-13 NOTE — ED Notes (Signed)
Called pharmacy and requested fentanyl patch be sent up.

## 2016-05-13 NOTE — Consult Note (Signed)
Reason for Consult:right hip pain after fall. Referring Physician: hospitalists  Mackenzie [name] is an 70 y.o. female.  HPI: the patient is 70 year old female who has had a previous left femoral neck fracture.  She was treated with a bipolar hemiarthroplasty about 10 years ago.  She is complaining of groin pain ever since that time.  She has not been back to see [place] for the last 6 or 7 years.  She does state that she's been having significant ongoing pain ever since that time.  She comes in today for initial evaluation status post fall on the right hip.  Past Medical History  Diagnosis Date  . Vein, varicose   . Arthritis   . Vitamin D deficiency     Past Surgical History  Procedure Laterality Date  . Hip surgery    . Joint replacement      Family History  Problem Relation Age of Onset  . Adopted: Yes    Social History:  reports that she quit smoking about 3 years ago. She has never used smokeless tobacco. She reports that she drinks alcohol. She reports that she does not use illicit drugs.  Allergies: No Known Allergies  Medications: I have reviewed the patient's current medications.  Results for orders placed or performed during the hospital encounter of 05/13/16 (from the past 48 hour(s))  Basic metabolic panel     Status: Abnormal   Collection Time: 05/13/16  8:20 AM  Result Value Ref Range   Sodium 129 (L) 135 - 145 mmol/L   Potassium 2.8 (L) 3.5 - 5.1 mmol/L   Chloride 89 (L) 101 - 111 mmol/L   CO2 33 (H) 22 - 32 mmol/L   Glucose, Bld 121 (H) 65 - 99 mg/dL   BUN 8 6 - 20 mg/dL   Creatinine, Ser 0.42 (L) 0.44 - 1.00 mg/dL   Calcium 8.2 (L) 8.9 - 10.3 mg/dL   GFR calc non Af Amer >60 >60 mL/min   GFR calc Af Amer >60 >60 mL/min    Comment: (NOTE) The eGFR has been calculated using the CKD EPI equation. This calculation has not been validated in all clinical situations. eGFR's persistently <60 mL/min signify possible Chronic Kidney Disease.    Anion gap 7 5 - 15   CBC WITH DIFFERENTIAL     Status: Abnormal   Collection Time: 05/13/16  8:20 AM  Result Value Ref Range   WBC 9.8 4.0 - 10.5 K/uL   RBC 2.79 (L) 3.87 - 5.11 MIL/uL   Hemoglobin 10.2 (L) 12.0 - 15.0 g/dL   HCT 29.8 (L) 36.0 - 46.0 %   MCV 106.8 (H) 78.0 - 100.0 fL   MCH 36.6 (H) 26.0 - 34.0 pg   MCHC 34.2 30.0 - 36.0 g/dL   RDW 12.4 11.5 - 15.5 %   Platelets 330 150 - 400 K/uL   Neutrophils Relative % 84 %   Neutro Abs 8.2 (H) 1.7 - 7.7 K/uL   Lymphocytes Relative 6 %   Lymphs Abs 0.6 (L) 0.7 - 4.0 K/uL   Monocytes Relative 10 %   Monocytes Absolute 1.0 0.1 - 1.0 K/uL   Eosinophils Relative 0 %   Eosinophils Absolute 0.0 0.0 - 0.7 K/uL   Basophils Relative 0 %   Basophils Absolute 0.0 0.0 - 0.1 K/uL  Protime-INR     Status: Abnormal   Collection Time: 05/13/16  8:20 AM  Result Value Ref Range   Prothrombin Time 15.4 (H) 11.6 - 15.2 seconds  INR 1.20 0.00 - 1.49  Type and screen [place]     Status: None   Collection Time: 05/13/16  8:20 AM  Result Value Ref Range   ABO/RH(D) A POS    Antibody Screen NEG    Sample Expiration 05/16/2016   ABO/Rh     Status: None   Collection Time: 05/13/16  8:20 AM  Result Value Ref Range   ABO/RH(D) A POS   Magnesium     Status: None   Collection Time: 05/13/16  2:18 PM  Result Value Ref Range   Magnesium 1.7 1.7 - 2.4 mg/dL  Phosphorus     Status: None   Collection Time: 05/13/16  2:18 PM  Result Value Ref Range   Phosphorus 2.6 2.5 - 4.6 mg/dL  Osmolality     Status: Abnormal   Collection Time: 05/13/16  2:18 PM  Result Value Ref Range   Osmolality 273 (L) 275 - 295 mOsm/kg  Vitamin B12     Status: Abnormal   Collection Time: 05/13/16  2:18 PM  Result Value Ref Range   Vitamin B-12 1223 (H) 180 - 914 pg/mL    Comment: (NOTE) This assay is not validated for testing neonatal or myeloproliferative syndrome specimens for Vitamin B12 levels.   Folate     Status: None   Collection Time: 05/13/16  2:18 PM   Result Value Ref Range   Folate 10.8 >5.9 ng/mL  Iron and TIBC     Status: Abnormal   Collection Time: 05/13/16  2:18 PM  Result Value Ref Range   Iron 36 28 - 170 ug/dL   TIBC 157 (L) 250 - 450 ug/dL   Saturation Ratios 23 10.4 - 31.8 %   UIBC 121 ug/dL  Ferritin     Status: Abnormal   Collection Time: 05/13/16  2:18 PM  Result Value Ref Range   Ferritin 596 (H) 11 - 307 ng/mL  Reticulocytes     Status: Abnormal   Collection Time: 05/13/16  2:18 PM  Result Value Ref Range   Retic Ct Pct 1.7 0.4 - 3.1 %   RBC. 2.64 (L) 3.87 - 5.11 MIL/uL   Retic Count, Manual 44.9 19.0 - 186.0 K/uL  Prealbumin     Status: Abnormal   Collection Time: 05/13/16  2:18 PM  Result Value Ref Range   Prealbumin 5.0 (L) 18 - 38 mg/dL  Osmolality, urine     Status: Abnormal   Collection Time: 05/13/16  2:20 PM  Result Value Ref Range   Osmolality, Ur 278 (L) 300 - 900 mOsm/kg  Urinalysis, Routine w reflex microscopic (not at [facility])     Status: Abnormal   Collection Time: 05/13/16  2:20 PM  Result Value Ref Range   Color, Urine YELLOW YELLOW   APPearance CLOUDY (A) CLEAR   Specific Gravity, Urine 1.015 1.005 - 1.030   pH 6.0 5.0 - 8.0   Glucose, UA NEGATIVE NEGATIVE mg/dL   Hgb urine dipstick NEGATIVE NEGATIVE   Bilirubin Urine NEGATIVE NEGATIVE   Ketones, ur NEGATIVE NEGATIVE mg/dL   Protein, ur NEGATIVE NEGATIVE mg/dL   Nitrite NEGATIVE NEGATIVE   Leukocytes, UA SMALL (A) NEGATIVE  Sodium, urine, random     Status: None   Collection Time: 05/13/16  2:20 PM  Result Value Ref Range   Sodium, Ur 11 mmol/L  Urine microscopic-add on     Status: Abnormal   Collection Time: 05/13/16  2:20 PM  Result Value Ref Range   Squamous  Epithelial / LPF 6-30 (A) NONE SEEN   WBC, UA 0-5 0 - 5 WBC/hpf   RBC / HPF 0-5 0 - 5 RBC/hpf   Bacteria, UA MANY (A) NONE SEEN  Basic metabolic panel     Status: Abnormal   Collection Time: 05/13/16  7:19 PM  Result Value Ref Range   Sodium 131 (L) 135 - 145 mmol/L    Potassium 3.1 (L) 3.5 - 5.1 mmol/L   Chloride 100 (L) 101 - 111 mmol/L   CO2 26 22 - 32 mmol/L   Glucose, Bld 80 65 - 99 mg/dL   BUN 6 6 - 20 mg/dL   Creatinine, Ser 0.37 (L) 0.44 - 1.00 mg/dL   Calcium 7.5 (L) 8.9 - 10.3 mg/dL   GFR calc non Af Amer >60 >60 mL/min   GFR calc Af Amer >60 >60 mL/min    Comment: (NOTE) The eGFR has been calculated using the CKD EPI equation. This calculation has not been validated in all clinical situations. eGFR's persistently <60 mL/min signify possible Chronic Kidney Disease.    Anion gap 5 5 - 15    Ct Head Wo Contrast  05/13/2016  CLINICAL DATA:  Follow-up intracranial hemorrhage post fall on 05/04/2016, discharge on 05/11/2016, fell again last night out of a chair, bruises on face EXAM: CT HEAD WITHOUT CONTRAST TECHNIQUE: Contiguous axial images were obtained from the base of the skull through the vertex without intravenous contrast. COMPARISON:  05/08/2016 FINDINGS: Generalized atrophy. Stable ventricular morphology without hydrocephalus. Large intra cerebral hematoma again identified in LEFT frontal lobe with surrounding vasogenic edema, high attenuation acute blood component appearing slightly smaller and less well defined. Minimal LEFT-to-RIGHT midline shift again seen approximately 3 mm. Mild persistent effacement of sulci in LEFT frontal lobe. No new areas of intracranial hemorrhage, mass lesion or acute infarction. No extra-axial fluid collections. Atherosclerotic calcifications at the carotid siphons. Persistent air-fluid level RIGHT sphenoid sinus. Paranasal sinuses and mastoid air cells otherwise clear. Bones demineralized but intact. IMPRESSION: Large intra cerebral hematoma identified previously in LEFT frontal lobe appears minimally smaller and less well defined though persistent surrounding vasogenic edema and 3 mm of LEFT-to-RIGHT midline shift remain. No new intracranial abnormalities. Persistent air-fluid level sphenoid sinus. Electronically  Signed   By: Lavonia Dana M.D.   On: 05/13/2016 10:01   Dg Hips Bilat With Pelvis 3-4 Views  05/13/2016  CLINICAL DATA:  Pain following fall EXAM: DG HIP (WITH OR WITHOUT PELVIS) 3-4V BILAT COMPARISON:  May 04, 2016 FINDINGS: Images of frontal pelvis as well as frontal and lateral hips bilaterally obtained. There is a subcapital femoral neck fracture on the right with varus angulation of the fracture site and slight superior migration of the right femoral shaft with respect to the femoral head. There is a prior fracture of the right ischium in near anatomic alignment. 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 Bones are osteoporotic. No dislocations. IMPRESSION: Subcapital femoral neck fracture on the right with varus angulation at the fracture site. 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 Fracture of the right ischium in near anatomic alignment. Bones osteoporotic. Electronically Signed   By: Lowella Grip III M.D.   On: 05/13/2016 09:03    ROS  ROS: I have reviewed the patient's review of systems thoroughly and there are no positive responses as relates to the HPI. Exam: Blood pressure 145/82, pulse 81, temperature 98.6 F (37 C), temperature source Oral, resp. rate  16, SpO2 95 %. Physical Exam Well-developed well-nourished patient in no acute distress. Alert and oriented x3 HEENT:within normal limits Cardiac: Regular rate and rhythm Pulmonary: Lungs clear to auscultation Abdomen: Soft and nontender.  Normal active bowel sounds  Musculoskeletal: (right hip: External rotated and shortened.  Pain to all range of motion.  Neurovascularly intact distally. Assessment/Plan: 70 year old female with subcapital femoral neck fracture displaced on the right.  She needs surgical treatment. I will plan hemiarthroplasty of the right side to an anterior approach.  I am hopeful this will allow her to be more mobile and leave the hospital sooner.   We will set this up at the convenience of the operating room and my schedule.  I did offer her surgical intervention via another orthopedic surgeon given that I was not able to treat her today.  I think she did was to take care of her and is willing to wait until tomorrow.I have had a prolonged discussion with the patient regarding the risk and benefits of the surgical procedure.  The patient understands the risks include but are not limited to bleeding, infection and failure of the surgery to cure the problem and need for further surgery.  The patient understands there is a slight risk of death at the time of surgery.  The patient understands these risks along with the potential benefits and wishes to proceed with surgical intervention.  The patient will be followed in the office in the postoperative period.  Melany Wiesman L 05/13/2016, 9:57 PM

## 2016-05-14 ENCOUNTER — Inpatient Hospital Stay (HOSPITAL_COMMUNITY): Payer: Medicare Other | Admitting: Anesthesiology

## 2016-05-14 ENCOUNTER — Encounter (HOSPITAL_COMMUNITY): Payer: Self-pay | Admitting: Anesthesiology

## 2016-05-14 ENCOUNTER — Encounter (HOSPITAL_COMMUNITY)
Admission: EM | Disposition: A | Discharge status: Home Health | Payer: Self-pay | Source: Home / Self Care | Attending: Internal Medicine

## 2016-05-14 ENCOUNTER — Inpatient Hospital Stay (HOSPITAL_COMMUNITY): Payer: Medicare Other

## 2016-05-14 HISTORY — PX: ANTERIOR APPROACH HEMI HIP ARTHROPLASTY: SHX6690

## 2016-05-14 LAB — BASIC METABOLIC PANEL
Anion gap: 5 (ref 5–15)
BUN: 6 mg/dL (ref 6–20)
CALCIUM: 8.1 mg/dL — AB (ref 8.9–10.3)
CHLORIDE: 100 mmol/L — AB (ref 101–111)
CO2: 29 mmol/L (ref 22–32)
CREATININE: 0.35 mg/dL — AB (ref 0.44–1.00)
GFR calc non Af Amer: >60 mL/min (ref >60)
Glucose, Bld: 100 mg/dL — ABNORMAL HIGH (ref 65–99)
Potassium: 3.7 mmol/L (ref 3.5–5.1)
SODIUM: 134 mmol/L — AB (ref 135–145)

## 2016-05-14 LAB — CBC
HEMATOCRIT: 28.1 % — AB (ref 36.0–46.0)
HEMOGLOBIN: 9.7 g/dL — AB (ref 12.0–15.0)
MCH: 37.9 pg — AB (ref 26.0–34.0)
MCHC: 34.5 g/dL (ref 30.0–36.0)
MCV: 109.8 fL — ABNORMAL HIGH (ref 78.0–100.0)
Platelets: 351 10*3/uL (ref 150–400)
RBC: 2.56 MIL/uL — ABNORMAL LOW (ref 3.87–5.11)
RDW: 13.2 % (ref 11.5–15.5)
WBC: 8.6 10*3/uL (ref 4.0–10.5)

## 2016-05-14 SURGERY — HEMIARTHROPLASTY, HIP, DIRECT ANTERIOR APPROACH, FOR FRACTURE
Anesthesia: General | Site: Hip | Laterality: Right

## 2016-05-14 MED ORDER — DEXTROSE 5 % IV SOLN
500.0000 mg | Freq: Four times a day (QID) | INTRAVENOUS | Status: DC | PRN
  Filled 2016-05-14: qty 5

## 2016-05-14 MED ORDER — ALBUMIN HUMAN 5 % IV SOLN
INTRAVENOUS | Status: DC | PRN
  Administered 2016-05-14: 17:00:00 via INTRAVENOUS

## 2016-05-14 MED ORDER — ACETAMINOPHEN 650 MG RE SUPP: 650.0000 mg | Freq: Four times a day (QID) | RECTAL | Status: DC | PRN

## 2016-05-14 MED ORDER — ONDANSETRON HCL 4 MG/2ML IJ SOLN
INTRAMUSCULAR | Status: AC
  Filled 2016-05-14: qty 2

## 2016-05-14 MED ORDER — DEXAMETHASONE SODIUM PHOSPHATE 10 MG/ML IJ SOLN
INTRAMUSCULAR | Status: AC
  Filled 2016-05-14: qty 1

## 2016-05-14 MED ORDER — CEFAZOLIN SODIUM-DEXTROSE 2-4 GM/100ML-% IV SOLN
2.0000 g | INTRAVENOUS | Status: DC
  Filled 2016-05-14: qty 100

## 2016-05-14 MED ORDER — ENSURE ENLIVE PO LIQD
237.0000 mL | Freq: Two times a day (BID) | ORAL | Status: DC
  Administered 2016-05-15 – 2016-05-17 (×4): 237 mL via ORAL

## 2016-05-14 MED ORDER — ACETAMINOPHEN 325 MG PO TABS: 650.0000 mg | ORAL_TABLET | Freq: Four times a day (QID) | ORAL | Status: DC | PRN

## 2016-05-14 MED ORDER — PHENYLEPHRINE 40 MCG/ML (10ML) SYRINGE FOR IV PUSH (FOR BLOOD PRESSURE SUPPORT)
PREFILLED_SYRINGE | INTRAVENOUS | Status: AC
  Filled 2016-05-14: qty 10

## 2016-05-14 MED ORDER — BUPIVACAINE-EPINEPHRINE (PF) 0.5% -1:200000 IJ SOLN
INTRAMUSCULAR | Status: AC
  Filled 2016-05-14: qty 30

## 2016-05-14 MED ORDER — FERROUS SULFATE 325 (65 FE) MG PO TABS: 325.0000 mg | ORAL_TABLET | Freq: Every day | ORAL | Status: DC

## 2016-05-14 MED ORDER — ONDANSETRON HCL 4 MG/2ML IJ SOLN
INTRAMUSCULAR | Status: DC | PRN
  Administered 2016-05-14: 4 mg via INTRAVENOUS

## 2016-05-14 MED ORDER — SUCCINYLCHOLINE CHLORIDE 20 MG/ML IJ SOLN
INTRAMUSCULAR | Status: DC | PRN
  Administered 2016-05-14: 100 mg via INTRAVENOUS

## 2016-05-14 MED ORDER — TIZANIDINE HCL 2 MG PO TABS: 2.0000 mg | ORAL_TABLET | Freq: Three times a day (TID) | ORAL | Status: AC | PRN

## 2016-05-14 MED ORDER — HYDROMORPHONE HCL 1 MG/ML IJ SOLN
0.1000 mg | INTRAMUSCULAR | Status: DC | PRN
  Administered 2016-05-15 – 2016-05-16 (×2): 0.5 mg via INTRAVENOUS
  Filled 2016-05-14 (×2): qty 1

## 2016-05-14 MED ORDER — LACTATED RINGERS IV SOLN
INTRAVENOUS | Status: DC | PRN
  Administered 2016-05-14 (×2): via INTRAVENOUS

## 2016-05-14 MED ORDER — POVIDONE-IODINE 10 % EX SWAB: 2.0000 "application " | Freq: Once | CUTANEOUS | Status: DC

## 2016-05-14 MED ORDER — VECURONIUM BROMIDE 10 MG IV SOLR
INTRAVENOUS | Status: DC | PRN
  Administered 2016-05-14: 3 mg via INTRAVENOUS
  Administered 2016-05-14: 4 mg via INTRAVENOUS

## 2016-05-14 MED ORDER — MIDAZOLAM HCL 2 MG/2ML IJ SOLN
INTRAMUSCULAR | Status: AC
  Filled 2016-05-14: qty 2

## 2016-05-14 MED ORDER — OXYCODONE-ACETAMINOPHEN 5-325 MG PO TABS: 1.0000 | ORAL_TABLET | Freq: Four times a day (QID) | ORAL | Status: DC | PRN

## 2016-05-14 MED ORDER — FENTANYL CITRATE (PF) 250 MCG/5ML IJ SOLN
INTRAMUSCULAR | Status: AC
  Filled 2016-05-14: qty 5

## 2016-05-14 MED ORDER — EPHEDRINE 5 MG/ML INJ
INTRAVENOUS | Status: AC
  Filled 2016-05-14: qty 10

## 2016-05-14 MED ORDER — FENTANYL CITRATE (PF) 100 MCG/2ML IJ SOLN
INTRAMUSCULAR | Status: DC | PRN
  Administered 2016-05-14 (×2): 50 ug via INTRAVENOUS
  Administered 2016-05-14: 100 ug via INTRAVENOUS
  Administered 2016-05-14 (×3): 50 ug via INTRAVENOUS

## 2016-05-14 MED ORDER — CEFAZOLIN SODIUM-DEXTROSE 2-4 GM/100ML-% IV SOLN
2.0000 g | Freq: Four times a day (QID) | INTRAVENOUS | Status: AC
  Administered 2016-05-14 – 2016-05-15 (×2): 2 g via INTRAVENOUS
  Filled 2016-05-14 (×2): qty 100

## 2016-05-14 MED ORDER — CEFUROXIME AXETIL 500 MG PO TABS
500.0000 mg | ORAL_TABLET | Freq: Two times a day (BID) | ORAL | Status: DC
  Filled 2016-05-14 (×2): qty 1

## 2016-05-14 MED ORDER — CHLORHEXIDINE GLUCONATE 4 % EX LIQD
60.0000 mL | Freq: Once | CUTANEOUS | Status: AC
  Administered 2016-05-14: 4 via TOPICAL

## 2016-05-14 MED ORDER — SUGAMMADEX SODIUM 200 MG/2ML IV SOLN
INTRAVENOUS | Status: AC
  Filled 2016-05-14: qty 2

## 2016-05-14 MED ORDER — ALUM & MAG HYDROXIDE-SIMETH 200-200-20 MG/5ML PO SUSP: 30.0000 mL | ORAL | Status: DC | PRN

## 2016-05-14 MED ORDER — BUPIVACAINE-EPINEPHRINE (PF) 0.5% -1:200000 IJ SOLN
INTRAMUSCULAR | Status: DC | PRN
  Administered 2016-05-14: 30 mL

## 2016-05-14 MED ORDER — OXYCODONE-ACETAMINOPHEN 5-325 MG PO TABS
1.0000 | ORAL_TABLET | Freq: Four times a day (QID) | ORAL | Status: DC | PRN
  Administered 2016-05-15 (×2): 2 via ORAL
  Administered 2016-05-15 (×2): 1 via ORAL
  Administered 2016-05-15 – 2016-05-17 (×6): 2 via ORAL
  Filled 2016-05-14 (×5): qty 2
  Filled 2016-05-14: qty 1
  Filled 2016-05-14: qty 2
  Filled 2016-05-14 (×2): qty 1
  Filled 2016-05-14: qty 2
  Filled 2016-05-14: qty 1

## 2016-05-14 MED ORDER — 0.9 % SODIUM CHLORIDE (POUR BTL) OPTIME
TOPICAL | Status: DC | PRN
  Administered 2016-05-14: 1000 mL

## 2016-05-14 MED ORDER — TRAMADOL HCL 50 MG PO TABS
50.0000 mg | ORAL_TABLET | Freq: Four times a day (QID) | ORAL | Status: DC | PRN
  Administered 2016-05-15 – 2016-05-17 (×6): 50 mg via ORAL
  Filled 2016-05-14 (×7): qty 1

## 2016-05-14 MED ORDER — PROPOFOL 10 MG/ML IV BOLUS
INTRAVENOUS | Status: DC | PRN
  Administered 2016-05-14: 150 mg via INTRAVENOUS

## 2016-05-14 MED ORDER — METHOCARBAMOL 500 MG PO TABS
500.0000 mg | ORAL_TABLET | Freq: Four times a day (QID) | ORAL | Status: DC | PRN
  Administered 2016-05-15: 500 mg via ORAL
  Filled 2016-05-14 (×2): qty 1

## 2016-05-14 MED ORDER — TRAZODONE HCL 50 MG PO TABS
25.0000 mg | ORAL_TABLET | Freq: Every day | ORAL | Status: DC
  Administered 2016-05-14 – 2016-05-16 (×3): 25 mg via ORAL
  Filled 2016-05-14 (×3): qty 1

## 2016-05-14 MED ORDER — LACTATED RINGERS IV SOLN
INTRAVENOUS | Status: DC
  Administered 2016-05-14: 15:00:00 via INTRAVENOUS

## 2016-05-14 MED ORDER — TRANEXAMIC ACID 1000 MG/10ML IV SOLN
1000.0000 mg | INTRAVENOUS | Status: AC
  Administered 2016-05-14: 1000 mg via INTRAVENOUS
  Filled 2016-05-14: qty 10

## 2016-05-14 MED ORDER — TRANEXAMIC ACID 1000 MG/10ML IV SOLN
1000.0000 mg | INTRAVENOUS | Status: AC
  Filled 2016-05-14: qty 10

## 2016-05-14 MED ORDER — BUPIVACAINE HCL (PF) 0.5 % IJ SOLN
INTRAMUSCULAR | Status: AC
  Filled 2016-05-14: qty 30

## 2016-05-14 MED ORDER — CEFAZOLIN SODIUM 1 G IJ SOLR
INTRAMUSCULAR | Status: DC | PRN
  Administered 2016-05-14: 2 g via INTRAMUSCULAR

## 2016-05-14 MED ORDER — SUGAMMADEX SODIUM 200 MG/2ML IV SOLN
INTRAVENOUS | Status: DC | PRN
  Administered 2016-05-14: 100 mg via INTRAVENOUS

## 2016-05-14 MED ORDER — ASPIRIN EC 325 MG PO TBEC
325.0000 mg | DELAYED_RELEASE_TABLET | Freq: Two times a day (BID) | ORAL | Status: DC
  Administered 2016-05-15: 325 mg via ORAL
  Filled 2016-05-14: qty 1

## 2016-05-14 MED ORDER — LIDOCAINE 2% (20 MG/ML) 5 ML SYRINGE
INTRAMUSCULAR | Status: AC
  Filled 2016-05-14: qty 5

## 2016-05-14 MED ORDER — DEXAMETHASONE SODIUM PHOSPHATE 10 MG/ML IJ SOLN
INTRAMUSCULAR | Status: DC | PRN
  Administered 2016-05-14: 10 mg via INTRAVENOUS

## 2016-05-14 MED ORDER — LIDOCAINE HCL (CARDIAC) 20 MG/ML IV SOLN
INTRAVENOUS | Status: DC | PRN
  Administered 2016-05-14: 60 mg via INTRAVENOUS

## 2016-05-14 MED ORDER — ASPIRIN EC 325 MG PO TBEC: 325.0000 mg | DELAYED_RELEASE_TABLET | Freq: Two times a day (BID) | ORAL | Status: DC

## 2016-05-14 SURGICAL SUPPLY — 64 items
BENZOIN TINCTURE PRP APPL 2/3 (GAUZE/BANDAGES/DRESSINGS) ×2 IMPLANT
BLADE SAW SGTL 18X1.27X75 (BLADE) ×2 IMPLANT
BLADE SURG ROTATE 9660 (MISCELLANEOUS) IMPLANT
BNDG COHESIVE 6X5 TAN STRL LF (GAUZE/BANDAGES/DRESSINGS) IMPLANT
BNDG GAUZE ELAST 4 BULKY (GAUZE/BANDAGES/DRESSINGS) IMPLANT
CAPT HIP HEMI 2 ×2 IMPLANT
CELLS DAT CNTRL 66122 CELL SVR (MISCELLANEOUS) IMPLANT
CLSR STERI-STRIP ANTIMIC 1/2X4 (GAUZE/BANDAGES/DRESSINGS) IMPLANT
COVER PERINEAL POST (MISCELLANEOUS) ×2 IMPLANT
COVER SURGICAL LIGHT HANDLE (MISCELLANEOUS) ×2 IMPLANT
DECANTER SPIKE VIAL GLASS SM (MISCELLANEOUS) ×2 IMPLANT
DRAPE C-ARM 42X72 X-RAY (DRAPES) ×2 IMPLANT
DRAPE STERI IOBAN 125X83 (DRAPES) ×2 IMPLANT
DRAPE U-SHAPE 47X51 STRL (DRAPES) ×4 IMPLANT
DRSG AQUACEL AG ADV 3.5X10 (GAUZE/BANDAGES/DRESSINGS) ×2 IMPLANT
DRSG PAD ABDOMINAL 8X10 ST (GAUZE/BANDAGES/DRESSINGS) ×2 IMPLANT
DURAPREP 26ML APPLICATOR (WOUND CARE) ×2 IMPLANT
ELECT BLADE 4.0 EZ CLEAN MEGAD (MISCELLANEOUS)
ELECT CAUTERY BLADE 6.4 (BLADE) ×2 IMPLANT
ELECT REM PT RETURN 9FT ADLT (ELECTROSURGICAL) ×2
ELECTRODE BLDE 4.0 EZ CLN MEGD (MISCELLANEOUS) IMPLANT
ELECTRODE REM PT RTRN 9FT ADLT (ELECTROSURGICAL) ×1 IMPLANT
GAUZE XEROFORM 1X8 LF (GAUZE/BANDAGES/DRESSINGS) ×2 IMPLANT
GLOVE BIOGEL PI IND STRL 7.0 (GLOVE) ×1 IMPLANT
GLOVE BIOGEL PI IND STRL 8 (GLOVE) ×2 IMPLANT
GLOVE BIOGEL PI INDICATOR 7.0 (GLOVE) ×1
GLOVE BIOGEL PI INDICATOR 8 (GLOVE) ×2
GLOVE ECLIPSE 7.5 STRL STRAW (GLOVE) ×4 IMPLANT
GLOVE SURG SS PI 6.5 STRL IVOR (GLOVE) ×2 IMPLANT
GOWN STRL REUS W/ TWL LRG LVL3 (GOWN DISPOSABLE) ×2 IMPLANT
GOWN STRL REUS W/ TWL XL LVL3 (GOWN DISPOSABLE) ×2 IMPLANT
GOWN STRL REUS W/TWL LRG LVL3 (GOWN DISPOSABLE) ×2
GOWN STRL REUS W/TWL XL LVL3 (GOWN DISPOSABLE) ×2
HOOD PEEL AWAY FACE SHEILD DIS (HOOD) ×4 IMPLANT
HOOD PEEL AWAY FLYTE STAYCOOL (MISCELLANEOUS) ×2 IMPLANT
KIT BASIN OR (CUSTOM PROCEDURE TRAY) ×2 IMPLANT
KIT ROOM TURNOVER OR (KITS) ×2 IMPLANT
MANIFOLD NEPTUNE II (INSTRUMENTS) ×2 IMPLANT
NEEDLE 22X1 1/2 (OR ONLY) (NEEDLE) ×2 IMPLANT
NEEDLE SPNL 22GX3.5 QUINCKE BK (NEEDLE) IMPLANT
NS IRRIG 1000ML POUR BTL (IV SOLUTION) ×2 IMPLANT
PACK TOTAL JOINT (CUSTOM PROCEDURE TRAY) ×2 IMPLANT
PACK UNIVERSAL I (CUSTOM PROCEDURE TRAY) ×2 IMPLANT
PAD ARMBOARD 7.5X6 YLW CONV (MISCELLANEOUS) ×4 IMPLANT
RTRCTR WOUND ALEXIS 18CM MED (MISCELLANEOUS)
RTRCTR WOUND ALEXIS 18CM SML (INSTRUMENTS) ×2
SAVER CELL AAL HAEMONETICS (INSTRUMENTS) ×1 IMPLANT
SPONGE LAP 18X18 X RAY DECT (DISPOSABLE) IMPLANT
STAPLER VISISTAT 35W (STAPLE) ×4 IMPLANT
SUT ETHIBOND NAB CT1 #1 30IN (SUTURE) ×4 IMPLANT
SUT MNCRL AB 3-0 PS2 18 (SUTURE) IMPLANT
SUT VIC AB 0 CT1 27 (SUTURE) ×1
SUT VIC AB 0 CT1 27XBRD ANBCTR (SUTURE) ×1 IMPLANT
SUT VIC AB 1 CT1 27 (SUTURE) ×3
SUT VIC AB 1 CT1 27XBRD ANBCTR (SUTURE) ×3 IMPLANT
SUT VIC AB 2-0 CT1 27 (SUTURE) ×1
SUT VIC AB 2-0 CT1 TAPERPNT 27 (SUTURE) ×1 IMPLANT
SYR 50ML LL SCALE MARK (SYRINGE) IMPLANT
SYR CONTROL 10ML LL (SYRINGE) ×2 IMPLANT
TOWEL OR 17X24 6PK STRL BLUE (TOWEL DISPOSABLE) ×2 IMPLANT
TOWEL OR 17X26 10 PK STRL BLUE (TOWEL DISPOSABLE) ×2 IMPLANT
TRAY CATH 16FR W/PLASTIC CATH (SET/KITS/TRAYS/PACK) IMPLANT
TRAY FOLEY CATH 16FR SILVER (SET/KITS/TRAYS/PACK) IMPLANT
WATER STERILE IRR 1000ML POUR (IV SOLUTION) IMPLANT

## 2016-05-14 NOTE — Anesthesia Procedure Notes (Signed)
Procedure Name: Intubation Date/Time: 05/14/2016 4:09 PM Performed by: Lovie CholOCK, Zavier Canela K Pre-anesthesia Checklist: Patient identified, Emergency Drugs available, Suction available and Patient being monitored Patient Re-evaluated:Patient Re-evaluated prior to inductionOxygen Delivery Method: Circle System Utilized Preoxygenation: Pre-oxygenation with 100% oxygen Intubation Type: IV induction Ventilation: Mask ventilation without difficulty Laryngoscope Size: Miller and 2 Grade View: Grade I Tube type: Oral Tube size: 7.0 mm Number of attempts: 1 Airway Equipment and Method: Stylet and Oral airway Placement Confirmation: ETT inserted through vocal cords under direct vision,  positive ETCO2 and breath sounds checked- equal and bilateral Secured at: 21 cm Tube secured with: Tape Dental Injury: Teeth and Oropharynx as per pre-operative assessment

## 2016-05-14 NOTE — Brief Op Note (Signed)
05/13/2016 - 05/14/2016  5:51 PM  PATIENT:  Mackenzie [name]  70 y.o. female  PRE-OPERATIVE DIAGNOSIS:  Right Hip Fx  POST-OPERATIVE DIAGNOSIS:  Right Hip Fx  PROCEDURE:  Procedure(s): ANTERIOR APPROACH HEMI HIP ARTHROPLASTY (Right)  SURGEON:  Surgeon(s) and Role:    * Jodi GeraldsJohn Jaydien Panepinto, MD - Primary  PHYSICIAN ASSISTANT:   ASSISTANTS: bethune   ANESTHESIA:   general  EBL:  Total I/O In: 1450 [I.V.:1200; IV Piggyback:250] Out: 150 [Blood:150]  BLOOD ADMINISTERED:none  DRAINS: none   LOCAL MEDICATIONS USED:  MARCAINE     SPECIMEN:  No Specimen  DISPOSITION OF SPECIMEN:  N/A  COUNTS:  YES  TOURNIQUET:  * No tourniquets in log *  DICTATION: .Other Dictation: Dictation Number [id number]  PLAN OF CARE: Admit to inpatient   PATIENT DISPOSITION:  PACU - hemodynamically stable.   Delay start of Pharmacological VTE agent (>24hrs) due to surgical blood loss or risk of bleeding: no

## 2016-05-14 NOTE — Transfer of Care (Signed)
Immediate Anesthesia Transfer of Care Note  Patient: Mackenzie [name]  Procedure(s) Performed: Procedure(s): ANTERIOR APPROACH HEMI HIP ARTHROPLASTY (Right)  Patient Location: PACU  Anesthesia Type:General  Level of Consciousness: awake, alert  and oriented  Airway & Oxygen Therapy: Patient Spontanous Breathing and Patient connected to face mask oxygen  Post-op Assessment: Report given to RN, Post -op Vital signs reviewed and stable and Patient moving all extremities X 4  Post vital signs: Reviewed and stable  Last Vitals:  Filed Vitals:   05/14/16 0527 05/14/16 1421  BP: 129/83 150/80  Pulse: 86 88  Temp: 36.9 C 36.8 C  Resp: 15 16    Last Pain:  Filed Vitals:   05/14/16 1422  PainSc: 10-Worst pain ever      Patients Stated Pain Goal: 3 (05/14/16 1406)  Complications: No apparent anesthesia complications

## 2016-05-14 NOTE — Care Management Important Message (Signed)
Important Message  Patient Details  Name: Mackenzie [name] MRN: [id number] Date of Birth: 12/22/1945   Medicare Important Message Given:  Yes    Bernadette HoitShoffner, Anhelica Fowers Coleman 05/14/2016, 8:24 AM

## 2016-05-14 NOTE — Discharge Instructions (Signed)

## 2016-05-14 NOTE — Op Note (Signed)
NAMESTESHA, Mackenzie [name]               ACCOUNT NO.:  [id number]  MEDICAL RECORD NO.:  [id number]  LOCATION:  5N29C                        FACILITY:  [facility]  PHYSICIAN:  Harvie Junior, M.D.   DATE OF BIRTH:  20-Sep-1946  DATE OF PROCEDURE:  05/14/2016 DATE OF DISCHARGE:                              OPERATIVE REPORT   PREOPERATIVE DIAGNOSIS:  Displaced femoral neck fracture, right.  POSTOPERATIVE DIAGNOSIS:  Displaced femoral neck fracture, right.  PROCEDURE: 1. Right hemiarthroplasty with a Corail size 14 stem and a 50 mm     monopolar ball with a -3 neck extension. 2. Interpretation of multiple intraoperative fluoroscopic images.  ASSISTANT:  Mackenzie Ly, PA.  ANESTHESIA:  General.  BRIEF HISTORY:  Mrs. Mackenzie [name] is a 70 year old female with a long history of significant complaints of right hip pain after a fall.  X-rays showed displaced femoral neck fracture.  I have done hemiarthroplasty bipolar cemented on the other side approximately 7 years ago.  She had done reasonably well up until this time and presented with this fracture. She was brought to the operating room for hemiarthroplasty.  DESCRIPTION OF PROCEDURE:  The patient was brought to the operative room.  After adequate anesthesia was obtained with general anesthetic, the patient was placed supine on the operating table.  She was then moved onto the Hana bed and she was prepped and draped in usual sterile fashion.  Following this, attention was turned towards the right hip where after routine prep and drape, an incision was made for an anterior approach to the hip and subcutaneous tissue down the level of the tensor fascia.  Tensor fascia was identified and a small rent was made in the fascia, finger fracture over the muscle.  Retractors put in place above and below the neck and flaps were raised off the anterior capsule and tagged.  Following this, a provisional neck cut was made.  Following this, the head was removed  and measured on the back table to a 50.  We tried a 50 in the socket.  It had a good suction fit.  We tried a 49. It seemed like it was too loosen there.  She did have a bit of a shallow acetabulum, but this was the appropriate size.  Once this was completed, attention was turned towards returning to the stem side.  We sequentially rasped her up to a level of 14.  I did a calcar planer.  I did a trial with a 36 ball.  Did some math and felt that a -3 with a 50 would be appropriate and this was put in place.  We did leave the Corail a couple of millimeters prox.  We just could not get it any farther down and that we knew it was going to affect her leg length but not dramatically.  So, at that point, we accepted it, put it in a -3 with a 50, and reduced it, and took our x-rays.  Stem looked great, but was probably 3 mm long.  At this point, we accepted it, irrigated the hip thoroughly, closed the capsule with interrupted Vicryl, and closed the tensor fascia with 0 Vicryl running, skin with 0 and  2-0 Vicryl, 3-0, and skin staples.  Sterile compressive dressing was applied.  The patient was taken to the recovery room.  She was noted to be in satisfactory condition.  Estimated blood loss for procedure was less than 500 mL.  Throughout the case, fluoro was used to make sure that we had adequate fit and fill of the stem and the leg length.  The patient, at this point, was taken to the recovery room after sterile compressive dressing was applied where she was noted to be in satisfactory condition. Estimated blood loss for procedure was 500 mL, but the actual number should be gotten from the anesthetic record.     Harvie JuniorJohn L. Charlett [name], M.D.     Mackenzie PlumberJLG/MEDQ  D:  05/14/2016  T:  05/14/2016  Job:  [id number]

## 2016-05-14 NOTE — Progress Notes (Signed)
Initial Nutrition Assessment  DOCUMENTATION CODES:    Severe malnutrition in context of chronic illness  INTERVENTION:  Once diet advances, provide Ensure Enlive po BID, each supplement provides 350 kcal and 20 grams of protein.  Recommend obtaining new weight to fully assess weight trends.  NUTRITION DIAGNOSIS:   Malnutrition related to chronic illness as evidenced by severe depletion of body fat, severe depletion of muscle mass.  GOAL:   Patient will meet greater than or equal to 90% of their needs  MONITOR:   Supplement acceptance, Weight trends, Labs, I & O's, Skin  REASON FOR ASSESSMENT:   Consult Assessment of nutrition requirement/status  ASSESSMENT:   70 y.o. female with medical history significant of arthritis, Vit D deficiency, depression and recent ICH s/p fall. Who was Discharged on 05/10/16 after admission for subarachnoid hemorrhage after fall and syncopal episode (suggested to be associated with dehydration). Since discharge pt has been feeling generally well, though endorses persistent generalized mild weakness and gate instability. Overnight patient endorses falling off of chair and striking the floor. Immediate right hip pain. X-ray in ED demonstrated right hip fracture.  Pt is currently NPO for surgery today. Pt reports eating well PTA with usual consumption of at least 3 meals a day with Ensure shakes at least twice daily. Usual body weight reported to be ~101-105 lbs. Per Epic weight records, pt with a 8% weight loss in 4 months. Pt currently has Ensure ordered. Nursing staff to provide once diet advances.   Nutrition-Focused physical exam completed. Findings are severe fat depletion, moderate to severe muscle depletion, and mild edema.   Labs and medications reviewed.   Diet Order:  Diet NPO time specified  Skin:  Wound (see comment) (Stage I on buttocks and back, stage II on R hip)  Last BM:  7/12  Height:   Ht Readings from Last 1 Encounters:   05/04/16 5\' 8"  (1.727 m)    Weight:   Wt Readings from Last 1 Encounters:  05/04/16 101 lb 13.6 oz (46.2 kg)    Ideal Body Weight:  63.6 kg  BMI:  There is no weight on file to calculate BMI.  Estimated Nutritional Needs:   Kcal:  1600-1800  Protein:  75-85 grams  Fluid:  1.6 - 1.8 L/day  EDUCATION NEEDS:   No education needs identified at this time  Roslyn SmilingStephanie Danashia Landers, MS, RD, LDN Pager # [phone] After hours/ weekend pager # [phone]

## 2016-05-14 NOTE — Progress Notes (Signed)
Upper denture plate ret to pt & put in mouth.

## 2016-05-14 NOTE — Progress Notes (Signed)
Subjective: Persistent r hip pain    Objective: Vital signs in last 24 hours: Temp:  [98.3 F (36.8 C)-98.7 F (37.1 C)] 98.3 F (36.8 C) (07/13 1421) Pulse Rate:  [68-88] 88 (07/13 1421) Resp:  [15-16] 16 (07/13 1421) BP: (116-158)/(61-96) 150/80 mmHg (07/13 1421) SpO2:  [90 %-98 %] 92 % (07/13 1421)  Intake/Output from previous day:   Intake/Output this shift:     Recent Labs  05/13/16 0820 05/14/16 0513  HGB 10.2* 9.7*    Recent Labs  05/13/16 0820 05/13/16 1418 05/14/16 0513  WBC 9.8  --  8.6  RBC 2.79* 2.64* 2.56*  HCT 29.8*  --  28.1*  PLT 330  --  351    Recent Labs  05/13/16 1919 05/14/16 0848  NA 131* 134*  K 3.1* 3.7  CL 100* 100*  CO2 26 29  BUN 6 6  CREATININE 0.37* 0.35*  GLUCOSE 80 100*  CALCIUM 7.5* 8.1*    Recent Labs  05/13/16 0820  INR 1.20    Neurologically intact ABD soft Neurovascular intact Sensation intact distally Intact pulses distally No cellulitis present Compartment soft  Assessment/Plan: 70 yo female with r fem neck fx//Plan hemi hip    Tationa Stech L 05/14/2016, 3:40 PM

## 2016-05-14 NOTE — Progress Notes (Signed)
[facility] PROGRESS NOTE  Mackenzie SinghMartha F [name] ZOX:[id number] DOB: Jun 11, 1946 DOA: 05/13/2016 PCP: Mackenzie [name],Mackenzie [name], Mackenzie [name]  Interim summary and HPI 70 [name]. female with medical history significant of arthritis, Vit D deficiency, depression and recent ICH s/p fall.  Who was Discharged on 05/10/16 after admission for subarachnoid hemorrhage after fall and syncopal episode (suggested to be associated with dehydration). Since discharge pt has been feeling generally well, though endorses persistent generalized mild weakness and gate instability. Overnight patient endorses falling off of chair and striking the floor. Immediate right hip pain. Denies any LOC, head trauma, neck stiffness, fevers, cough and/or shortness of breath. She reports some vague  Dysuria and frequency.  Persistent R hip pain. Percocet w/ some benefit. Worse w/ movement. Improves w/ rest. X-ray in ED demonstrated right hip fracture.  Assessment/Plan: R femoral neck fracture: Mackenzie [name] consulted by ED and to evaluate pt. H/o L hip replacement.  - f/u [facility] recs - plan is for surgery later today 7/13 - NPO -PRN analgesics -PT/OT after surgery  -SCD's for prophylaxis given recent ICH  Subaracnoid hemorrhage. Pt syncopized on 7/2 and struck her head sustaining ICH. Discharged on 7/9 after bleed stabilized. CT on day of admission shows improvement in condition. Mackenzie [name] Discussed new CT results w/ on call neurosurgery Mackenzie Mackenzie [name] who states that there is no further workup/mgt needed for her condition.  - Further imaging if develops any worriesome neurologic symptoms changes  Hyponatremia: 129 on admission. 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  -sodium improved with IVF's -current level 134 -will monitor trend   HypoK: 2.8 -will Replete as needed -follow electrolytes trend  Hyperglyecmia: 121. Suspect from acute stress. Last A1c 4.8 - trend w/ BMET   Physical deconditioning/protein calorie  malnutrition: low total protein and albumin. Frail and cachectic appearing. Multiple mechanical falls recently. Poor nutritional intake since ICH.  -Nutrition service consulted -will follow rec's from pt/ot -feeding supplements   Depression/insomnia: -Suspect depression and emotional lability after ICH and readmission which is somewhat expected.  -no SI or hallucinations -will continue trazodone QHS  Anemia: Hgb 10.2. Near baseline. AOCD as per anemia panel -B12 1223 and normal folate -Follow Hgb trend  E.coli UTI -will treat with ceftin -recent urine culture positive for E. coli  Code Status: Full Family Communication: no family at bedside  Disposition Plan: to be determine after hip surgery; remains inpatient. Continue PRN pain medications    Consultants:  Orthopedic service.  Procedures:  See below for x-ray reports   Antibiotics:  ceftin 7/13  HPI/Subjective: Afebrile, no CP, no SOB, no nausea or vomiting. Complaining or RLE pain with any movement attempt   Objective: Filed Vitals:   05/13/16 2058 05/14/16 0527  BP: 145/82 129/83  Pulse: 81 86  Temp: 98.6 F (37 C) 98.4 F (36.9 C)  Resp: 16 15   No intake or output data in the 24 hours ending 05/14/16 0931 There were no vitals filed for this visit.  Exam:   General:  Afebrile, denies CP and SOB. Patient endorses mild dysuria and also some increase frequency prior to admission. No HA's, AAOX3.  Cardiovascular: S1 and S2, no rubs or gallops  Respiratory: CTA bilaterally   Abdomen: soft, NT, ND, positive BS  Musculoskeletal: no edema, no cyanosis; RLE externally rotated and tender to palpation.  Skin: with some face bruises from falls; no rashes or open wounds.   Data Reviewed: Basic Metabolic Panel:  Recent Labs Lab 05/13/16 0820 05/13/16 1418 05/13/16  1919 05/14/16 0848  NA 129*  --  131* 134*  K 2.8*  --  3.1* 3.7  CL 89*  --  100* 100*  CO2 33*  --  26 29  GLUCOSE 121*  --  80  100*  BUN 8  --  6 6  CREATININE 0.42*  --  0.37* 0.35*  CALCIUM 8.2*  --  7.5* 8.1*  MG  --  1.7  --   --   PHOS  --  2.6  --   --    CBC:  Recent Labs Lab 05/13/16 0820 05/14/16 0513  WBC 9.8 8.6  NEUTROABS 8.2*  --   HGB 10.2* 9.7*  HCT 29.8* 28.1*  MCV 106.8* 109.8*  PLT 330 351   CBG: No results for input(s): GLUCAP in the last 168 hours.  Recent Results (from the past 240 hour(s))  MRSA PCR Screening     Status: None   Collection Time: 05/04/16  6:43 PM  Result Value Ref Range Status   MRSA by PCR NEGATIVE NEGATIVE Final    Comment:        The GeneXpert MRSA Assay (FDA approved for NASAL specimens only), is one component of a comprehensive MRSA colonization surveillance program. It is not intended to diagnose MRSA infection nor to guide or monitor treatment for MRSA infections.   Culture, Urine     Status: Abnormal   Collection Time: 05/09/16  5:29 PM  Result Value Ref Range Status   Specimen Description URINE, CLEAN CATCH  Final   Special Requests NONE  Final   Culture >=100,000 COLONIES/mL ESCHERICHIA COLI (A)  Final   Report Status 05/11/2016 FINAL  Final   Organism ID, Bacteria ESCHERICHIA COLI (A)  Final      Susceptibility   Escherichia coli - MIC*    AMPICILLIN <=2 SENSITIVE Sensitive     CEFAZOLIN <=4 SENSITIVE Sensitive     CEFTRIAXONE <=1 SENSITIVE Sensitive     CIPROFLOXACIN <=0.25 SENSITIVE Sensitive     GENTAMICIN <=1 SENSITIVE Sensitive     IMIPENEM <=0.25 SENSITIVE Sensitive     NITROFURANTOIN <=16 SENSITIVE Sensitive     TRIMETH/SULFA <=20 SENSITIVE Sensitive     AMPICILLIN/SULBACTAM <=2 SENSITIVE Sensitive     PIP/TAZO <=4 SENSITIVE Sensitive     * >=100,000 COLONIES/mL ESCHERICHIA COLI     Studies: Ct Head Wo Contrast  05/13/2016  CLINICAL DATA:  Follow-up intracranial hemorrhage post fall on 05/04/2016, discharge on 05/11/2016, fell again last night out of a chair, bruises on face EXAM: CT HEAD WITHOUT CONTRAST TECHNIQUE:  Contiguous axial images were obtained from the base of the skull through the vertex without intravenous contrast. COMPARISON:  05/08/2016 FINDINGS: Generalized atrophy. Stable ventricular morphology without hydrocephalus. Large intra cerebral hematoma again identified in LEFT frontal lobe with surrounding vasogenic edema, high attenuation acute blood component appearing slightly smaller and less well defined. Minimal LEFT-to-RIGHT midline shift again seen approximately 3 mm. Mild persistent effacement of sulci in LEFT frontal lobe. No new areas of intracranial hemorrhage, mass lesion or acute infarction. No extra-axial fluid collections. Atherosclerotic calcifications at the carotid siphons. Persistent air-fluid level RIGHT sphenoid sinus. Paranasal sinuses and mastoid air cells otherwise clear. Bones demineralized but intact. IMPRESSION: Large intra cerebral hematoma identified previously in LEFT frontal lobe appears minimally smaller and less well defined though persistent surrounding vasogenic edema and 3 mm of LEFT-to-RIGHT midline shift remain. No new intracranial abnormalities. Persistent air-fluid level sphenoid sinus. Electronically Signed   By: Ulyses Southward  M.D.   On: 05/13/2016 10:01   Dg Hips Bilat With Pelvis 3-4 Views  05/13/2016  CLINICAL DATA:  Pain following fall EXAM: DG HIP (WITH OR WITHOUT PELVIS) 3-4V BILAT COMPARISON:  May 04, 2016 FINDINGS: Images of frontal pelvis as well as frontal and lateral hips bilaterally obtained. There is a subcapital femoral neck fracture on the right with varus angulation of the fracture site and slight superior migration of the right femoral shaft with respect to the femoral head. There is a prior fracture of the right ischium in near anatomic alignment. 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 Bones are osteoporotic. No dislocations. IMPRESSION: Subcapital femoral neck fracture on the right with  varus angulation at the fracture site. 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 Fracture of the right ischium in near anatomic alignment. Bones osteoporotic. Electronically Signed   By: Bretta Bang III M.D.   On: 05/13/2016 09:03    Scheduled Meds: . sodium chloride   Intravenous STAT  .  ceFAZolin (ANCEF) IV  2 g Intravenous To SS-Surg  . fentaNYL  12.5 mcg Transdermal Q72H  . povidone-iodine  2 application Topical Once   Continuous Infusions: . sodium chloride 100 mL/hr at 05/14/16 1610    Active Problems:   Insomnia   Subarachnoid bleed (HCC)   Protein-calorie malnutrition, severe   Hip fracture (HCC)   Physical deconditioning   Hyponatremia   Hypokalemia   Hyperglycemia   Macrocytic anemia    Time spent: 30 minutes    Vassie Loll  [facility] Pager [phone]. If 7PM-7AM, please contact night-coverage at [email or web address], password [facility] 05/14/2016, 9:31 AM  LOS: 1 day

## 2016-05-14 NOTE — Progress Notes (Signed)
Rept taken from Mackenzie MantleJoshua Draper RN. Pt resting SF in bed. No s/sx of distress. Pt is NPO but per Margaretha Sheffieldraper RN rept-pt forgot and had a few small bites of breakfast and he called anesthesia and made them aware. Per his rept, no new orders-plan is to proceed with surgery this PM.  All PO foods/fluids removed from bedside and pt made NPO in the computer. Pt instructed that she needs to be NPO for surgery this PM. Pt verb understanding and states, "I know. I forgot." Will continue to monitor.

## 2016-05-14 NOTE — Progress Notes (Signed)
Despite being advised to not eat or drink [name] was consuming breakfast, she only had 1 bite of eggs and half a cup of orange juice. Anesthesia (Dr. Sampson GoonFitzgerald) advised of situation. Surgery scheduled at 4pm. Last consumption 0740 AM.  Patient again advised to not eat or drink. NPO sign remains on door

## 2016-05-14 NOTE — Progress Notes (Signed)
Pt transferred via hospital bed to preop holding area. [facility] in preop holding area. Rept given that pt was cleaned up prior to transfer due to large incontinent episode. Glasses left at bedside. Pt states she has no dentures and no jewelry or clothes in place except gown. All rept given that per pt she ate one bite of bagel, one tbsp of egg, one bite of home fries and one swallow of OJ at breakfast this AM approx 0730. Rept given that per Ambrose MantleJoshua Draper RN, he called anesthesia regarding this food/fluid and no new orders given and per his rept plan was to proceed with sugery. Pt was otherwise NPO. No change in pt from AM assessment at 1130. VS remain stable. Fentanyl patch removed from R shoulder and wasted in sharps container prior to transport to preop area. Carolann LittlerKatelin Sawicki RN witnessed this Management consultantN waste patch in sharps.

## 2016-05-14 NOTE — Anesthesia Preprocedure Evaluation (Addendum)
Anesthesia Evaluation  Patient identified by MRN, date of birth, ID band Patient awake    Reviewed: Allergy & Precautions, NPO status , Patient's Chart, lab work & pertinent test results  History of Anesthesia Complications Negative for: history of anesthetic complications  Airway Mallampati: II  TM Distance: >3 FB Neck ROM: Full    Dental  (+) Upper Dentures, Dental Advisory Given   Pulmonary former smoker,    Pulmonary exam normal        Cardiovascular negative cardio ROS Normal cardiovascular exam     Neuro/Psych Depression negative neurological ROS     GI/Hepatic negative GI ROS, Neg liver ROS,   Endo/Other  negative endocrine ROS  Renal/GU negative Renal ROS     Musculoskeletal  (+) Arthritis ,   Abdominal   Peds  Hematology  (+) anemia ,   Anesthesia Other Findings   Reproductive/Obstetrics                           Anesthesia Physical Anesthesia Plan  ASA: III  Anesthesia Plan: General   Post-op Pain Management:    Induction: Intravenous  Airway Management Planned: Oral ETT  Additional Equipment:   Intra-op Plan:   Post-operative Plan: Extubation in OR  Informed Consent: I have reviewed the patients History and Physical, chart, labs and discussed the procedure including the risks, benefits and alternatives for the proposed anesthesia with the patient or authorized representative who has indicated his/her understanding and acceptance.   Dental advisory given  Plan Discussed with: CRNA  Anesthesia Plan Comments:         Anesthesia Quick Evaluation

## 2016-05-14 NOTE — Progress Notes (Signed)
Pt has no pressure skin issues. Pt noted to have a purple bruise under L eye. Pt repts bruise is from one of her falls. Pt also noted to have a scabbed area of posterior R elbow. Pt has a mepilex to an ?abrasion? Area of R posterior buttock that has small amount of dried yellow drainage.  Due to pt refuses to turn/tilt or float heels, pt has pink protocol sticker to buttock and to bil heels. Will continue to monitor.

## 2016-05-15 ENCOUNTER — Encounter (HOSPITAL_COMMUNITY): Payer: Self-pay | Admitting: Orthopedic Surgery

## 2016-05-15 DIAGNOSIS — S72001D Fracture of unspecified part of neck of right femur, subsequent encounter for closed fracture with routine healing: Secondary | ICD-10-CM

## 2016-05-15 DIAGNOSIS — D62 Acute posthemorrhagic anemia: Secondary | ICD-10-CM

## 2016-05-15 LAB — CBC
HCT: 26.4 % — ABNORMAL LOW (ref 36.0–46.0)
Hemoglobin: 8.9 g/dL — ABNORMAL LOW (ref 12.0–15.0)
MCH: 37.2 pg — AB (ref 26.0–34.0)
MCHC: 33.7 g/dL (ref 30.0–36.0)
MCV: 110.5 fL — AB (ref 78.0–100.0)
PLATELETS: 370 10*3/uL (ref 150–400)
RBC: 2.39 MIL/uL — AB (ref 3.87–5.11)
RDW: 13.2 % (ref 11.5–15.5)
WBC: 10.7 10*3/uL — ABNORMAL HIGH (ref 4.0–10.5)

## 2016-05-15 MED ORDER — POLYSACCHARIDE IRON COMPLEX 150 MG PO CAPS
150.0000 mg | ORAL_CAPSULE | Freq: Two times a day (BID) | ORAL | Status: DC
  Administered 2016-05-15 – 2016-05-17 (×4): 150 mg via ORAL
  Filled 2016-05-15 (×5): qty 1

## 2016-05-15 NOTE — Care Management Note (Signed)
Case Management Note  Patient Details  Name: Mackenzie SinghMartha F [name] MRN: [id number] Date of Birth: 02-23-1946  Subjective/Objective:        CM following for progression and d/c planning.             Action/Plan: 05/15/2016 Pt for d/c to SNF for short term rehab. CSW following.   Expected Discharge Date:                  Expected Discharge Plan:  Skilled Nursing Facility  In-House Referral:  Clinical Social Work  Discharge planning Services  NA  Post Acute Care Choice:  NA Choice offered to:  NA  DME Arranged:    DME Agency:     HH Arranged:  NA HH Agency:  NA  Status of Service:  Completed, signed off  If discussed at Long Length of Stay Meetings, dates discussed:    Additional Comments:  Starlyn SkeansRoyal, Chandrea Zellman U, RN 05/15/2016, 3:53 PM

## 2016-05-15 NOTE — Progress Notes (Signed)
[facility] PROGRESS NOTE  Mackenzie SinghMartha F [name] ZOX:[id number] DOB: 07-22-1946 DOA: 05/13/2016 PCP: Leanor RubensteinSUN,Mackenzie Y, MD  Interim summary and HPI 70 [name]. female with medical history significant of arthritis, Vit D deficiency, depression and recent ICH s/p fall.  Who was Discharged on 05/10/16 after admission for subarachnoid hemorrhage after fall and syncopal episode (suggested to be associated with dehydration). Since discharge pt has been feeling generally well, though endorses persistent generalized mild weakness and gate instability. Overnight patient endorses falling off of chair and striking the floor. Immediate right hip pain. Denies any LOC, head trauma, neck stiffness, fevers, cough and/or shortness of breath. She reports some vague  Dysuria and frequency.  Persistent R hip pain. Percocet w/ some benefit. Worse w/ movement. Improves w/ rest. X-ray in ED demonstrated right hip fracture.  Assessment/Plan: R femoral neck fracture: Dr [name] consulted by ED and to evaluate pt. H/o L hip replacement.  -f/u [facility] recs -S/p hemi arthroplasty on 7/13 -continue PRN analgesics -PT/OT after surgery  -SCD's for prophylaxis given recent ICH (will discuss with neurosurgery regarding safety using ASA for prophylaxis)  Subaracnoid hemorrhage. Pt syncopized on 7/2 and struck her head sustaining ICH. Discharged on 7/9 after bleed stabilized. CT on day of admission shows improvement in condition. Dr. [name] Discussed new CT results w/ on call neurosurgery Dr [name] who states that there is no further workup/mgt needed for her condition.  - Further imaging if develops any worriesome neurologic symptoms changes  Hyponatremia: 129 on admission. 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  -sodium improved with IVF's -current level 134 -will monitor trend   HypoK: 2.8 on admission  -will Replete as needed -follow electrolytes trend  Hyperglyecmia: 121. Suspect from acute  stress. Last A1c 4.8 -will follow trend -no prior hx of diabetes  Physical deconditioning/protein calorie malnutrition: low total protein and albumin. Frail and cachectic appearing. Multiple mechanical falls recently. Poor nutritional intake since ICH.  -Nutrition service consulted -will follow rec's from pt/ot -feeding supplements   Depression/insomnia: -Suspect depression and emotional lability after ICH and readmission which is somewhat expected.  -no SI or hallucinations -will continue trazodone QHS  Anemia of chronic disease with acute blood loss component: Hgb 10.2 at baseline. Now with acute blood loss anemia -B12 1223 and normal folate -Follow up Hgb 8.9 after surgery -started on niferex BID -no need for transfusion currently   E.coli UTI -will continue treatment with ceftin -recent urine culture positive for E. coli  Code Status: Full Family Communication: no family at bedside  Disposition Plan: to be determine after hip surgery; remains inpatient. Continue PRN pain medications    Consultants:  Orthopedic service.  Procedures:  See below for x-ray reports   Hemi-arthroplasty 7/13  Antibiotics:  ceftin 7/13  HPI/Subjective: Afebrile, no CP, no SOB, no nausea or vomiting. Complaining or RLE pain (moderate)    Objective: Filed Vitals:   05/15/16 0639 05/15/16 1434  BP: 153/88 144/81  Pulse: 81 82  Temp: 98.2 F (36.8 C) 98.7 F (37.1 C)  Resp: 16 18    Intake/Output Summary (Last 24 hours) at 05/15/16 1700 Last data filed at 05/15/16 0900  Gross per 24 hour  Intake   2160 ml  Output   1400 ml  Net    760 ml   There were no vitals filed for this visit.  Exam:   General:  Afebrile, denies CP and SOB. Patient denies dysuria currently. Reports pain is moderate. No HA's, AAOX3.  Cardiovascular: S1  and S2, no rubs or gallops  Respiratory: CTA bilaterally   Abdomen: soft, NT, ND, positive BS  Musculoskeletal: no edema, no cyanosis; RLE  with intact dressings. Patient was sitting on the chair having lunch  Skin: with some face bruises from falls; no rashes or open wounds.   Data Reviewed: Basic Metabolic Panel:  Recent Labs Lab 05/13/16 0820 05/13/16 1418 05/13/16 1919 05/14/16 0848  NA 129*  --  131* 134*  K 2.8*  --  3.1* 3.7  CL 89*  --  100* 100*  CO2 33*  --  26 29  GLUCOSE 121*  --  80 100*  BUN 8  --  6 6  CREATININE 0.42*  --  0.37* 0.35*  CALCIUM 8.2*  --  7.5* 8.1*  MG  --  1.7  --   --   PHOS  --  2.6  --   --    CBC:  Recent Labs Lab 05/13/16 0820 05/14/16 0513 05/15/16 0337  WBC 9.8 8.6 10.7*  NEUTROABS 8.2*  --   --   HGB 10.2* 9.7* 8.9*  HCT 29.8* 28.1* 26.4*  MCV 106.8* 109.8* 110.5*  PLT 330 351 370   CBG: No results for input(s): GLUCAP in the last 168 hours.  Recent Results (from the past 240 hour(s))  Culture, Urine     Status: Abnormal   Collection Time: 05/09/16  5:29 PM  Result Value Ref Range Status   Specimen Description URINE, CLEAN CATCH  Final   Special Requests NONE  Final   Culture >=100,000 COLONIES/mL ESCHERICHIA COLI (A)  Final   Report Status 05/11/2016 FINAL  Final   Organism ID, Bacteria ESCHERICHIA COLI (A)  Final      Susceptibility   Escherichia coli - MIC*    AMPICILLIN <=2 SENSITIVE Sensitive     CEFAZOLIN <=4 SENSITIVE Sensitive     CEFTRIAXONE <=1 SENSITIVE Sensitive     CIPROFLOXACIN <=0.25 SENSITIVE Sensitive     GENTAMICIN <=1 SENSITIVE Sensitive     IMIPENEM <=0.25 SENSITIVE Sensitive     NITROFURANTOIN <=16 SENSITIVE Sensitive     TRIMETH/SULFA <=20 SENSITIVE Sensitive     AMPICILLIN/SULBACTAM <=2 SENSITIVE Sensitive     PIP/TAZO <=4 SENSITIVE Sensitive     * >=100,000 COLONIES/mL ESCHERICHIA COLI     Studies: Dg Hip Operative Unilat W Or W/o Pelvis Right  05/14/2016  CLINICAL DATA:  Right hip hemiarthroplasty performed to treat a subcapital right femoral neck fracture. EXAM: OPERATIVE RIGHT HIP (WITH PELVIS IF PERFORMED) 1 VIEW  TECHNIQUE: Fluoroscopic spot image(s) were submitted for interpretation post-operatively. COMPARISON:  Right hip x-rays yesterday. FINDINGS: A single AP spot image demonstrates anatomic alignment of the right femoral prosthesis relative to the acetabulum. IMPRESSION: Anatomic alignment in the AP projection post right hip hemiarthroplasty. Electronically Signed   By: Hulan Saas M.D.   On: 05/14/2016 17:53    Scheduled Meds: . feeding supplement (ENSURE ENLIVE)  237 mL Oral BID BM  . fentaNYL  12.5 mcg Transdermal Q72H  . iron polysaccharides  150 mg Oral BID  . tranexamic acid  (ORTHO-IV)  1,000 mg Intravenous To OR  . traZODone  25 mg Oral QHS   Continuous Infusions: . sodium chloride 100 mL/hr at 05/15/16 1504    Active Problems:   Insomnia   Subarachnoid bleed (HCC)   Protein-calorie malnutrition, severe   Hip fracture (HCC)   Physical deconditioning   Hyponatremia   Hypokalemia   Hyperglycemia   Macrocytic anemia  Time spent: 30 minutes    Vassie Loll  [facility] Pager [phone]. If 7PM-7AM, please contact night-coverage at [email or web address], password [facility] 05/15/2016, 5:00 PM  LOS: 2 days

## 2016-05-15 NOTE — Evaluation (Signed)
Physical Therapy Evaluation Patient Details Name: Mackenzie [name] MRN: [id number] DOB: [birth year] Today's Date: 05/15/2016   History of Present Illness  Patient is a 70 yo female admitted 05/13/16 after a fall with Rt hip fracture.  Patient s/p Rt hemiarthroplasty - anterior approach with no hip precautions.   PMH:  arthritis, ICH after fall.    Clinical Impression  Patient presents with problems listed below.  Will benefit from acute PT to maximize functional independence prior to discharge home with roommate.  Patient will need to function at min guard assist prior to d/c home.  If unable to reach that level, may need to consider SNF.   Pain limiting mobility/progress today.    Follow Up Recommendations Home health PT;Supervision/Assistance - 24 hour    Equipment Recommendations  None recommended by PT    Recommendations for Other Services       Precautions / Restrictions Precautions Precautions: Fall Restrictions Weight Bearing Restrictions: Yes RLE Weight Bearing: Weight bearing as tolerated      Mobility  Bed Mobility Overal bed mobility: Needs Assistance Bed Mobility: Supine to Sit     Supine to sit: Min assist;+2 for physical assistance     General bed mobility comments: Verbal cues for safe technique.  Assist to manage RLE and to raise trunk to sitting position.  Transfers Overall transfer level: Needs assistance Equipment used: Rolling walker (2 wheeled) Transfers: Sit to/from [employer]GI CorporationStand;Stand Pivot Transfers Sit to Stand: Mod assist;+2 safety/equipment Stand pivot transfers: Min assist;+2 safety/equipment       General transfer comment: Verbal cues for hand placement and technique.  Assist to rise to standing from bed.  Patient able to take several shuffle steps to chair.  Ambulation/Gait             General Gait Details: Patient declined  Stairs            Wheelchair Mobility    Modified Rankin (Stroke Patients Only)       Balance           Standing balance support: Bilateral upper extremity supported;During functional activity Standing balance-Leahy Scale: Poor                               Pertinent Vitals/Pain Pain Assessment: 0-10 Pain Score: 10-Worst pain ever Pain Location: Rt hip Pain Descriptors / Indicators: Aching;Sore Pain Intervention(s): Limited activity within patient's tolerance;Monitored during session;Repositioned;Patient requesting pain meds-RN notified    Home Living Family/patient expects to be discharged to:: Private residence Living Arrangements: Non-relatives/Friends Available Help at Discharge: Friend(s);Available 24 hours/day Type of Home: Apartment Home Access: Level entry     Home Layout: Able to live on main level with bedroom/bathroom Home Equipment: Walker - 2 wheels;Bedside commode;Cane - single point;Shower seat;Wheelchair - manual      Prior Function Level of Independence: Needs assistance   Gait / Transfers Assistance Needed: Patient using RW pta           Hand Dominance   Dominant Hand: Right    Extremity/Trunk Assessment   Upper Extremity Assessment: Generalized weakness           Lower Extremity Assessment: Generalized weakness;RLE deficits/detail RLE Deficits / Details: Decreased strength/ROM due to pain/surgery    Cervical / Trunk Assessment: Kyphotic  Communication   Communication: HOH  Cognition Arousal/Alertness: Awake/alert Behavior During Therapy: Anxious;Flat affect Overall Cognitive Status: No family/caregiver present to determine baseline cognitive functioning Area of Impairment: Problem solving  Problem Solving: Slow processing;Decreased initiation      General Comments      Exercises        Assessment/Plan    PT Assessment Patient needs continued PT services  PT Diagnosis Difficulty walking;Generalized weakness;Acute pain;Altered mental status   PT Problem List Decreased strength;Decreased  activity tolerance;Decreased balance;Decreased mobility;Decreased cognition;Decreased knowledge of use of DME;Decreased safety awareness;Pain  PT Treatment Interventions DME instruction;Gait training;Functional mobility training;Therapeutic activities;Therapeutic exercise;Cognitive remediation;Patient/family education   PT Goals (Current goals can be found in the Care Plan section) Acute Rehab PT Goals Patient Stated Goal: To go home PT Goal Formulation: With patient Time For Goal Achievement: 05/22/16 Potential to Achieve Goals: Good    Frequency 7X/week   Barriers to discharge        Co-evaluation               End of Session Equipment Utilized During Treatment: Gait belt Activity Tolerance: Patient limited by pain Patient left: in chair;with call bell/phone within reach;with chair alarm set Nurse Communication: Mobility status         Time: 0454-0981 PT Time Calculation (min) (ACUTE ONLY): 15 min   Charges:   PT Evaluation $PT Eval Moderate Complexity: 1 Procedure     PT G CodesVena Austria 05-23-2016, 1:50 PM Durenda Hurt. Renaldo Fiddler, [facility] Acute Rehab Services Pager [phone]

## 2016-05-15 NOTE — Progress Notes (Signed)
Subjective: 1 Day Post-Op Procedure(s) (LRB): ANTERIOR APPROACH HEMI HIP ARTHROPLASTY (Right) Patient reports pain as moderate.  Patient sitting up in chair. No dizziness. Complains of moderate right hip pain when she moves. Reports that she wants to go home soon. She has a friend at home who will help care for her.  Objective: Vital signs in last 24 hours: Temp:  [97.2 F (36.2 C)-98.5 F (36.9 C)] 98.2 F (36.8 C) (07/14 0639) Pulse Rate:  [81-101] 81 (07/14 0639) Resp:  [14-18] 16 (07/14 0639) BP: (140-160)/(76-88) 153/88 mmHg (07/14 0639) SpO2:  [92 %-100 %] 94 % (07/14 0639)  Intake/Output from previous day: 07/13 0701 - 07/14 0700 In: 3620 [P.O.:120; I.V.:3050; IV Piggyback:450] Out: 1400 [Urine:1250; Blood:150] Intake/Output this shift: Total I/O In: 240 [P.O.:240] Out: -    Recent Labs  05/13/16 0820 05/14/16 0513 05/15/16 0337  HGB 10.2* 9.7* 8.9*    Recent Labs  05/14/16 0513 05/15/16 0337  WBC 8.6 10.7*  RBC 2.56* 2.39*  HCT 28.1* 26.4*  PLT 351 370    Recent Labs  05/13/16 1919 05/14/16 0848  NA 131* 134*  K 3.1* 3.7  CL 100* 100*  CO2 26 29  BUN 6 6  CREATININE 0.37* 0.35*  GLUCOSE 80 100*  CALCIUM 7.5* 8.1*    Recent Labs  05/13/16 0820  INR 1.20  Right hip exam:  Neurovascular intact Sensation intact distally Intact pulses distally Dorsiflexion/Plantar flexion intact Incision: dressing C/D/I Compartment soft  Assessment/Plan: 1 Day Post-Op Procedure(s) (LRB): ANTERIOR APPROACH HEMI HIP ARTHROPLASTY (Right) Acute blood loss anemia, asymptomatic. Expected. Plan: Weight-bear as tolerated on right without hip precautions. Aspirin 325 mg twice daily with SCDs while in hospital. Will treat with aspirin twice daily 1 month postop for DVT prophylaxis. Percocet as needed for pain. Okay to be discharged home when stable medically and when passes physical therapy. Possibly Saturday. Dr. Lala Lundalldorf/Andrew Nida PA on call over  weekend. Up with therapy Discharge home with home health possibly Saturday if does well with physical therapy and is deemed safe.  Layne Lebon G 05/15/2016, 12:35 PM

## 2016-05-16 DIAGNOSIS — D62 Acute posthemorrhagic anemia: Secondary | ICD-10-CM | POA: Insufficient documentation

## 2016-05-16 LAB — CBC
HEMATOCRIT: 26.4 % — AB (ref 36.0–46.0)
HEMOGLOBIN: 9 g/dL — AB (ref 12.0–15.0)
MCH: 37.5 pg — ABNORMAL HIGH (ref 26.0–34.0)
MCHC: 34.1 g/dL (ref 30.0–36.0)
MCV: 110 fL — ABNORMAL HIGH (ref 78.0–100.0)
Platelets: 410 10*3/uL — ABNORMAL HIGH (ref 150–400)
RBC: 2.4 MIL/uL — ABNORMAL LOW (ref 3.87–5.11)
RDW: 13.5 % (ref 11.5–15.5)
WBC: 12.7 10*3/uL — AB (ref 4.0–10.5)

## 2016-05-16 MED ORDER — POLYSACCHARIDE IRON COMPLEX 150 MG PO CAPS: 150.0000 mg | ORAL_CAPSULE | Freq: Two times a day (BID) | ORAL | Status: AC

## 2016-05-16 MED ORDER — ASPIRIN EC 325 MG PO TBEC: 325.0000 mg | DELAYED_RELEASE_TABLET | Freq: Every day | ORAL | Status: AC

## 2016-05-16 MED ORDER — ENSURE ENLIVE PO LIQD
237.0000 mL | Freq: Two times a day (BID) | ORAL | Status: AC
Start: 2016-05-16 — End: ?

## 2016-05-16 NOTE — Plan of Care (Signed)
Physical Therapy Treatment Patient Details Name: Mackenzie [name] MRN: [id number] DOB: 1946/03/08 Today's Date: 05/16/2016    History of Present Illness Patient is a 69 yo female admitted 05/13/16 after a fall with Rt hip fracture.  Patient s/p Rt hemiarthroplasty - anterior approach with no hip precautions.   PMH:  arthritis, ICH after fall.    PT Comments    Patient was unsafe with mobility today. She required frequent cueing for safety. Therapy spent time educating patient on the benefits of rehabilitation but the patient declined. She states she can do it at home despite freqent recent falls. At the end of the session the patient stated she was getting up and pulling her IV out. Chair alarm placed and nursing notified. Therapy observed patient until nursing present but the patient did not appear like she was getting up. Nursing aware. Spoke with MD about patients safety awareness and fall risk. Patient would benefit from skilled therapy at a rehab at this time.   Follow Up Recommendations  SNF     Equipment Recommendations       Recommendations for Other Services       Precautions / Restrictions Precautions Precautions: Fall Restrictions Weight Bearing Restrictions: Yes RLE Weight Bearing: Weight bearing as tolerated    Mobility  Bed Mobility                  Transfers Overall transfer level: Needs assistance Equipment used: Rolling walker (2 wheeled) Transfers: Sit to/from Stand Sit to Stand: Min assist         General transfer comment: verbal cues for hand placement. Very slow with standing. Some difficulty getting poistioned to stand.   Ambulation/Gait Ambulation/Gait assistance: Min guard Ambulation Distance (Feet): 20 Feet Assistive device: Rolling walker (2 wheeled)       General Gait Details: Slow step too gait pattern. Patient got out into hallway and stated " im going to sit in one of those nursing chairs." Therapy had a recliner behind her. She  has very little safety awereness. she fatigues quickly. She puts limited weight on her right lower extremity.    Stairs            Wheelchair Mobility    Modified Rankin (Stroke Patients Only)       Balance           Standing balance support: Bilateral upper extremity supported Standing balance-Leahy Scale: Poor                      Cognition Arousal/Alertness: Awake/alert Behavior During Therapy: Anxious;Agitated Overall Cognitive Status: No family/caregiver present to determine baseline cognitive functioning Area of Impairment: Problem solving;Safety/judgement;Following commands             Problem Solving: Slow processing;Decreased initiation;Requires verbal cues      Exercises      General Comments        Pertinent Vitals/Pain Pain Assessment: Faces Pain Score: 3  Pain Location: rt hip  Pain Descriptors / Indicators: Aching Pain Intervention(s): Limited activity within patient's tolerance;Monitored during session;Utilized relaxation techniques    Home Living                      Prior Function            PT Goals (current goals can now be found in the care plan section) Acute Rehab PT Goals PT Goal Formulation: With patient Time For Goal Achievement: 05/22/16 Potential to Achieve Goals: Fair (  poor safety awareness )    Frequency  7X/week    PT Plan      Co-evaluation             End of Session Equipment Utilized During Treatment: Gait belt Activity Tolerance: Patient limited by pain Patient left: in chair;with call bell/phone within reach;with chair alarm set     Time: 5784-69621155-1220 PT Time Calculation (min) (ACUTE ONLY): 25 min  Charges:  $Gait Training: 8-22 mins $Therapeutic Activity: 8-22 mins                    G Codes:      Dessie Comaavid J Melaysia Streed PT DPT  05/16/2016, 12:53 PM

## 2016-05-16 NOTE — Progress Notes (Signed)
Subjective: 2 Days Post-Op Procedure(s) (LRB): ANTERIOR APPROACH HEMI HIP ARTHROPLASTY (Right)   Patient resting comfortably in bed this morning. No pain. She states that she does not want to go to SNF and that she wants to go home.  Activity level:  wbat Diet tolerance:  ok Voiding:  ok Patient reports pain as mild.    Objective: Vital signs in last 24 hours: Temp:  [97.7 F (36.5 C)-98.7 F (37.1 C)] 97.7 F (36.5 C) (07/15 0504) Pulse Rate:  [70-83] 70 (07/15 0504) Resp:  [12-18] 12 (07/15 0504) BP: (127-153)/(70-90) 153/90 mmHg (07/15 0504) SpO2:  [95 %-98 %] 98 % (07/15 0504)  Labs:  Recent Labs  05/13/16 0820 05/14/16 0513 05/15/16 0337 05/16/16 0512  HGB 10.2* 9.7* 8.9* 9.0*    Recent Labs  05/15/16 0337 05/16/16 0512  WBC 10.7* 12.7*  RBC 2.39* 2.40*  HCT 26.4* 26.4*  PLT 370 410*    Recent Labs  05/13/16 1919 05/14/16 0848  NA 131* 134*  K 3.1* 3.7  CL 100* 100*  CO2 26 29  BUN 6 6  CREATININE 0.37* 0.35*  GLUCOSE 80 100*  CALCIUM 7.5* 8.1*    Recent Labs  05/13/16 0820  INR 1.20    Physical Exam:  Neurologically intact ABD soft Neurovascular intact Sensation intact distally Intact pulses distally Dorsiflexion/Plantar flexion intact Incision: dressing C/D/I and scant drainage No cellulitis present Compartment soft  Assessment/Plan:  2 Days Post-Op Procedure(s) (LRB): ANTERIOR APPROACH HEMI HIP ARTHROPLASTY (Right) Advance diet Up with therapy  From an orthopaedic standpoint she is cleared for D/C. PT recommends SNF but the patient states that she will not go there and will go home. Continue on ASA 325mg  BID x 1 month. Follow up with Dr. [name] in office 2 weeks post op.  Katerra Ingman, Ginger OrganNDREW PAUL 05/16/2016, 8:18 AM

## 2016-05-16 NOTE — Progress Notes (Addendum)
Occupational Therapy Evaluation Patient Details Name: Mackenzie SinghMartha F [name] MRN: [id number] DOB: [birth year] Today's Date: 05/16/2016    History of Present Illness Patient is a 70 yo female admitted 05/13/16 after a fall with Rt hip fracture.  Patient s/p Rt hemiarthroplasty - anterior approach with no hip precautions.   PMH:  arthritis, ICH after fall.. Per CT 7/12 Large intra cerebral hematoma identified previously in LEFT frontal with surrounding vasogenic edema.   Clinical Impression   PTA, pt living at home with room mate. Recent hospital admission 7/3 s/p fall with resulting L frontal hematoma. Pt demonstrates cognitive deficits as demonstrated by scoring a 20/30 on the [facility] Cognitive Assessment (score below 26 is considered impaired). Pt easily agitated and labile during session. Poor insight and awareness of deficits. Requires mod A with mobility and ADL.  Pt is NOT safe to D/C home. Feel pt is good CIR candidate to address deficits resulting from ICH and hip fracture. Will follow acutely to address established goals.   On admission to room, pt trying to disconnect her IV. Nsg notified.     Follow Up Recommendations  CIR;Supervision/Assistance - 24 hour    Equipment Recommendations  None recommended by OT    Recommendations for Other Services Rehab consult     Precautions / Restrictions Precautions Precautions: Fall Precaution Comments: no hip precautions Restrictions RLE Weight Bearing: Weight bearing as tolerated      Mobility Bed Mobility               General bed mobility comments: Pt OOB in chair  Transfers Overall transfer level: Needs assistance Equipment used: Rolling walker (2 wheeled) Transfers: Sit to/from Stand Sit to Stand: Mod assist (from recliner, Min A from elevated BSC)         General transfer comment: verbal cues for hand placement    Balance Overall balance assessment: History of Falls;Needs assistance   Sitting balance-Leahy Scale:  Fair       Standing balance-Leahy Scale: Poor                              ADL Overall ADL's : Needs assistance/impaired     Grooming: Set up;Sitting   Upper Body Bathing: Set up;Supervision/ safety;Sitting   Lower Body Bathing: Moderate assistance;Sit to/from stand   Upper Body Dressing : Set up;Supervision/safety;Sitting   Lower Body Dressing: Moderate assistance;Sit to/from stand   Toilet Transfer: Minimal assistance;BSC;Ambulation   Toileting- Clothing Manipulation and Hygiene: Minimal assistance;Sit to/from stand       Functional mobility during ADLs: Minimal assistance;Rolling walker;Cueing for safety;Cueing for sequencing       Vision Additional Comments: will further assess   Perception     Praxis      Pertinent Vitals/Pain Pain Assessment: Faces Faces Pain Scale: Hurts little more Pain Location: R hip Pain Descriptors / Indicators: Aching;Grimacing;Guarding;Crying Pain Intervention(s): Limited activity within patient's tolerance;Repositioned     Hand Dominance Right   Extremity/Trunk Assessment Upper Extremity Assessment Upper Extremity Assessment: Generalized weakness   Lower Extremity Assessment Lower Extremity Assessment: Generalized weakness RLE Deficits / Details: Decreased strength/ROM due to pain/surgery   Cervical / Trunk Assessment Cervical / Trunk Assessment: Kyphotic   Communication Communication Communication: HOH   Cognition Arousal/Alertness: Awake/alert Behavior During Therapy: Impulsive;Flat affect;Agitated Overall Cognitive Status: Impaired/Different from baseline Area of Impairment: Orientation;Attention;Memory;Safety/judgement;Awareness;Problem solving Orientation Level: Disoriented to;Time Current Attention Level: Sustained Memory: Decreased recall of precautions;Decreased short-term memory   Safety/Judgement: Decreased awareness of  safety;Decreased awareness of deficits Awareness: Emergent Problem  Solving: Slow processing;Difficulty sequencing;Requires verbal cues General Comments: Assess with the MOCA. Received a score of 20/30 with deficits with language flluency, abstraction and delayed recall adn memory. Pt crying at times during session.   General Comments       Exercises       Shoulder Instructions      Home Living Family/patient expects to be discharged to:: Private residence Living Arrangements: Non-relatives/Friends Available Help at Discharge: Friend(s);Available 24 hours/day Type of Home: Apartment Home Access: Level entry     Home Layout: Bed/bath upstairs;Other (Comment) (lives on main level but no bathroom on first floor. Uses BSC)         Bathroom Toilet:  (BSC) Bathroom Accessibility: No   Home Equipment: Walker - 2 wheels;Bedside commode;Cane - single point;Shower seat;Wheelchair - manual   Additional Comments: There is not a bathroom on the first floor. Pt uses the BSC and bathes in the kitchen sink      Prior Functioning/Environment Level of Independence: Needs assistance  Gait / Transfers Assistance Needed: Patient using RW pta ADL's / Homemaking Assistance Needed: independent with bathing and dressing.  Takes baths in the kitchen sink downstairs, uses adult diapers         OT Diagnosis: Generalized weakness;Cognitive deficits;Acute pain   OT Problem List: Decreased strength;Decreased range of motion;Decreased activity tolerance;Impaired balance (sitting and/or standing);Decreased cognition;Decreased safety awareness;Decreased knowledge of use of DME or AE;Decreased knowledge of precautions;Pain;Increased edema   OT Treatment/Interventions: Self-care/ADL training;Therapeutic exercise;DME and/or AE instruction;Therapeutic activities;Cognitive remediation/compensation;Patient/family education;Balance training;Visual/perceptual remediation/compensation    OT Goals(Current goals can be found in the care plan section) Acute Rehab OT Goals Patient  Stated Goal: To go home OT Goal Formulation: With patient Time For Goal Achievement: 05/30/16 Potential to Achieve Goals: Good  OT Frequency: Min 2X/week   Barriers to D/C:            Co-evaluation              End of Session Equipment Utilized During Treatment: Gait belt;Rolling walker Nurse Communication: Mobility status;Other (comment) (Pt attempting to disconnect IV)  Activity Tolerance: Patient tolerated treatment well Patient left: in chair;with call bell/phone within reach;with chair alarm set   Time: 1550-1620 OT Time Calculation (min): 30 min Charges:  OT General Charges $OT Visit: 1 Procedure OT Evaluation $OT Eval Moderate Complexity: 1 Procedure OT Treatments $Self Care/Home Management : 8-22 mins G-Codes:    Yui Mulvaney,HILLARY 06-10-2016, 4:35 PM   [facility], OTR/L  [phone] 10-Jun-2016 Pernell Lenoir, OTR/L  [phone] 06/10/16

## 2016-05-16 NOTE — Discharge Summary (Signed)
Physician Discharge Summary  Mackenzie [name]:[id number] DOB: Jan 17, 1946 DOA: 05/13/2016  PCP: [name], MD  Admit date: 05/13/2016 Discharge date: 05/16/2016  Time spent: 35 minutes  Recommendations for Outpatient Follow-up:  Repeat CBC to follow Hgb trend Repeat BMET to follow electrolytes and renal function   Discharge Diagnoses:  Active Problems:   Insomnia   Subarachnoid bleed (HCC)   Protein-calorie malnutrition, severe   Hip fracture (HCC)   Physical deconditioning   Hyponatremia   Hypokalemia   Hyperglycemia   Macrocytic anemia Acute blood loss anemia  Discharge Condition: stable and improved. Discharge home with [facility] services (this is against recommendations from PT/OT and myself, given high risk for falling again).  Diet recommendation: regular diet   History of present illness:  Mackenzie [name] with medical history significant of arthritis, Vit D deficiency, depression and recent ICH s/p fall.  Who was Discharged on 05/10/16 after admission for subarachnoid hemorrhage after fall and syncopal episode (suggested to be associated with dehydration). Since discharge pt has been feeling generally well, though endorses persistent generalized mild weakness and gate instability. Overnight Mackenzie [name] endorses falling off of chair and striking the floor. Immediate right hip pain. Denies any LOC, head trauma, neck stiffness, fevers, cough and/or shortness of breath. She reports some vague Dysuria and frequency.  Persistent R hip pain. Percocet w/ some benefit. Worse w/ movement. Improves w/ rest. X-ray in ED demonstrated right hip fracture.  Hospital Course:  R femoral neck fracture:  -f/u with Dr. Luiz Blare in 2 weeks -S/p hemi arthroplasty on 7/13 -continue PRN analgesics -PT/OT after surgery recommending SNF; decline by Mackenzie [name] despite multiple interventions. Will arrange for [facility] services and SW follow up at discharge -aspirin 325 mg daily for prophylaxis (given  ICH)  Subaracnoid hemorrhage. Pt syncopized on 7/2 and struck her head sustaining ICH. Discharged on 7/9 after bleed stabilized. CT on day of admission shows improvement in condition. Dr. [name] Discussed new CT results w/ on call neurosurgery Dr Franky Macho who states that there is no further workup/mgt needed for her condition.  - Further imaging if develops any worriesome neurologic symptoms changes -neurosurgery al;so recommended to avoid high doses of NSAID's and anticoagulants   Hyponatremia: 129 on admission. 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  -sodium improved with IVF's -current level 134 at discharge -will recommend repeating BMET at follow up to follow electrolytes   HypoKalemia: 2.8 on admission  -repleted and within normal limits at discharge   Hyperglyecmia: 121 on admission. Suspected from acute stress.  -Last A1c 4.8 -no prior hx of diabetes  Physical deconditioning/protein calorie malnutrition: low total protein and albumin. Frail and cachectic appearing. Multiple mechanical falls recently. Poor nutritional intake and appetite. -encourage to maintain good nutritional intake -feeding supplements indicated at discharge -advise to maintain good hydration   Depression/insomnia: -Suspect depression and emotional lability after ICH and readmission which is somewhat expected.  -no SI or hallucinations -will continue trazodone QHS  Anemia of chronic disease with acute blood loss component: Hgb 10.2 at baseline. Now with acute blood loss anemia -B12 1223 and normal folate -Follow up Hgb 9.0 at discharge -started on niferex BID -no need for transfusion during this admission  -repeat CBC to follow Hgb trend at follow up visit   E.coli UTI -Mackenzie [name] completed therapy with ceftin as an inpatient -no dysuria, no fever, normal WBC's  Procedures:  See below for x-ray reports   Hemi-arthroplasty 7/13  Consultations:  Orthopedic  service   Discharge  Exam: Filed Vitals:   05/16/16 0504 05/16/16 1500  BP: 153/90 148/86  Pulse: 70 74  Temp: 97.7 F (36.5 C) 97.5 F (36.4 C)  Resp: 12 16    General: Afebrile, denies CP and SOB. Mackenzie [name] denies dysuria. Reports pain is mild today. No HA's, AAOX3. Mackenzie [name] found to be competent to make her own decisions and despite multiple attempts to make her understand unsafe plans to go home, she refuse inpatient rehab and also SNF.   Cardiovascular: S1 and S2, no rubs or gallops  Respiratory: CTA bilaterally   Abdomen: soft, NT, ND, positive BS  Musculoskeletal: no edema, no cyanosis; RLE with intact dressings. Mackenzie [name] was sitting on the chair having lunch  Skin: with some face bruises from falls; no rashes or open wounds.   Discharge Instructions   Discharge Instructions    Discharge instructions    Complete by:  As directed   Keep yourself well hydrated Follow up with orthopedic service in 2 weeks Take medications as prescribed Follow instructions for home health services Be very cautious with your mobility and transitioning (to minimize further falls)     Increase activity slowly    Complete by:  As directed      Weight bearing as tolerated    Complete by:  As directed   Laterality:  right  Extremity:  Lower          Current Discharge Medication List    START taking these medications   Details  aspirin EC 325 MG tablet Take 1 tablet (325 mg total) by mouth daily. Take x 1 month post op to decrease risk of blood clots. Qty: 30 tablet, Refills: 0    feeding supplement, ENSURE ENLIVE, (ENSURE ENLIVE) LIQD Take 237 mLs by mouth 2 (two) times daily between meals. Qty: 237 mL, Refills: 12    iron polysaccharides (NIFEREX) 150 MG capsule Take 1 capsule (150 mg total) by mouth 2 (two) times daily. Qty: 60 capsule, Refills: 1    tiZANidine (ZANAFLEX) 2 MG tablet Take 1 tablet (2 mg total) by mouth every 8 (eight) hours as needed for muscle spasms. Qty: 50  tablet, Refills: 0      CONTINUE these medications which have CHANGED   Details  oxyCODONE-acetaminophen (PERCOCET/ROXICET) 5-325 MG tablet Take 1-2 tablets by mouth every 6 (six) hours as needed for severe pain. Qty: 40 tablet, Refills: 0      CONTINUE these medications which have NOT CHANGED   Details  traMADol (ULTRAM) 50 MG tablet Take 50 mg by mouth 4 (four) times daily as needed (pain).  Refills: 0    traZODone (DESYREL) 50 MG tablet Take 25 mg by mouth at bedtime as needed for sleep.  Refills: 0       No Known Allergies Follow-up Information    Follow up with GRAVES,JOHN L, MD. Schedule an appointment as soon as possible for a visit in 2 weeks.   Specialty:  Orthopedic Surgery   Contact information:   [address] [place] [address] [phone]       The results of significant diagnostics from this hospitalization (including imaging, microbiology, ancillary and laboratory) are listed below for reference.    Significant Diagnostic Studies: Dg Lumbar Spine 2-3 Views  05/04/2016  CLINICAL DATA:  Fall with low back and sacral pain. EXAM: LUMBAR SPINE - 2-3 VIEW COMPARISON:  08/06/2008. FINDINGS: Bones are diffusely demineralized. There is mild loss of vertebral body height at L4, unchanged in the interval, consistent with superior endplate  compression fracture. Anterior wedge compression deformity T12 was present previously but has progressed slightly in the interval. No evidence for an acute fracture. Intervertebral disc spaces are preserved. IMPRESSION: Old compression fractures at T12 and L4.  No acute bony abnormality. Electronically Signed   By: Kennith CenterEric  Mansell M.D.   On: 05/04/2016 17:01   Dg Pelvis 1-2 Views  05/04/2016  CLINICAL DATA:  Fall with pelvic pain.  Worse on the right. EXAM: PELVIS - 1-2 VIEW COMPARISON:  10/03/2008 FINDINGS: Osteopenia. Left hip arthroplasty. Sacroiliac joints are symmetric. Osseous irregularity involving the inferior right pubic ramus,  likely related to remote trauma. IMPRESSION: Osteopenia. No convincing evidence of acute osseous abnormality. Subtle osseous irregularity involving the inferior right pubic ramus is likely related to remote trauma, but appears new since 2009. Osteopenia. Left hip arthroplasty. Electronically Signed   By: Jeronimo GreavesKyle  Talbot M.D.   On: 05/04/2016 16:54   Ct Head Wo Contrast  05/13/2016  CLINICAL DATA:  Follow-up intracranial hemorrhage post fall on 05/04/2016, discharge on 05/11/2016, fell again last night out of a chair, bruises on face EXAM: CT HEAD WITHOUT CONTRAST TECHNIQUE: Contiguous axial images were obtained from the base of the skull through the vertex without intravenous contrast. COMPARISON:  05/08/2016 FINDINGS: Generalized atrophy. Stable ventricular morphology without hydrocephalus. Large intra cerebral hematoma again identified in LEFT frontal lobe with surrounding vasogenic edema, high attenuation acute blood component appearing slightly smaller and less well defined. Minimal LEFT-to-RIGHT midline shift again seen approximately 3 mm. Mild persistent effacement of sulci in LEFT frontal lobe. No new areas of intracranial hemorrhage, mass lesion or acute infarction. No extra-axial fluid collections. Atherosclerotic calcifications at the carotid siphons. Persistent air-fluid level RIGHT sphenoid sinus. Paranasal sinuses and mastoid air cells otherwise clear. Bones demineralized but intact. IMPRESSION: Large intra cerebral hematoma identified previously in LEFT frontal lobe appears minimally smaller and less well defined though persistent surrounding vasogenic edema and 3 mm of LEFT-to-RIGHT midline shift remain. No new intracranial abnormalities. Persistent air-fluid level sphenoid sinus. Electronically Signed   By: Ulyses SouthwardMark  Boles M.D.   On: 05/13/2016 10:01   Ct Head Wo Contrast  05/08/2016  CLINICAL DATA:  Follow-up examination for acute subarachnoid hemorrhage. EXAM: CT HEAD WITHOUT CONTRAST TECHNIQUE:  Contiguous axial images were obtained from the base of the skull through the vertex without intravenous contrast. COMPARISON:  Prior CT from 05/05/2016. FINDINGS: Hemorrhagic contusions at the anterior/ inferior left frontal lobe continue to evolve. Largest hemorrhage measures 2.3 x 1.9 cm, similar to previous. Small contusion noted at the left gyrus rectus as well. Surrounding vasogenic edema with localized mass effect has slightly worsened as compared to prior exam. Left-to-right shift now measures up to 2 mm. Increase partial effacement of the left lateral ventricle. No hydrocephalus or evidence of ventricular trapping. Basilar cisterns remain patent. Subdural hematoma overlying the left frontal lobe slightly decreased now measuring up to 5 mm at maximal thickness. Subdural blood overlying the left posterior convexity stable to slightly decreased measuring up to 2 mm. Blossoming parenchymal contusion at the posterior right frontal cortex measures 8 mm (series 3, image 26). Probable small right holo hemispheric subdural hygroma noted, similar to prior. No acute large vessel territory infarct. Stable atrophy with chronic small vessel ischemic disease. Globes and orbits within normal limits. Posterior scalp contusion near the vertex, decreased from prior. Layering hemorrhage within the right sphenoid sinus. Mild opacity posterior left ethmoid air cells. Paranasal sinuses are otherwise clear. No mastoid effusion. There is a subtle right frontal parietal  calvarial fracture (series 4, image 41). No displacement or depression. IMPRESSION: 1. Continued interval evolution of hemorrhagic left frontal lobe contusions with slightly increased localized edema and mass effect. Left-to-right shift now measures up to 4 mm (previously 2 mm). 2. Slight interval decrease in overall size of left subdural hematoma, most evident anteriorly. 3. Interval blossoming of 8 mm right frontal cortical contusion without mass effect. 4.  Nondisplaced, nondepressed right frontoparietal calvarial fracture, stable from previous. 5. Persistent right sphenoid hemo sinus. Electronically Signed   By: Rise Mu M.D.   On: 05/08/2016 06:07   Ct Head Wo Contrast  05/05/2016  CLINICAL DATA:  Follow-up subarachnoid hemorrhage. EXAM: CT HEAD WITHOUT CONTRAST TECHNIQUE: Contiguous axial images were obtained from the base of the skull through the vertex without intravenous contrast. COMPARISON:  CT HEAD May 04, 2016 FINDINGS: INTRACRANIAL CONTENTS: Interval blossoming of LEFT frontal lobe hemorrhagic contusions, the largest measures 2.2 x 1.9 cm. Surrounding low-density vasogenic edema and local mass effect. Trace LEFT frontal subarachnoid hemorrhage. 2 mm LEFT-to-RIGHT midline shift. LEFT frontal acute subdural hematoma focally measures up to 7 mm. 3 mm enlarging LEFT posterior acute subdural hematoma. Enlarging RIGHT holo hemispheric low-density 3 mm probable hygroma. No hydrocephalus. No acute large vascular territory infarcts. Basal cistern patent. ORBITS:  Ocular globes and orbital contents are nonsuspicious. SINUSES: Scattered hemo sinus, most conspicuous in RIGHT sphenoid sinus. The mastoid air cells are well aerated. SKULL/SOFT TISSUES: No skull fracture. Small RIGHT parietal scalp hematoma. IMPRESSION: Interval blossoming LEFT frontal lobe hemorrhage contusions. 2 mm LEFT-to-RIGHT midline shift. Stable 7 mm LEFT frontal subdural hematoma. Increased 3 mm LEFT posterior acute subdural hematoma. Enlarging 3 mm RIGHT probable hygroma. Trace LEFT frontal subarachnoid hemorrhage. No skull fracture definitely identified, presence of paranasal hemo sinus concerning for occult fracture. Electronically Signed   By: Awilda Metro M.D.   On: 05/05/2016 06:22   Ct Head Wo Contrast  05/04/2016  CLINICAL DATA:  Found lying in the parking lot with bleeding. EXAM: CT HEAD WITHOUT CONTRAST CT CERVICAL SPINE WITHOUT CONTRAST TECHNIQUE: Multidetector CT  imaging of the head and cervical spine was performed following the standard protocol without intravenous contrast. Multiplanar CT image reconstructions of the cervical spine were also generated. COMPARISON:  10/01/2008 FINDINGS: CT HEAD FINDINGS There are hemorrhagic contusions in the left frontal lobe, all less than 1 cm in size. There is a small amount of regional traumatic subarachnoid blood. There is an extra-axial hematoma along the greater wing of the sphenoid and left frontal bone, maximal thickness 8 mm. No significant mass effect upon the brain. No midline shift. There is a very small amount of posttraumatic subarachnoid blood overlying the right parietal region. Scalp swelling is present in that area. There is a nondisplaced fracture of the calvarium on the right at that site. There may be mild chronic small-vessel changes of the deep white matter. There is atherosclerotic calcification of the major vessels at the base of the brain. Blood layers in the right maxillary sinus. Blood fills the right division of the sphenoid sinus. Many of the ethmoid air cells are opacified. I do not see a skullbase fracture, though the presence of blood in the sphenoid sinus is worrisome for an occult skullbase fracture. CT CERVICAL SPINE FINDINGS There is curvature convex to the left. No antero or retrolisthesis. There is a minor superior endplate compression fracture of C7. I think there are minor fractures also of the T1 and the T2 vertebral bodies, neither with significant loss of height  or any retropulsed bone. No posterior element fracture is seen. There is extensive bilateral carotid bifurcation atherosclerotic calcification. Benign appearing scarring is present at both lung apices. IMPRESSION: Close head injury with trauma to the right frontoparietal region with scalp swelling. Nondisplaced underlying skull fracture. Small amount of adjacent traumatic subarachnoid hemorrhage on the right. Multiple sub cm hemorrhagic  contusions in the left frontal lobe. Left frontal posttraumatic subarachnoid hemorrhage. Left-sided extra-axial collection, favored to represent a subdural hematoma, maximal thickness 8 mm is without significant mass effect. I do not see regional fracture to suggest that this could be epidural. Blood in the right maxillary and sphenoid sinuses. This raises the possibility of an occult skullbase fracture, though I do not identify that. Mild compression fractures at C7, T1 and T2 with loss of height of less than 10%. No retropulsed bone or canal compromise. Critical Value/emergent results were called by telephone at the time of interpretation on 05/04/2016 at 4:55 pm to Dr. Linwood Dibbles , who verbally acknowledged these results. Electronically Signed   By: Paulina Fusi M.D.   On: 05/04/2016 16:55   Ct Cervical Spine Wo Contrast  05/04/2016  CLINICAL DATA:  Found lying in the parking lot with bleeding. EXAM: CT HEAD WITHOUT CONTRAST CT CERVICAL SPINE WITHOUT CONTRAST TECHNIQUE: Multidetector CT imaging of the head and cervical spine was performed following the standard protocol without intravenous contrast. Multiplanar CT image reconstructions of the cervical spine were also generated. COMPARISON:  10/01/2008 FINDINGS: CT HEAD FINDINGS There are hemorrhagic contusions in the left frontal lobe, all less than 1 cm in size. There is a small amount of regional traumatic subarachnoid blood. There is an extra-axial hematoma along the greater wing of the sphenoid and left frontal bone, maximal thickness 8 mm. No significant mass effect upon the brain. No midline shift. There is a very small amount of posttraumatic subarachnoid blood overlying the right parietal region. Scalp swelling is present in that area. There is a nondisplaced fracture of the calvarium on the right at that site. There may be mild chronic small-vessel changes of the deep white matter. There is atherosclerotic calcification of the major vessels at the base of  the brain. Blood layers in the right maxillary sinus. Blood fills the right division of the sphenoid sinus. Many of the ethmoid air cells are opacified. I do not see a skullbase fracture, though the presence of blood in the sphenoid sinus is worrisome for an occult skullbase fracture. CT CERVICAL SPINE FINDINGS There is curvature convex to the left. No antero or retrolisthesis. There is a minor superior endplate compression fracture of C7. I think there are minor fractures also of the T1 and the T2 vertebral bodies, neither with significant loss of height or any retropulsed bone. No posterior element fracture is seen. There is extensive bilateral carotid bifurcation atherosclerotic calcification. Benign appearing scarring is present at both lung apices. IMPRESSION: Close head injury with trauma to the right frontoparietal region with scalp swelling. Nondisplaced underlying skull fracture. Small amount of adjacent traumatic subarachnoid hemorrhage on the right. Multiple sub cm hemorrhagic contusions in the left frontal lobe. Left frontal posttraumatic subarachnoid hemorrhage. Left-sided extra-axial collection, favored to represent a subdural hematoma, maximal thickness 8 mm is without significant mass effect. I do not see regional fracture to suggest that this could be epidural. Blood in the right maxillary and sphenoid sinuses. This raises the possibility of an occult skullbase fracture, though I do not identify that. Mild compression fractures at C7, T1 and T2  with loss of height of less than 10%. No retropulsed bone or canal compromise. Critical Value/emergent results were called by telephone at the time of interpretation on 05/04/2016 at 4:55 pm to Dr. Linwood Dibbles , who verbally acknowledged these results. Electronically Signed   By: Paulina Fusi M.D.   On: 05/04/2016 16:55   Dg Chest Port 1 View  05/09/2016  CLINICAL DATA:  Fever today. EXAM: PORTABLE CHEST 1 VIEW COMPARISON:  Frontal and lateral views 01/10/2010  FINDINGS: Cervical collar obscures the lung apices. The lungs remain hyperinflated and bronchial thickening. No focal airspace disease. The heart size and mediastinal contours are normal. No evidence of pleural effusion. No pneumothorax. No acute rib fracture is seen. IMPRESSION: Chronic hyperinflation and bronchial thickening. No acute abnormality or focal airspace disease. Electronically Signed   By: Rubye Oaks M.D.   On: 05/09/2016 19:17   Dg Hip Operative Unilat W Or W/o Pelvis Right  05/14/2016  CLINICAL DATA:  Right hip hemiarthroplasty performed to treat a subcapital right femoral neck fracture. EXAM: OPERATIVE RIGHT HIP (WITH PELVIS IF PERFORMED) 1 VIEW TECHNIQUE: Fluoroscopic spot image(s) were submitted for interpretation post-operatively. COMPARISON:  Right hip x-rays yesterday. FINDINGS: A single AP spot image demonstrates anatomic alignment of the right femoral prosthesis relative to the acetabulum. IMPRESSION: Anatomic alignment in the AP projection post right hip hemiarthroplasty. Electronically Signed   By: Hulan Saas M.D.   On: 05/14/2016 17:53   Dg Hips Bilat With Pelvis 3-4 Views  05/13/2016  CLINICAL DATA:  Pain following fall EXAM: DG HIP (WITH OR WITHOUT PELVIS) 3-4V BILAT COMPARISON:  May 04, 2016 FINDINGS: Images of frontal pelvis as well as frontal and lateral hips bilaterally obtained. There is a subcapital femoral neck fracture on the right with varus angulation of the fracture site and slight superior migration of the right femoral shaft with respect to the femoral head. There is a prior fracture of the right ischium in near anatomic alignment. 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 Bones are osteoporotic. No dislocations. IMPRESSION: Subcapital femoral neck fracture on the right with varus angulation at the fracture site. 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 Fracture of the right ischium in near  anatomic alignment. Bones osteoporotic. Electronically Signed   By: Bretta Bang III M.D.   On: 05/13/2016 09:03    Microbiology: Recent Results (from the past 240 hour(s))  Culture, Urine     Status: Abnormal   Collection Time: 05/09/16  5:29 PM  Result Value Ref Range Status   Specimen Description URINE, CLEAN CATCH  Final   Special Requests NONE  Final   Culture >=100,000 COLONIES/mL ESCHERICHIA COLI (A)  Final   Report Status 05/11/2016 FINAL  Final   Organism ID, Bacteria ESCHERICHIA COLI (A)  Final      Susceptibility   Escherichia coli - MIC*    AMPICILLIN <=2 SENSITIVE Sensitive     CEFAZOLIN <=4 SENSITIVE Sensitive     CEFTRIAXONE <=1 SENSITIVE Sensitive     CIPROFLOXACIN <=0.25 SENSITIVE Sensitive     GENTAMICIN <=1 SENSITIVE Sensitive     IMIPENEM <=0.25 SENSITIVE Sensitive     NITROFURANTOIN <=16 SENSITIVE Sensitive     TRIMETH/SULFA <=20 SENSITIVE Sensitive     AMPICILLIN/SULBACTAM <=2 SENSITIVE Sensitive     PIP/TAZO <=4 SENSITIVE Sensitive     * >=100,000 COLONIES/mL ESCHERICHIA COLI     Labs: Basic Metabolic Panel:  Recent Labs Lab 05/13/16 0820  05/13/16 1418 05/13/16 1919 05/14/16 0848  NA 129*  --  131* 134*  K 2.8*  --  3.1* 3.7  CL 89*  --  100* 100*  CO2 33*  --  26 29  GLUCOSE 121*  --  80 100*  BUN 8  --  6 6  CREATININE 0.42*  --  0.37* 0.35*  CALCIUM 8.2*  --  7.5* 8.1*  MG  --  1.7  --   --   PHOS  --  2.6  --   --    CBC:  Recent Labs Lab 05/13/16 0820 05/14/16 0513 05/15/16 0337 05/16/16 0512  WBC 9.8 8.6 10.7* 12.7*  NEUTROABS 8.2*  --   --   --   HGB 10.2* 9.7* 8.9* 9.0*  HCT 29.8* 28.1* 26.4* 26.4*  MCV 106.8* 109.8* 110.5* 110.0*  PLT 330 351 370 410*   Signed:  Vassie Loll MD.  [facility] 05/16/2016, 4:50 PM

## 2016-05-17 LAB — CBC
HCT: 24.9 % — ABNORMAL LOW (ref 36.0–46.0)
HEMOGLOBIN: 8.4 g/dL — AB (ref 12.0–15.0)
MCH: 37.2 pg — AB (ref 26.0–34.0)
MCHC: 33.7 g/dL (ref 30.0–36.0)
MCV: 110.2 fL — ABNORMAL HIGH (ref 78.0–100.0)
Platelets: 416 10*3/uL — ABNORMAL HIGH (ref 150–400)
RBC: 2.26 MIL/uL — ABNORMAL LOW (ref 3.87–5.11)
RDW: 13.7 % (ref 11.5–15.5)
WBC: 8.9 10*3/uL (ref 4.0–10.5)

## 2016-05-17 NOTE — Progress Notes (Signed)
Discharge instructions and prescriptions provided to patient.  Home health established by case management.  [facility] to transport patient home.  Per roommate, there is a friend named "Pooh" that will be present upon arrival.  Cell phone and charger with patient.  IV removed.

## 2016-05-17 NOTE — Progress Notes (Signed)
Physical Therapy Treatment Patient Details Name: Mackenzie [name] MRN: [id number] DOB: [birth year] Today's Date: 05/17/2016    History of Present Illness Patient is a 70 yo female admitted 05/13/16 after a fall with Rt hip fracture.  Patient s/p Rt hemiarthroplasty - anterior approach with no hip precautions.   PMH:  arthritis, ICH after fall.. Per CT 7/12 Large intra cerebral hematoma identified previously in LEFT frontal with surrounding vasogenic edema.    PT Comments    Needing encouragement to participate; We discussed dc planning, and Mackenzie [name] briefly told me about her experience with a hip fracture 8 years ago, she tells me she went to rehab for 2 months; During the session, she goes back and forth between insisting on going home, and then stating she can't; at one point she told me she will "do whatever, " and I took that as a potential opportunity to arrange for some post-acute rehab; Spoke with Dr. Gwenlyn Perking, and left a voicemail with Verdon Cummins, LCSW;   Given her recent fall with ICH, with cognitive deficits, another fall with hip fx, and that she does have some support from a roommate at home (though I'm not sure of the amount of help Mackenzie [name] can provide), I believe it is worth considering CIR -- in our conversation, she did not give a resounding "yes" to rehab, but she did not say outright "no" either; Will place a CIR screen.  It is an unfortunate situation; I'm afraid if she chooses to go home without post-acute rehab, we will see her back in the hospital soon with more injuries from falls.   Follow Up Recommendations  CIR     Equipment Recommendations  None recommended by PT (she sounds pretty well-equipped)    Recommendations for Other Services       Precautions / Restrictions Precautions Precautions: Fall Precaution Comments: no hip precautions Restrictions Weight Bearing Restrictions: Yes RLE Weight Bearing: Weight bearing as tolerated    Mobility  Bed Mobility Overal  bed mobility: Needs Assistance Bed Mobility: Supine to Sit     Supine to sit: Mod assist;+2 for safety/equipment     General bed mobility comments: Very slow moving and painful; Used bed pad to assist hips closer to EOB with mod assist, then mod assist to elevate trunk to sit  Transfers Overall transfer level: Needs assistance Equipment used: Rolling walker (2 wheeled) Transfers: Sit to/from Stand Sit to Stand: Mod assist         General transfer comment: Mod assist to power up from low surface (will be standing from low couch at home); minguard assist to stand from elevated bed; cues for safety control and hand placement with descent to sit   Ambulation/Gait Ambulation/Gait assistance: Min assist Ambulation Distance (Feet): 15 Feet Assistive device: Rolling walker (2 wheeled) Gait Pattern/deviations: Step-to pattern     General Gait Details: min assist mostly for Rw management as pt tends to have RW too far in front of her; had to block leg of RW to keep RW close; cues and min assist with RW for stabiity during turns   Stairs            Wheelchair Mobility    Modified Rankin (Stroke Patients Only)       Balance Overall balance assessment: History of Falls   Sitting balance-Leahy Scale: Fair       Standing balance-Leahy Scale: Poor  Cognition Arousal/Alertness: Awake/alert Behavior During Therapy: Impulsive;Flat affect;Agitated Overall Cognitive Status: Impaired/Different from baseline Area of Impairment: Orientation;Attention;Memory;Safety/judgement;Awareness;Problem solving   Current Attention Level: Sustained (easily internally distracted) Memory: Decreased recall of precautions;Decreased short-term memory Following Commands: Follows multi-step commands with increased time Safety/Judgement: Decreased awareness of safety;Decreased awareness of deficits Awareness: Emergent Problem Solving: Slow processing;Difficulty  sequencing;Requires verbal cues      Exercises      General Comments        Pertinent Vitals/Pain Pain Assessment: 0-10 Pain Score: 10-Worst pain ever Pain Location: R Hip Pain Descriptors / Indicators: Crying Pain Intervention(s): Monitored during session;Premedicated before session;RN gave pain meds during session    Home Living                      Prior Function            PT Goals (current goals can now be found in the care plan section) Acute Rehab PT Goals Patient Stated Goal: Did not state this session PT Goal Formulation: With patient Time For Goal Achievement: 05/22/16 Potential to Achieve Goals: Fair (poor safety awareness ) Progress towards PT goals: Not progressing toward goals - comment (limited by pain today)    Frequency  7X/week    PT Plan Discharge plan needs to be updated    Co-evaluation             End of Session Equipment Utilized During Treatment: Gait belt Activity Tolerance: Patient limited by pain Patient left: in chair;with call bell/phone within reach;with chair alarm set     Time: [id number] PT Time Calculation (min) (ACUTE ONLY): 39 min  Charges:  $Gait Training: 8-22 mins $Therapeutic Activity: 23-37 mins                    G Codes:      Olen PelGarrigan, Tejuan Gholson Hamff 05/17/2016, 11:24 AM  Van ClinesHolly Einar Nolasco, PT  Acute Rehabilitation Services Pager [phone] Office [phone]

## 2016-05-17 NOTE — Progress Notes (Signed)
Occupational Therapy Treatment Patient Details Name: Mackenzie SinghMartha F [name] MRN: [id number] DOB: May 20, 1946 Today's Date: 05/17/2016    History of present illness Patient is a 70 yo female admitted 05/13/16 after a fall with Rt hip fracture.  Patient s/p Rt hemiarthroplasty - anterior approach with no hip precautions.   PMH:  arthritis, ICH after fall.. Per CT 7/12 Large intra cerebral hematoma identified previously in LEFT frontal with surrounding vasogenic edema.   OT comments  Pt with significant cognitive deficits and is unsafe to D/C home and is at high risk to fall again and re-injure herself. Pt unable to manage her ADL at this time. When asked how she will mange going to the bathroom, she states "I will just use diapers". When asked how she plans to get off the couch where she sleeps when the therapist has to physically lift her up, she states "I will just stay on the cough and use the diaper". Pt also states that she plans to get a cab home and can not reason that the cab driver can not physically lift her up or help her walk into the house, when she is only to walk @ 10 feet at this time based on yesterday's session given she refused to stand up this session.  Pt with recent R frontal ICH,  which is likely affecting her attention, problem solving, insight, safety, judgment and higher level problem solving and reasoning affecting her ability to care for herself. Unsure of caregiver support available. Do not feel pt is competent to make decisions at this time and recommend pt's competency be assessed if D/C plan is home. Continue to recommend CIR if pt has caregiver support (although staff has not seen any caregivers this visit and no one is available to pick pt up from hospital). If no caregiver support, recommend SNFfor rehab. If pt goes home, she will need ambulance transport home, HHOT/PT, HH Aide, HHSW (who can set up Adult Protective Services).  Follow Up Recommendations  CIR;Supervision/Assistance  - 24 hour    Equipment Recommendations  None recommended by OT    Recommendations for Other Services Rehab consult    Precautions / Restrictions Precautions Precautions: Fall Precaution Comments: no hip precautions Restrictions Weight Bearing Restrictions: Yes RLE Weight Bearing: Weight bearing as tolerated       Mobility Bed Mobility Overal bed mobility: Needs Assistance Bed Mobility: Sit to Supine;Supine to Sit     Supine to sit: HOB elevated;Supervision Sit to supine: Supervision   General bed mobility comments: 15 min taken to get in/out of bed with pt crying. Educated on using LLE to assist wtih RUE. Pt unable to problem solve how to assist wtih moving leg.  Transfers Overall transfer level: Needs assistance Equipment used: Rolling walker (2 wheeled) Transfers: Sit to/from Stand Sit to Stand: Mod assist         General transfer comment: pt refused    Balance Overall balance assessment: History of Falls   Sitting balance-Leahy Scale: Fair       Standing balance-Leahy Scale: Poor                     ADL                                       Functional mobility during ADLs:  (declined to stand up) General ADL Comments: Pt educated on use of AE for  LB ADL. Pt declined to return demosntrate. Pt states she is planning on just wearing diapers. States her friend [name] will empty BSC but bathroom is upstairs and she has difficulty climbing steps.      Vision                     Perception     Praxis      Cognition   Behavior During Therapy: Impulsive;Flat affect;Restless;Agitated Overall Cognitive Status: Impaired/Different from baseline Area of Impairment: Orientation;Attention;Memory;Following commands;Safety/judgement;Awareness;Problem solving Orientation Level: Disoriented to;Time Current Attention Level: Sustained (easily distracted) Memory: Decreased short-term memory  Following Commands: Follows one step  commands with increased time Safety/Judgement: Decreased awareness of safety;Decreased awareness of deficits Awareness: Emergent Problem Solving: Slow processing;Decreased initiation;Difficulty sequencing;Requires verbal cues General Comments: during bed mobility, pt distracted by veins on leg. Began rubbing leg and given cue again to move to EOB. pt would attend to tak to move to EOB, then continue to get distracted by veins on her leg adn requried continued cues to complete task    Extremity/Trunk Assessment               Exercises     Shoulder Instructions       General Comments      Pertinent Vitals/ Pain       Pain Assessment: 0-10 Pain Score: 10-Worst pain ever Pain Location: R hip Pain Descriptors / Indicators: Aching;Discomfort;Crying;Moaning Pain Intervention(s): Limited activity within patient's tolerance;Repositioned  Home Living                                          Prior Functioning/Environment              Frequency Min 2X/week     Progress Toward Goals  OT Goals(current goals can now be found in the care plan section)  Progress towards OT goals: Progressing toward goals  Acute Rehab OT Goals Patient Stated Goal: to go home OT Goal Formulation: With patient Time For Goal Achievement: 05/30/16 Potential to Achieve Goals: Good ADL Goals Pt Will Perform Lower Body Bathing: sit to/from stand;with adaptive equipment;with modified independence Pt Will Perform Lower Body Dressing: with modified independence;sit to/from stand;with adaptive equipment Pt Will Transfer to Toilet: with modified independence;bedside commode;ambulating Pt Will Perform Toileting - Clothing Manipulation and hygiene: with modified independence;sitting/lateral leans Additional ADL Goal #1: Pt will demonstrate anticipatory awareness during ADL task in non distracting environment.  Plan Discharge plan remains appropriate    Co-evaluation                  End of Session     Activity Tolerance Treatment limited secondary to agitation   Patient Left in bed;with call bell/phone within reach;with bed alarm set   Nurse Communication Mobility status;Other (comment) (concerns regarding D/C)        Time: 0981-1914 OT Time Calculation (min): 18 min  Charges: OT General Charges $OT Visit: 1 Procedure OT Treatments $Self Care/Home Management : 8-22 mins  Ambika Zettlemoyer,HILLARY 05/17/2016, 11:55 AM   Luisa Dago, OTR/L  [phone] 05/17/2016

## 2016-05-17 NOTE — Progress Notes (Signed)
Subjective: 3 Days Post-Op Procedure(s) (LRB): ANTERIOR APPROACH HEMI HIP ARTHROPLASTY (Right)  Patient resting comfortably sitting on bed this morning. No pain. She states that she does not want to go to SNF and that she wants to go home.  Activity level:  wbat Diet tolerance:  ok Voiding:  ok Patient reports pain as mild and moderate.    Objective: Vital signs in last 24 hours: Temp:  [97.5 F (36.4 C)-98.7 F (37.1 C)] 98.2 F (36.8 C) (07/16 0453) Pulse Rate:  [74-84] 74 (07/16 0453) Resp:  [16] 16 (07/16 0453) BP: (112-148)/(60-86) 116/62 mmHg (07/16 0453) SpO2:  [95 %-99 %] 99 % (07/16 0453)  Labs:  Recent Labs  05/15/16 0337 05/16/16 0512 05/17/16 0219  HGB 8.9* 9.0* 8.4*    Recent Labs  05/16/16 0512 05/17/16 0219  WBC 12.7* 8.9  RBC 2.40* 2.26*  HCT 26.4* 24.9*  PLT 410* 416*   No results for input(s): NA, K, CL, CO2, BUN, CREATININE, GLUCOSE, CALCIUM in the last 72 hours. No results for input(s): LABPT, INR in the last 72 hours.  Physical Exam:  Neurologically intact ABD soft Neurovascular intact Sensation intact distally Intact pulses distally Dorsiflexion/Plantar flexion intact Incision: dressing C/D/I and no drainage No cellulitis present Compartment soft  Assessment/Plan:  3 Days Post-Op Procedure(s) (LRB): ANTERIOR APPROACH HEMI HIP ARTHROPLASTY (Right) Advance diet Up with therapy  From an orthopaedic standpoint she is cleared for D/C. PT recommends SNF but the patient states that she will not go there and will go home. Continue on ASA 325mg  BID x 1 month. Follow up with Dr. [name] in office 2 weeks post op.    Ludwig Tugwell, Ginger OrganNDREW PAUL 05/17/2016, 10:47 AM

## 2016-05-17 NOTE — Care Management Note (Signed)
Case Management Note  Patient Details  Name: Mackenzie [name] MRN: [id number] Date of Birth: Sep 03, 1946  Subjective/Objective:                  Rt hemiarthroplasty - anterior approach Action/Plan: Discharge planning Expected Discharge Date:  05/17/16               Expected Discharge Plan:  [place]  In-House Referral:     Discharge planning Services  NA, CM Consult  Post Acute Care Choice:  Home Health Choice offered to:  NA, Patient  DME Arranged:  N/A DME Agency:  NA  HH Arranged:  RN, PT, OT, Nurse's Aide, Social Work [employer] Agency:  [place]  Status of Service:  Completed, signed off  If discussed at [employer], dates discussed:    Additional Comments: CM met with pt in room to discuss discharge.  Pt is adamant opposed to going to SNF and states she IS going home.  Pt has a daughter and son but is refusing this CM permission to reach out to adult children for asst.  Pt states she will go home with PTAR.  CM has notified [place] rep, Tiffany for [facility].  CM has arranged PTAR packet and RN will call at discharge time.  No other Cm needs were communicated. Dellie Catholic, RN 05/17/2016, 12:21 PM

## 2016-05-27 NOTE — Anesthesia Postprocedure Evaluation (Signed)
Anesthesia Post Note  Patient: Mackenzie [name]  Procedure(s) Performed: Procedure(s) (LRB): ANTERIOR APPROACH HEMI HIP ARTHROPLASTY (Right)  Patient location during evaluation: PACU Anesthesia Type: General Level of consciousness: awake and alert Pain management: pain level controlled Vital Signs Assessment: post-procedure vital signs reviewed and stable Respiratory status: spontaneous breathing, nonlabored ventilation, respiratory function stable and patient connected to nasal cannula oxygen Cardiovascular status: blood pressure returned to baseline and stable Postop Assessment: no signs of nausea or vomiting Anesthetic complications: no    Last Vitals:  Vitals:   05/17/16 0453 05/17/16 1418  BP: 116/62 (!) 144/86  Pulse: 74 88  Resp: 16   Temp: 36.8 C 36.9 C    Last Pain:  Vitals:   05/17/16 1418  TempSrc: Oral  PainSc:                  [name]

## 2016-06-10 DIAGNOSIS — R269 Unspecified abnormalities of gait and mobility: Secondary | ICD-10-CM | POA: Diagnosis not present

## 2016-06-11 DIAGNOSIS — S72091D Other fracture of head and neck of right femur, subsequent encounter for closed fracture with routine healing: Secondary | ICD-10-CM | POA: Diagnosis not present

## 2016-06-11 DIAGNOSIS — L89213 Pressure ulcer of right hip, stage 3: Secondary | ICD-10-CM | POA: Diagnosis not present

## 2016-06-16 DIAGNOSIS — Z87891 Personal history of nicotine dependence: Secondary | ICD-10-CM | POA: Diagnosis not present

## 2016-06-16 DIAGNOSIS — Z9181 History of falling: Secondary | ICD-10-CM | POA: Diagnosis not present

## 2016-06-16 DIAGNOSIS — L89213 Pressure ulcer of right hip, stage 3: Secondary | ICD-10-CM | POA: Diagnosis not present

## 2016-06-16 DIAGNOSIS — Z96641 Presence of right artificial hip joint: Secondary | ICD-10-CM | POA: Diagnosis not present

## 2016-07-09 DIAGNOSIS — S72091D Other fracture of head and neck of right femur, subsequent encounter for closed fracture with routine healing: Secondary | ICD-10-CM | POA: Diagnosis not present

## 2016-07-11 DIAGNOSIS — R269 Unspecified abnormalities of gait and mobility: Secondary | ICD-10-CM | POA: Diagnosis not present

## 2016-07-23 DIAGNOSIS — Z79891 Long term (current) use of opiate analgesic: Secondary | ICD-10-CM | POA: Diagnosis not present

## 2016-07-23 DIAGNOSIS — M47817 Spondylosis without myelopathy or radiculopathy, lumbosacral region: Secondary | ICD-10-CM | POA: Diagnosis not present

## 2016-07-23 DIAGNOSIS — M25569 Pain in unspecified knee: Secondary | ICD-10-CM | POA: Diagnosis not present

## 2016-07-23 DIAGNOSIS — G894 Chronic pain syndrome: Secondary | ICD-10-CM | POA: Diagnosis not present

## 2016-07-23 DIAGNOSIS — M5137 Other intervertebral disc degeneration, lumbosacral region: Secondary | ICD-10-CM | POA: Diagnosis not present

## 2016-07-23 DIAGNOSIS — Z79899 Other long term (current) drug therapy: Secondary | ICD-10-CM | POA: Diagnosis not present

## 2016-08-10 DIAGNOSIS — R269 Unspecified abnormalities of gait and mobility: Secondary | ICD-10-CM | POA: Diagnosis not present

## 2016-08-25 DIAGNOSIS — M25561 Pain in right knee: Secondary | ICD-10-CM | POA: Diagnosis not present

## 2016-08-25 DIAGNOSIS — M25562 Pain in left knee: Secondary | ICD-10-CM | POA: Diagnosis not present

## 2016-09-10 DIAGNOSIS — R269 Unspecified abnormalities of gait and mobility: Secondary | ICD-10-CM | POA: Diagnosis not present

## 2016-10-10 DIAGNOSIS — R269 Unspecified abnormalities of gait and mobility: Secondary | ICD-10-CM | POA: Diagnosis not present

## 2016-11-10 DIAGNOSIS — R269 Unspecified abnormalities of gait and mobility: Secondary | ICD-10-CM | POA: Diagnosis not present

## 2016-11-11 DIAGNOSIS — F411 Generalized anxiety disorder: Secondary | ICD-10-CM | POA: Diagnosis not present

## 2016-11-11 DIAGNOSIS — M545 Low back pain: Secondary | ICD-10-CM | POA: Diagnosis not present

## 2016-11-11 DIAGNOSIS — G4700 Insomnia, unspecified: Secondary | ICD-10-CM | POA: Diagnosis not present

## 2016-11-11 DIAGNOSIS — I10 Essential (primary) hypertension: Secondary | ICD-10-CM | POA: Diagnosis not present

## 2016-12-11 DIAGNOSIS — R269 Unspecified abnormalities of gait and mobility: Secondary | ICD-10-CM | POA: Diagnosis not present

## 2016-12-28 DIAGNOSIS — M5416 Radiculopathy, lumbar region: Secondary | ICD-10-CM | POA: Diagnosis not present

## 2016-12-28 DIAGNOSIS — M545 Low back pain: Secondary | ICD-10-CM | POA: Diagnosis not present

## 2016-12-28 DIAGNOSIS — G894 Chronic pain syndrome: Secondary | ICD-10-CM | POA: Diagnosis not present

## 2017-01-12 ENCOUNTER — Encounter (HOSPITAL_COMMUNITY): Payer: Self-pay

## 2017-01-12 ENCOUNTER — Emergency Department (HOSPITAL_COMMUNITY)
Admission: EM | Admit: 2017-01-12 | Discharge: 2017-01-12 | Disposition: A | Discharge status: Home / Self Care | Payer: Medicare Other | Attending: Emergency Medicine | Admitting: Emergency Medicine

## 2017-01-12 DIAGNOSIS — S72009A Fracture of unspecified part of neck of unspecified femur, initial encounter for closed fracture: Secondary | ICD-10-CM | POA: Diagnosis not present

## 2017-01-12 DIAGNOSIS — M5489 Other dorsalgia: Secondary | ICD-10-CM | POA: Diagnosis not present

## 2017-01-12 DIAGNOSIS — Z87891 Personal history of nicotine dependence: Secondary | ICD-10-CM | POA: Insufficient documentation

## 2017-01-12 DIAGNOSIS — G8929 Other chronic pain: Secondary | ICD-10-CM

## 2017-01-12 DIAGNOSIS — M545 Low back pain, unspecified: Secondary | ICD-10-CM

## 2017-01-12 DIAGNOSIS — R030 Elevated blood-pressure reading, without diagnosis of hypertension: Secondary | ICD-10-CM | POA: Diagnosis not present

## 2017-01-12 DIAGNOSIS — Z7982 Long term (current) use of aspirin: Secondary | ICD-10-CM | POA: Diagnosis not present

## 2017-01-12 DIAGNOSIS — Z96641 Presence of right artificial hip joint: Secondary | ICD-10-CM | POA: Insufficient documentation

## 2017-01-12 MED ORDER — ACETAMINOPHEN 500 MG PO TABS
1000.0000 mg | ORAL_TABLET | Freq: Once | ORAL | Status: AC
  Administered 2017-01-12: 1000 mg via ORAL
  Filled 2017-01-12: qty 2

## 2017-01-12 NOTE — ED Provider Notes (Signed)
[facility]-EMERGENCY DEPT Provider Note   CSN: [id number] Arrival date & time: 01/12/17  4098     History   Chief Complaint Chief Complaint  Patient presents with  . Leg Pain  . Back Pain    HPI Mackenzie [name] is a 71 y.o. female.  HPI  71 year old female with a history of chronic back pain and narcotic dependency presents the ED with lower back pain consistent with her chronic back pain. She denies any acute changes to her back pain. Denies any urinary or bladder incontinence. Reports ambulating at baseline. Denies any new lower extremity weakness. Denies any other physical complaints.  Per EMS, they were contacted by the daughter for concern for overdose on narcotics. Patient reports that the daughter does not live in the state and is unsure of how she would know that she had overdosed on narcotics.  No number in the chart for the daughter. Patient unable to provide daughters information.   Past Medical History:  Diagnosis Date  . Arthritis   . Vein, varicose   . Vitamin D deficiency     Patient Active Problem List   Diagnosis Date Noted  . Acute blood loss anemia   . Hip fracture (HCC) 05/13/2016  . Physical deconditioning 05/13/2016  . Hyponatremia 05/13/2016  . Hypokalemia 05/13/2016  . Hyperglycemia 05/13/2016  . Macrocytic anemia 05/13/2016  . Fall   . Hip fracture, right (HCC)   . Protein-calorie malnutrition, severe 05/06/2016  . Subarachnoid bleed (HCC) 05/04/2016  . Pressure ulcer 05/04/2016  . Unsteady gait 03/06/2016  . Insomnia 03/06/2016  . Generalized anxiety disorder 02/24/2016  . Depression 02/24/2016  . Edema 02/24/2016  . Moderate malnutrition (HCC)   . Right knee pain 01/26/2016  . 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 ARTHRALGIA 11/29/2009  . CBC, ABNORMAL 11/29/2009  . VITAMIN D DEFICIENCY 10/21/2009  . SEBACEOUS CYST, NECK 10/18/2009  . CAROTID BRUIT 10/18/2009    Past Surgical History:  Procedure  Laterality Date  . ANTERIOR APPROACH HEMI HIP ARTHROPLASTY Right 05/14/2016   Procedure: ANTERIOR APPROACH HEMI HIP ARTHROPLASTY;  Surgeon: Jodi Geralds, MD;  Location: [facility] OR;  Service: Orthopedics;  Laterality: Right;  . HIP SURGERY    . JOINT REPLACEMENT      OB History    No data available       Home Medications    Prior to Admission medications   Medication Sig Start Date End Date Taking? Authorizing Provider  Aspirin-Acetaminophen-Caffeine (GOODY HEADACHE PO) Take 1 packet by mouth 4 (four) times daily as needed (pain).   Yes Historical Provider, MD  feeding supplement, ENSURE ENLIVE, (ENSURE ENLIVE) LIQD Take 237 mLs by mouth 2 (two) times daily between meals. Patient taking differently: Take 237 mLs by mouth 6 (six) times daily.  05/16/16  Yes Vassie Loll, MD  loperamide (IMODIUM) 2 MG capsule Take 2 mg by mouth daily as needed for diarrhea or loose stools.   Yes Historical Provider, MD  nicotine (NICODERM CQ - DOSED IN MG/24 HR) 7 mg/24hr patch Place 7 mg onto the skin daily.   Yes Historical Provider, MD  ranitidine (ZANTAC) 75 MG tablet Take 75 mg by mouth 3 (three) times daily.   Yes Historical Provider, MD  aspirin EC 325 MG tablet Take 1 tablet (325 mg total) by mouth daily. Take x 1 month post op to decrease risk of blood clots. Patient not taking: Reported on 01/12/2017 05/16/16   Vassie Loll, MD  iron polysaccharides (NIFEREX)  150 MG capsule Take 1 capsule (150 mg total) by mouth 2 (two) times daily. Patient not taking: Reported on 01/12/2017 05/16/16   Vassie Loll, MD  oxyCODONE-acetaminophen (PERCOCET/ROXICET) 5-325 MG tablet Take 1-2 tablets by mouth every 6 (six) hours as needed for severe pain. Patient not taking: Reported on 01/12/2017 05/14/16   Marshia Ly, PA-C  tiZANidine (ZANAFLEX) 2 MG tablet Take 1 tablet (2 mg total) by mouth every 8 (eight) hours as needed for muscle spasms. Patient not taking: Reported on 01/12/2017 05/14/16   Marshia Ly, PA-C     Family History Family History  Problem Relation Age of Onset  . Adopted: Yes    Social History Social History  Substance Use Topics  . Smoking status: Former Smoker    Quit date: 01/12/2013  . Smokeless tobacco: Never Used  . Alcohol use Yes     Comment: intermittent, unable to quantify     Allergies   Patient has no known allergies.   Review of Systems Review of Systems Ten systems are reviewed and are negative for acute change except as noted in the HPI   Physical Exam Updated Vital Signs BP (!) 32/92   Pulse 98   Temp 98.3 F (36.8 C) (Oral)   Resp 18   Ht 5\' 7"  (1.702 m)   Wt 103 lb (46.7 kg)   SpO2 96%   BMI 16.13 kg/m   Physical Exam  Constitutional: She is oriented to person, place, and time. She appears well-developed and well-nourished. No distress.  HENT:  Head: Normocephalic and atraumatic.  Nose: Nose normal.  Eyes: Conjunctivae and EOM are normal. Pupils are equal, round, and reactive to light. Right eye exhibits no discharge. Left eye exhibits no discharge. No scleral icterus.  Neck: Normal range of motion. Neck supple.  Cardiovascular: Normal rate and regular rhythm.  Exam reveals no gallop and no friction rub.   No murmur heard. Pulmonary/Chest: Effort normal and breath sounds normal. No stridor. No respiratory distress. She has no rales.  Abdominal: Soft. She exhibits no distension. There is no tenderness.  Musculoskeletal: She exhibits no edema.       Lumbar back: She exhibits tenderness. She exhibits no bony tenderness.       Back:  Neurological: She is alert and oriented to person, place, and time.  Spine Exam: Strength: 5/5 throughout LE bilaterally (hip flexion/extension, adduction/abduction; knee flexion/extension; foot dorsiflexion/plantarflexion, inversion/eversion; great toe inversion) Sensation: Intact to light touch in proximal and distal LE bilaterally Reflexes: 2+ quadriceps and achilles reflexes    Skin: Skin is warm and  dry. No rash noted. She is not diaphoretic. No erythema.  Psychiatric: She has a normal mood and affect.  Vitals reviewed.    ED Treatments / Results  Labs (all labs ordered are listed, but only abnormal results are displayed) Labs Reviewed - No data to display  EKG  EKG Interpretation None       Radiology No results found.  Procedures Procedures (including critical care time)  Medications Ordered in ED Medications  acetaminophen (TYLENOL) tablet 1,000 mg (1,000 mg Oral Given 01/12/17 2031)     Initial Impression / Assessment and Plan / ED Course  I have reviewed the triage vital signs and the nursing notes.  Pertinent labs & imaging results that were available during my care of the patient were reviewed by me and considered in my medical decision making (see chart for details).     Chronic lower back pain. No midline tenderness to palpation.  No signs or symptoms suggestive of cauda equina. No urinary symptoms suggestive of pyelonephritis. Patient is well-appearing, oriented 4. No evidence of opiate overdose. No indication for labs or imaging at this time. She is safe for discharge with strict return precautions.  Final Clinical Impressions(s) / ED Diagnoses   Final diagnoses:  Chronic midline low back pain without sciatica   Disposition: Discharge  Condition: Good  I have discussed the results, Dx and Tx plan with the patient who expressed understanding and agree(s) with the plan. Discharge instructions discussed at great length. The patient was given strict return precautions who verbalized understanding of the instructions. No further questions at time of discharge.    Discharge Medication List as of 01/12/2017  9:39 PM      Follow Up: Deatra JamesVyvyan Sun, MD [address] [place] [address] [phone]  Schedule an appointment as soon as possible for a visit  As needed      Nira ConnPedro Eduardo Demi Trieu, MD 01/14/17 [phone]

## 2017-01-12 NOTE — ED Notes (Signed)
Pt stable, understands discharge instructions, and reasons for return.   

## 2017-01-12 NOTE — ED Triage Notes (Signed)
Pt brought in by [facility]. Pt daughter called believing that pt had taken too many oxycodone. Pt denies taking any pain meds today. Pt Aox4. Pt c/o bilateral leg pain with rash and chronic low back pain.

## 2017-01-18 ENCOUNTER — Emergency Department (HOSPITAL_COMMUNITY)
Admission: EM | Admit: 2017-01-18 | Discharge: 2017-01-18 | Disposition: A | Discharge status: Home / Self Care | Payer: Medicare Other | Attending: Emergency Medicine | Admitting: Emergency Medicine

## 2017-01-18 ENCOUNTER — Encounter (HOSPITAL_COMMUNITY): Payer: Self-pay

## 2017-01-18 DIAGNOSIS — M5442 Lumbago with sciatica, left side: Secondary | ICD-10-CM | POA: Diagnosis not present

## 2017-01-18 DIAGNOSIS — M545 Low back pain: Secondary | ICD-10-CM | POA: Diagnosis not present

## 2017-01-18 DIAGNOSIS — Z96641 Presence of right artificial hip joint: Secondary | ICD-10-CM | POA: Insufficient documentation

## 2017-01-18 DIAGNOSIS — B028 Zoster with other complications: Secondary | ICD-10-CM | POA: Diagnosis not present

## 2017-01-18 DIAGNOSIS — Z87891 Personal history of nicotine dependence: Secondary | ICD-10-CM | POA: Insufficient documentation

## 2017-01-18 DIAGNOSIS — G8929 Other chronic pain: Secondary | ICD-10-CM | POA: Diagnosis not present

## 2017-01-18 DIAGNOSIS — Z7982 Long term (current) use of aspirin: Secondary | ICD-10-CM | POA: Diagnosis not present

## 2017-01-18 DIAGNOSIS — R21 Rash and other nonspecific skin eruption: Secondary | ICD-10-CM | POA: Diagnosis present

## 2017-01-18 DIAGNOSIS — L089 Local infection of the skin and subcutaneous tissue, unspecified: Secondary | ICD-10-CM | POA: Diagnosis not present

## 2017-01-18 MED ORDER — VALACYCLOVIR HCL 500 MG PO TABS: 1000.0000 mg | ORAL_TABLET | Freq: Once | ORAL | Status: DC

## 2017-01-18 MED ORDER — DOXYCYCLINE HYCLATE 100 MG PO CAPS: 100.0000 mg | ORAL_CAPSULE | Freq: Two times a day (BID) | ORAL | 0 refills | Status: AC

## 2017-01-18 MED ORDER — DOXYCYCLINE HYCLATE 100 MG PO TABS: 100.0000 mg | ORAL_TABLET | Freq: Once | ORAL | Status: DC

## 2017-01-18 MED ORDER — VALACYCLOVIR HCL 1 G PO TABS: 1000.0000 mg | ORAL_TABLET | Freq: Three times a day (TID) | ORAL | 0 refills | Status: AC

## 2017-01-18 MED ORDER — OXYCODONE-ACETAMINOPHEN 5-325 MG PO TABS: 1.0000 | ORAL_TABLET | Freq: Four times a day (QID) | ORAL | 0 refills | Status: DC | PRN

## 2017-01-18 MED ORDER — OXYCODONE-ACETAMINOPHEN 5-325 MG PO TABS
1.0000 | ORAL_TABLET | ORAL | Status: DC | PRN
Start: 2017-01-18 — End: 2017-01-18
  Administered 2017-01-18: 1 via ORAL
  Filled 2017-01-18: qty 1

## 2017-01-18 NOTE — Progress Notes (Addendum)
CSW received call from provider pt needs social work issues examined.  CSW met with pt and pt's friend Barrett Henle who stated pt was not allowed return to pt's [place] due to pt's inability to live without help in the home.  Pt stated her apartment manager stated pt was not allowed to return without a caregiver.  CSW called and spoke to pt's apartment manager Marinell Blight at ph: [phone] who stated she was actually the activites director and that there is no site manager working there currently.  Marinell Blight stated she felt the pt was asking for help from other residents and herself and that this was innapropriate. Tye Maryland also stated she felt pt was not safe while there alone also  Tye Maryland stated pt was allowed to return, that she just wanted the pt safe and did not want pt to be evicted.    CSW asked pt if pt would like the CSW to call DSS APS and pt said she would.  CSW stated to Marinell Blight he would call APS and provided her and the pt's friend Barrett Henle with the APS number also. Pt was discharged and returned to her home and was transported by pt's friend Barrett Henle.    3:58 PM on 3/20 CSW called [place] [facility] and filed an APS report with APS DSS agent Amy on 3/19. CSW signing off.   Alphonse Guild. Liticia Gasior, Latanya Presser, LCAS Clinical Social Worker Ph: [phone]

## 2017-01-18 NOTE — Discharge Instructions (Signed)
Medications: Valtrex, doxycycline, Percocet  Treatment: Take Valtrex 3 times daily for 7 days. Take doxycycline twice daily for 10 days. Take 1-2 Percocet every 4-6 hours as needed for severe pain. You can take ibuprofen as prescribed over-the-counter for mild to moderate pain. Use heat 3-4 times daily to your sore muscles in your back. Attempt the back stretches as tolerated.  Follow-up: Please follow-up with your primary care provider, Dr. Wynelle LinkSun, in 2-3 days for follow-up of today's visit. Please return to emergency department if you develop any new or worsening symptoms including fevers, worsening pain to your rash, intractable vomiting, or any other concerning symptoms.
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
## 2017-01-18 NOTE — ED Notes (Signed)
Social worker at bedside.

## 2017-01-18 NOTE — ED Notes (Signed)
Bed: [facility] Expected date:  Expected time:  Means of arrival:  Comments: 

## 2017-01-18 NOTE — ED Triage Notes (Signed)
Pt with back and buttocks pain.  Pt states painful and lesions noted x 2 weeks.  ? Shingles.

## 2017-01-18 NOTE — ED Notes (Signed)
ED Provider at bedside. 

## 2017-01-19 ENCOUNTER — Telehealth: Payer: Self-pay | Admitting: *Deleted

## 2017-01-19 NOTE — Telephone Encounter (Signed)
EDCM consulted to assist pt with finding PCP.  EDCM placed call to number listed on chart and it is invalid.  Will send pt a list of PCP accepting pt with insurance via mail.

## 2017-01-27 DIAGNOSIS — B029 Zoster without complications: Secondary | ICD-10-CM | POA: Diagnosis not present

## 2017-01-27 DIAGNOSIS — E441 Mild protein-calorie malnutrition: Secondary | ICD-10-CM | POA: Diagnosis not present

## 2017-01-27 DIAGNOSIS — L98491 Non-pressure chronic ulcer of skin of other sites limited to breakdown of skin: Secondary | ICD-10-CM | POA: Diagnosis not present

## 2017-01-27 DIAGNOSIS — F17210 Nicotine dependence, cigarettes, uncomplicated: Secondary | ICD-10-CM | POA: Diagnosis not present

## 2017-02-03 DIAGNOSIS — L98499 Non-pressure chronic ulcer of skin of other sites with unspecified severity: Secondary | ICD-10-CM | POA: Diagnosis not present

## 2017-02-03 DIAGNOSIS — L03116 Cellulitis of left lower limb: Secondary | ICD-10-CM | POA: Diagnosis not present

## 2017-02-03 DIAGNOSIS — B373 Candidiasis of vulva and vagina: Secondary | ICD-10-CM | POA: Diagnosis not present

## 2017-02-03 DIAGNOSIS — B028 Zoster with other complications: Secondary | ICD-10-CM | POA: Diagnosis not present

## 2017-02-03 DIAGNOSIS — M545 Low back pain: Secondary | ICD-10-CM | POA: Diagnosis not present

## 2017-02-03 DIAGNOSIS — B0229 Other postherpetic nervous system involvement: Secondary | ICD-10-CM | POA: Diagnosis not present

## 2017-02-03 DIAGNOSIS — R52 Pain, unspecified: Secondary | ICD-10-CM | POA: Diagnosis not present

## 2017-02-03 DIAGNOSIS — Z7409 Other reduced mobility: Secondary | ICD-10-CM | POA: Diagnosis not present

## 2017-02-03 DIAGNOSIS — R102 Pelvic and perineal pain: Secondary | ICD-10-CM | POA: Diagnosis not present

## 2017-02-03 DIAGNOSIS — E876 Hypokalemia: Secondary | ICD-10-CM | POA: Diagnosis not present

## 2017-02-03 DIAGNOSIS — B029 Zoster without complications: Secondary | ICD-10-CM | POA: Diagnosis not present

## 2017-02-03 DIAGNOSIS — R079 Chest pain, unspecified: Secondary | ICD-10-CM | POA: Diagnosis not present

## 2017-02-03 DIAGNOSIS — G8929 Other chronic pain: Secondary | ICD-10-CM | POA: Diagnosis not present

## 2017-02-03 DIAGNOSIS — M25552 Pain in left hip: Secondary | ICD-10-CM | POA: Diagnosis not present

## 2017-02-04 DIAGNOSIS — R5381 Other malaise: Secondary | ICD-10-CM | POA: Diagnosis not present

## 2017-02-04 DIAGNOSIS — B028 Zoster with other complications: Secondary | ICD-10-CM | POA: Diagnosis not present

## 2017-02-04 DIAGNOSIS — M545 Low back pain: Secondary | ICD-10-CM | POA: Diagnosis not present

## 2017-02-04 DIAGNOSIS — B0229 Other postherpetic nervous system involvement: Secondary | ICD-10-CM | POA: Diagnosis not present

## 2017-02-05 DIAGNOSIS — M545 Low back pain: Secondary | ICD-10-CM | POA: Diagnosis not present

## 2017-02-05 DIAGNOSIS — B028 Zoster with other complications: Secondary | ICD-10-CM | POA: Diagnosis not present

## 2017-02-05 DIAGNOSIS — R5381 Other malaise: Secondary | ICD-10-CM | POA: Diagnosis not present

## 2017-02-05 DIAGNOSIS — B0229 Other postherpetic nervous system involvement: Secondary | ICD-10-CM | POA: Diagnosis not present

## 2017-02-06 DIAGNOSIS — L03116 Cellulitis of left lower limb: Secondary | ICD-10-CM | POA: Diagnosis not present

## 2017-02-06 DIAGNOSIS — M545 Low back pain: Secondary | ICD-10-CM | POA: Diagnosis not present

## 2017-02-06 DIAGNOSIS — B0229 Other postherpetic nervous system involvement: Secondary | ICD-10-CM | POA: Diagnosis not present

## 2017-02-06 DIAGNOSIS — R300 Dysuria: Secondary | ICD-10-CM | POA: Diagnosis not present

## 2017-02-06 DIAGNOSIS — B373 Candidiasis of vulva and vagina: Secondary | ICD-10-CM | POA: Diagnosis not present

## 2017-02-06 DIAGNOSIS — R627 Adult failure to thrive: Secondary | ICD-10-CM | POA: Diagnosis not present

## 2017-02-06 DIAGNOSIS — B029 Zoster without complications: Secondary | ICD-10-CM | POA: Diagnosis not present

## 2017-02-06 DIAGNOSIS — Z7409 Other reduced mobility: Secondary | ICD-10-CM | POA: Diagnosis not present

## 2017-02-06 DIAGNOSIS — G8929 Other chronic pain: Secondary | ICD-10-CM | POA: Diagnosis not present

## 2017-02-06 DIAGNOSIS — E876 Hypokalemia: Secondary | ICD-10-CM | POA: Diagnosis not present

## 2017-02-06 DIAGNOSIS — F17200 Nicotine dependence, unspecified, uncomplicated: Secondary | ICD-10-CM | POA: Diagnosis not present

## 2017-02-06 DIAGNOSIS — G589 Mononeuropathy, unspecified: Secondary | ICD-10-CM | POA: Diagnosis not present

## 2017-02-06 DIAGNOSIS — L8990 Pressure ulcer of unspecified site, unspecified stage: Secondary | ICD-10-CM | POA: Diagnosis not present

## 2017-02-06 DIAGNOSIS — R2681 Unsteadiness on feet: Secondary | ICD-10-CM | POA: Diagnosis not present

## 2017-02-06 DIAGNOSIS — B999 Unspecified infectious disease: Secondary | ICD-10-CM | POA: Diagnosis not present

## 2017-02-06 DIAGNOSIS — M6281 Muscle weakness (generalized): Secondary | ICD-10-CM | POA: Diagnosis not present

## 2017-02-06 DIAGNOSIS — F329 Major depressive disorder, single episode, unspecified: Secondary | ICD-10-CM | POA: Diagnosis not present

## 2017-02-06 DIAGNOSIS — L98499 Non-pressure chronic ulcer of skin of other sites with unspecified severity: Secondary | ICD-10-CM | POA: Diagnosis not present

## 2017-02-06 DIAGNOSIS — G894 Chronic pain syndrome: Secondary | ICD-10-CM | POA: Diagnosis not present

## 2017-02-06 DIAGNOSIS — R5381 Other malaise: Secondary | ICD-10-CM | POA: Diagnosis not present

## 2017-02-06 DIAGNOSIS — L98491 Non-pressure chronic ulcer of skin of other sites limited to breakdown of skin: Secondary | ICD-10-CM | POA: Diagnosis not present

## 2017-02-06 DIAGNOSIS — B028 Zoster with other complications: Secondary | ICD-10-CM | POA: Diagnosis not present

## 2017-02-06 DIAGNOSIS — Z720 Tobacco use: Secondary | ICD-10-CM | POA: Diagnosis not present

## 2017-02-06 DIAGNOSIS — F418 Other specified anxiety disorders: Secondary | ICD-10-CM | POA: Diagnosis not present

## 2017-02-06 DIAGNOSIS — M25552 Pain in left hip: Secondary | ICD-10-CM | POA: Diagnosis not present

## 2017-02-08 DIAGNOSIS — G894 Chronic pain syndrome: Secondary | ICD-10-CM | POA: Diagnosis not present

## 2017-02-08 DIAGNOSIS — L8990 Pressure ulcer of unspecified site, unspecified stage: Secondary | ICD-10-CM | POA: Diagnosis not present

## 2017-02-08 DIAGNOSIS — B0229 Other postherpetic nervous system involvement: Secondary | ICD-10-CM | POA: Diagnosis not present

## 2017-02-08 DIAGNOSIS — R300 Dysuria: Secondary | ICD-10-CM | POA: Diagnosis not present

## 2017-02-09 DIAGNOSIS — L8990 Pressure ulcer of unspecified site, unspecified stage: Secondary | ICD-10-CM | POA: Diagnosis not present

## 2017-02-09 DIAGNOSIS — B0229 Other postherpetic nervous system involvement: Secondary | ICD-10-CM | POA: Diagnosis not present

## 2017-02-09 DIAGNOSIS — F329 Major depressive disorder, single episode, unspecified: Secondary | ICD-10-CM | POA: Diagnosis not present

## 2017-02-09 DIAGNOSIS — G894 Chronic pain syndrome: Secondary | ICD-10-CM | POA: Diagnosis not present

## 2017-02-15 DIAGNOSIS — G894 Chronic pain syndrome: Secondary | ICD-10-CM | POA: Diagnosis not present

## 2017-02-15 DIAGNOSIS — L8990 Pressure ulcer of unspecified site, unspecified stage: Secondary | ICD-10-CM | POA: Diagnosis not present

## 2017-02-15 DIAGNOSIS — R5381 Other malaise: Secondary | ICD-10-CM | POA: Diagnosis not present

## 2017-02-15 DIAGNOSIS — B0229 Other postherpetic nervous system involvement: Secondary | ICD-10-CM | POA: Diagnosis not present

## 2017-02-22 DIAGNOSIS — G894 Chronic pain syndrome: Secondary | ICD-10-CM | POA: Diagnosis not present

## 2017-02-22 DIAGNOSIS — F17200 Nicotine dependence, unspecified, uncomplicated: Secondary | ICD-10-CM | POA: Diagnosis not present

## 2017-02-22 DIAGNOSIS — F329 Major depressive disorder, single episode, unspecified: Secondary | ICD-10-CM | POA: Diagnosis not present

## 2017-02-22 DIAGNOSIS — L8990 Pressure ulcer of unspecified site, unspecified stage: Secondary | ICD-10-CM | POA: Diagnosis not present

## 2017-02-23 DIAGNOSIS — F329 Major depressive disorder, single episode, unspecified: Secondary | ICD-10-CM | POA: Diagnosis not present

## 2017-02-23 DIAGNOSIS — F418 Other specified anxiety disorders: Secondary | ICD-10-CM | POA: Diagnosis not present

## 2017-02-25 DIAGNOSIS — L8990 Pressure ulcer of unspecified site, unspecified stage: Secondary | ICD-10-CM | POA: Diagnosis not present

## 2017-02-25 DIAGNOSIS — G894 Chronic pain syndrome: Secondary | ICD-10-CM | POA: Diagnosis not present

## 2017-02-25 DIAGNOSIS — B0229 Other postherpetic nervous system involvement: Secondary | ICD-10-CM | POA: Diagnosis not present

## 2017-02-25 DIAGNOSIS — R5381 Other malaise: Secondary | ICD-10-CM | POA: Diagnosis not present

## 2017-02-28 DIAGNOSIS — B0229 Other postherpetic nervous system involvement: Secondary | ICD-10-CM | POA: Diagnosis not present

## 2017-02-28 DIAGNOSIS — G8929 Other chronic pain: Secondary | ICD-10-CM | POA: Diagnosis not present

## 2017-02-28 DIAGNOSIS — L98491 Non-pressure chronic ulcer of skin of other sites limited to breakdown of skin: Secondary | ICD-10-CM | POA: Diagnosis not present

## 2017-02-28 DIAGNOSIS — I10 Essential (primary) hypertension: Secondary | ICD-10-CM | POA: Diagnosis not present

## 2017-02-28 DIAGNOSIS — M1990 Unspecified osteoarthritis, unspecified site: Secondary | ICD-10-CM | POA: Diagnosis not present

## 2017-02-28 DIAGNOSIS — B028 Zoster with other complications: Secondary | ICD-10-CM | POA: Diagnosis not present

## 2017-03-03 DIAGNOSIS — B0223 Postherpetic polyneuropathy: Secondary | ICD-10-CM | POA: Diagnosis not present

## 2017-03-03 DIAGNOSIS — M5416 Radiculopathy, lumbar region: Secondary | ICD-10-CM | POA: Diagnosis not present

## 2017-03-03 DIAGNOSIS — G894 Chronic pain syndrome: Secondary | ICD-10-CM | POA: Diagnosis not present

## 2017-03-03 DIAGNOSIS — M545 Low back pain: Secondary | ICD-10-CM | POA: Diagnosis not present

## 2017-03-31 DIAGNOSIS — M545 Low back pain: Secondary | ICD-10-CM | POA: Diagnosis not present

## 2017-03-31 DIAGNOSIS — G894 Chronic pain syndrome: Secondary | ICD-10-CM | POA: Diagnosis not present

## 2017-04-28 DIAGNOSIS — M545 Low back pain: Secondary | ICD-10-CM | POA: Diagnosis not present

## 2017-04-28 DIAGNOSIS — Z79891 Long term (current) use of opiate analgesic: Secondary | ICD-10-CM | POA: Diagnosis not present

## 2017-04-28 DIAGNOSIS — G894 Chronic pain syndrome: Secondary | ICD-10-CM | POA: Diagnosis not present

## 2017-05-26 DIAGNOSIS — M545 Low back pain: Secondary | ICD-10-CM | POA: Diagnosis not present

## 2017-05-26 DIAGNOSIS — Z79891 Long term (current) use of opiate analgesic: Secondary | ICD-10-CM | POA: Diagnosis not present

## 2017-05-26 DIAGNOSIS — M542 Cervicalgia: Secondary | ICD-10-CM | POA: Diagnosis not present

## 2017-05-26 DIAGNOSIS — G894 Chronic pain syndrome: Secondary | ICD-10-CM | POA: Diagnosis not present

## 2017-06-23 DIAGNOSIS — Z79891 Long term (current) use of opiate analgesic: Secondary | ICD-10-CM | POA: Diagnosis not present

## 2017-06-23 DIAGNOSIS — G894 Chronic pain syndrome: Secondary | ICD-10-CM | POA: Diagnosis not present

## 2017-06-23 DIAGNOSIS — M545 Low back pain: Secondary | ICD-10-CM | POA: Diagnosis not present

## 2017-07-26 DIAGNOSIS — G894 Chronic pain syndrome: Secondary | ICD-10-CM | POA: Diagnosis not present

## 2017-07-26 DIAGNOSIS — M545 Low back pain: Secondary | ICD-10-CM | POA: Diagnosis not present

## 2017-07-26 DIAGNOSIS — Z79891 Long term (current) use of opiate analgesic: Secondary | ICD-10-CM | POA: Diagnosis not present

## 2017-08-23 DIAGNOSIS — M545 Low back pain: Secondary | ICD-10-CM | POA: Diagnosis not present

## 2017-08-23 DIAGNOSIS — G894 Chronic pain syndrome: Secondary | ICD-10-CM | POA: Diagnosis not present

## 2017-08-23 DIAGNOSIS — Z79891 Long term (current) use of opiate analgesic: Secondary | ICD-10-CM | POA: Diagnosis not present

## 2017-09-27 DIAGNOSIS — G894 Chronic pain syndrome: Secondary | ICD-10-CM | POA: Diagnosis not present

## 2017-09-27 DIAGNOSIS — Z79891 Long term (current) use of opiate analgesic: Secondary | ICD-10-CM | POA: Diagnosis not present

## 2017-09-27 DIAGNOSIS — M545 Low back pain: Secondary | ICD-10-CM | POA: Diagnosis not present

## 2017-10-29 DIAGNOSIS — Z79891 Long term (current) use of opiate analgesic: Secondary | ICD-10-CM | POA: Diagnosis not present

## 2017-10-29 DIAGNOSIS — M545 Low back pain: Secondary | ICD-10-CM | POA: Diagnosis not present

## 2017-10-29 DIAGNOSIS — G894 Chronic pain syndrome: Secondary | ICD-10-CM | POA: Diagnosis not present

## 2017-11-12 DIAGNOSIS — G8929 Other chronic pain: Secondary | ICD-10-CM | POA: Diagnosis not present

## 2017-11-12 DIAGNOSIS — G4700 Insomnia, unspecified: Secondary | ICD-10-CM | POA: Diagnosis not present

## 2017-11-12 DIAGNOSIS — B0229 Other postherpetic nervous system involvement: Secondary | ICD-10-CM | POA: Diagnosis not present

## 2017-11-12 DIAGNOSIS — R2681 Unsteadiness on feet: Secondary | ICD-10-CM | POA: Diagnosis not present

## 2017-11-26 DIAGNOSIS — Z79891 Long term (current) use of opiate analgesic: Secondary | ICD-10-CM | POA: Diagnosis not present

## 2017-11-26 DIAGNOSIS — M545 Low back pain: Secondary | ICD-10-CM | POA: Diagnosis not present

## 2017-11-26 DIAGNOSIS — G894 Chronic pain syndrome: Secondary | ICD-10-CM | POA: Diagnosis not present

## 2017-12-08 DIAGNOSIS — H02052 Trichiasis without entropian right lower eyelid: Secondary | ICD-10-CM | POA: Diagnosis not present

## 2017-12-24 DIAGNOSIS — Z79891 Long term (current) use of opiate analgesic: Secondary | ICD-10-CM | POA: Diagnosis not present

## 2017-12-24 DIAGNOSIS — M545 Low back pain: Secondary | ICD-10-CM | POA: Diagnosis not present

## 2017-12-24 DIAGNOSIS — G894 Chronic pain syndrome: Secondary | ICD-10-CM | POA: Diagnosis not present

## 2017-12-29 DIAGNOSIS — H2513 Age-related nuclear cataract, bilateral: Secondary | ICD-10-CM | POA: Diagnosis not present

## 2018-01-21 DIAGNOSIS — M545 Low back pain: Secondary | ICD-10-CM | POA: Diagnosis not present

## 2018-01-21 DIAGNOSIS — G894 Chronic pain syndrome: Secondary | ICD-10-CM | POA: Diagnosis not present

## 2018-01-21 DIAGNOSIS — Z79891 Long term (current) use of opiate analgesic: Secondary | ICD-10-CM | POA: Diagnosis not present

## 2018-02-22 DIAGNOSIS — Z79891 Long term (current) use of opiate analgesic: Secondary | ICD-10-CM | POA: Diagnosis not present

## 2018-02-22 DIAGNOSIS — M545 Low back pain: Secondary | ICD-10-CM | POA: Diagnosis not present

## 2018-02-22 DIAGNOSIS — G894 Chronic pain syndrome: Secondary | ICD-10-CM | POA: Diagnosis not present

## 2018-02-22 DIAGNOSIS — B0229 Other postherpetic nervous system involvement: Secondary | ICD-10-CM | POA: Diagnosis not present

## 2018-03-23 DIAGNOSIS — Z79891 Long term (current) use of opiate analgesic: Secondary | ICD-10-CM | POA: Diagnosis not present

## 2018-03-23 DIAGNOSIS — B0229 Other postherpetic nervous system involvement: Secondary | ICD-10-CM | POA: Diagnosis not present

## 2018-03-23 DIAGNOSIS — G894 Chronic pain syndrome: Secondary | ICD-10-CM | POA: Diagnosis not present

## 2018-03-23 DIAGNOSIS — M545 Low back pain: Secondary | ICD-10-CM | POA: Diagnosis not present

## 2018-04-05 DIAGNOSIS — G4700 Insomnia, unspecified: Secondary | ICD-10-CM | POA: Diagnosis not present

## 2018-04-05 DIAGNOSIS — B0229 Other postherpetic nervous system involvement: Secondary | ICD-10-CM | POA: Diagnosis not present

## 2018-04-05 DIAGNOSIS — M549 Dorsalgia, unspecified: Secondary | ICD-10-CM | POA: Diagnosis not present

## 2018-04-05 DIAGNOSIS — Z0000 Encounter for general adult medical examination without abnormal findings: Secondary | ICD-10-CM | POA: Diagnosis not present

## 2018-04-22 DIAGNOSIS — Z79891 Long term (current) use of opiate analgesic: Secondary | ICD-10-CM | POA: Diagnosis not present

## 2018-04-22 DIAGNOSIS — M545 Low back pain: Secondary | ICD-10-CM | POA: Diagnosis not present

## 2018-04-22 DIAGNOSIS — G894 Chronic pain syndrome: Secondary | ICD-10-CM | POA: Diagnosis not present

## 2018-04-22 DIAGNOSIS — B0229 Other postherpetic nervous system involvement: Secondary | ICD-10-CM | POA: Diagnosis not present

## 2018-05-25 DIAGNOSIS — M545 Low back pain: Secondary | ICD-10-CM | POA: Diagnosis not present

## 2018-05-25 DIAGNOSIS — Z79891 Long term (current) use of opiate analgesic: Secondary | ICD-10-CM | POA: Diagnosis not present

## 2018-05-25 DIAGNOSIS — B0229 Other postherpetic nervous system involvement: Secondary | ICD-10-CM | POA: Diagnosis not present

## 2018-05-25 DIAGNOSIS — G894 Chronic pain syndrome: Secondary | ICD-10-CM | POA: Diagnosis not present

## 2018-06-22 DIAGNOSIS — M545 Low back pain: Secondary | ICD-10-CM | POA: Diagnosis not present

## 2018-06-22 DIAGNOSIS — B0229 Other postherpetic nervous system involvement: Secondary | ICD-10-CM | POA: Diagnosis not present

## 2018-06-22 DIAGNOSIS — Z79891 Long term (current) use of opiate analgesic: Secondary | ICD-10-CM | POA: Diagnosis not present

## 2018-06-22 DIAGNOSIS — G894 Chronic pain syndrome: Secondary | ICD-10-CM | POA: Diagnosis not present

## 2018-07-22 DIAGNOSIS — G894 Chronic pain syndrome: Secondary | ICD-10-CM | POA: Diagnosis not present

## 2018-07-22 DIAGNOSIS — M545 Low back pain: Secondary | ICD-10-CM | POA: Diagnosis not present

## 2018-07-22 DIAGNOSIS — B0229 Other postherpetic nervous system involvement: Secondary | ICD-10-CM | POA: Diagnosis not present

## 2018-07-22 DIAGNOSIS — Z79891 Long term (current) use of opiate analgesic: Secondary | ICD-10-CM | POA: Diagnosis not present

## 2018-08-19 DIAGNOSIS — Z79891 Long term (current) use of opiate analgesic: Secondary | ICD-10-CM | POA: Diagnosis not present

## 2018-08-19 DIAGNOSIS — G894 Chronic pain syndrome: Secondary | ICD-10-CM | POA: Diagnosis not present

## 2018-08-19 DIAGNOSIS — M545 Low back pain: Secondary | ICD-10-CM | POA: Diagnosis not present

## 2018-08-19 DIAGNOSIS — B0229 Other postherpetic nervous system involvement: Secondary | ICD-10-CM | POA: Diagnosis not present

## 2018-09-14 DIAGNOSIS — B0229 Other postherpetic nervous system involvement: Secondary | ICD-10-CM | POA: Diagnosis not present

## 2018-09-14 DIAGNOSIS — Z79891 Long term (current) use of opiate analgesic: Secondary | ICD-10-CM | POA: Diagnosis not present

## 2018-09-14 DIAGNOSIS — G894 Chronic pain syndrome: Secondary | ICD-10-CM | POA: Diagnosis not present

## 2018-09-14 DIAGNOSIS — M545 Low back pain: Secondary | ICD-10-CM | POA: Diagnosis not present

## 2018-12-07 ENCOUNTER — Encounter (HOSPITAL_COMMUNITY): Payer: Self-pay

## 2018-12-07 ENCOUNTER — Other Ambulatory Visit: Payer: Self-pay

## 2018-12-07 ENCOUNTER — Emergency Department (HOSPITAL_COMMUNITY)
Admission: EM | Admit: 2018-12-07 | Discharge: 2018-12-07 | Disposition: A | Discharge status: Home / Self Care | Payer: Medicare Other | Attending: Emergency Medicine | Admitting: Emergency Medicine

## 2018-12-07 ENCOUNTER — Emergency Department (HOSPITAL_COMMUNITY): Payer: Medicare Other

## 2018-12-07 DIAGNOSIS — Y92410 Unspecified street and highway as the place of occurrence of the external cause: Secondary | ICD-10-CM | POA: Insufficient documentation

## 2018-12-07 DIAGNOSIS — Y939 Activity, unspecified: Secondary | ICD-10-CM | POA: Diagnosis not present

## 2018-12-07 DIAGNOSIS — Z87891 Personal history of nicotine dependence: Secondary | ICD-10-CM | POA: Diagnosis not present

## 2018-12-07 DIAGNOSIS — Y999 Unspecified external cause status: Secondary | ICD-10-CM | POA: Diagnosis not present

## 2018-12-07 DIAGNOSIS — Z96641 Presence of right artificial hip joint: Secondary | ICD-10-CM | POA: Insufficient documentation

## 2018-12-07 DIAGNOSIS — S92001A Unspecified fracture of right calcaneus, initial encounter for closed fracture: Secondary | ICD-10-CM | POA: Insufficient documentation

## 2018-12-07 DIAGNOSIS — Z79899 Other long term (current) drug therapy: Secondary | ICD-10-CM | POA: Diagnosis not present

## 2018-12-07 DIAGNOSIS — M25571 Pain in right ankle and joints of right foot: Secondary | ICD-10-CM | POA: Diagnosis not present

## 2018-12-07 DIAGNOSIS — R52 Pain, unspecified: Secondary | ICD-10-CM | POA: Diagnosis not present

## 2018-12-07 DIAGNOSIS — S99911A Unspecified injury of right ankle, initial encounter: Secondary | ICD-10-CM | POA: Diagnosis present

## 2018-12-07 LAB — BASIC METABOLIC PANEL
Anion gap: 7 (ref 5–15)
BUN: 24 mg/dL — ABNORMAL HIGH (ref 8–23)
CO2: 27 mmol/L (ref 22–32)
Calcium: 9 mg/dL (ref 8.9–10.3)
Chloride: 100 mmol/L (ref 98–111)
Creatinine, Ser: 0.54 mg/dL (ref 0.44–1.00)
GFR calc non Af Amer: >60 mL/min (ref >60)
Glucose, Bld: 96 mg/dL (ref 70–99)
Potassium: 4 mmol/L (ref 3.5–5.1)
Sodium: 134 mmol/L — ABNORMAL LOW (ref 135–145)

## 2018-12-07 LAB — CBC WITH DIFFERENTIAL/PLATELET
Abs Immature Granulocytes: 0.06 10*3/uL (ref 0.00–0.07)
BASOS ABS: 0.1 10*3/uL (ref 0.0–0.1)
Basophils Relative: 1 %
Eosinophils Absolute: 0.1 10*3/uL (ref 0.0–0.5)
Eosinophils Relative: 0 %
HCT: 39.7 % (ref 36.0–46.0)
Hemoglobin: 12.9 g/dL (ref 12.0–15.0)
Immature Granulocytes: 1 %
Lymphocytes Relative: 16 %
Lymphs Abs: 1.9 10*3/uL (ref 0.7–4.0)
MCH: 32 pg (ref 26.0–34.0)
MCHC: 32.5 g/dL (ref 30.0–36.0)
MCV: 98.5 fL (ref 80.0–100.0)
Monocytes Absolute: 1.4 10*3/uL — ABNORMAL HIGH (ref 0.1–1.0)
Monocytes Relative: 12 %
NRBC: 0 % (ref 0.0–0.2)
Neutro Abs: 8.4 10*3/uL — ABNORMAL HIGH (ref 1.7–7.7)
Neutrophils Relative %: 70 %
Platelets: 447 10*3/uL — ABNORMAL HIGH (ref 150–400)
RBC: 4.03 MIL/uL (ref 3.87–5.11)
RDW: 14.3 % (ref 11.5–15.5)
WBC: 12 10*3/uL — ABNORMAL HIGH (ref 4.0–10.5)

## 2018-12-07 MED ORDER — ONDANSETRON HCL 4 MG/2ML IJ SOLN
4.0000 mg | Freq: Once | INTRAMUSCULAR | Status: AC
  Administered 2018-12-07: 4 mg via INTRAVENOUS
  Filled 2018-12-07: qty 2

## 2018-12-07 MED ORDER — HYDROCODONE-ACETAMINOPHEN 5-325 MG PO TABS
1.0000 | ORAL_TABLET | Freq: Once | ORAL | Status: AC
  Administered 2018-12-07: 1 via ORAL
  Filled 2018-12-07: qty 1

## 2018-12-07 MED ORDER — OXYCODONE-ACETAMINOPHEN 5-325 MG PO TABS: 1.0000 | ORAL_TABLET | ORAL | 0 refills | Status: AC | PRN

## 2018-12-07 MED ORDER — MORPHINE SULFATE (PF) 4 MG/ML IV SOLN
4.0000 mg | Freq: Once | INTRAVENOUS | Status: AC
  Administered 2018-12-07: 4 mg via INTRAVENOUS
  Filled 2018-12-07: qty 1
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
## 2018-12-07 NOTE — Discharge Instructions (Signed)
Keep leg elevated.  Non weight bearing.

## 2018-12-07 NOTE — Care Management (Signed)
[facility] ED CM spoke with Dr. Particia Nearing EDP concerning patient at [facility] ED for [facility] recommendations. CM attempted to speak patient by phone, [facility] ED CM relayed message to RN concerning HH preferred list, and quality.. Patient is agreeable with receiving HH services and selects [facility].  Referral sent thru [facility] and list faxed.
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
## 2018-12-08 DIAGNOSIS — M79671 Pain in right foot: Secondary | ICD-10-CM | POA: Diagnosis not present

## 2018-12-08 DIAGNOSIS — S92011A Displaced fracture of body of right calcaneus, initial encounter for closed fracture: Secondary | ICD-10-CM | POA: Diagnosis not present

## 2018-12-10 ENCOUNTER — Other Ambulatory Visit: Payer: Self-pay | Admitting: Sports Medicine

## 2018-12-10 DIAGNOSIS — M79671 Pain in right foot: Secondary | ICD-10-CM

## 2018-12-16 ENCOUNTER — Other Ambulatory Visit: Payer: Medicare Other

## 2018-12-24 DIAGNOSIS — M545 Low back pain: Secondary | ICD-10-CM | POA: Diagnosis not present

## 2018-12-24 DIAGNOSIS — R627 Adult failure to thrive: Secondary | ICD-10-CM | POA: Diagnosis not present

## 2018-12-24 DIAGNOSIS — M810 Age-related osteoporosis without current pathological fracture: Secondary | ICD-10-CM | POA: Diagnosis not present

## 2018-12-24 DIAGNOSIS — G4700 Insomnia, unspecified: Secondary | ICD-10-CM | POA: Diagnosis not present

## 2018-12-24 DIAGNOSIS — R42 Dizziness and giddiness: Secondary | ICD-10-CM | POA: Diagnosis not present

## 2018-12-24 DIAGNOSIS — R0902 Hypoxemia: Secondary | ICD-10-CM | POA: Diagnosis not present

## 2018-12-24 DIAGNOSIS — L89621 Pressure ulcer of left heel, stage 1: Secondary | ICD-10-CM | POA: Diagnosis not present

## 2018-12-24 DIAGNOSIS — R05 Cough: Secondary | ICD-10-CM | POA: Diagnosis not present

## 2018-12-24 DIAGNOSIS — F329 Major depressive disorder, single episode, unspecified: Secondary | ICD-10-CM | POA: Diagnosis not present

## 2018-12-24 DIAGNOSIS — R262 Difficulty in walking, not elsewhere classified: Secondary | ICD-10-CM | POA: Diagnosis not present

## 2018-12-24 DIAGNOSIS — B029 Zoster without complications: Secondary | ICD-10-CM | POA: Diagnosis not present

## 2018-12-24 DIAGNOSIS — R0602 Shortness of breath: Secondary | ICD-10-CM | POA: Diagnosis not present

## 2018-12-24 DIAGNOSIS — G8929 Other chronic pain: Secondary | ICD-10-CM | POA: Diagnosis not present

## 2018-12-24 DIAGNOSIS — I959 Hypotension, unspecified: Secondary | ICD-10-CM | POA: Diagnosis not present

## 2018-12-24 DIAGNOSIS — G9340 Encephalopathy, unspecified: Secondary | ICD-10-CM | POA: Diagnosis not present

## 2018-12-24 DIAGNOSIS — R531 Weakness: Secondary | ICD-10-CM | POA: Diagnosis not present

## 2018-12-24 DIAGNOSIS — E440 Moderate protein-calorie malnutrition: Secondary | ICD-10-CM | POA: Diagnosis not present

## 2018-12-24 DIAGNOSIS — L89152 Pressure ulcer of sacral region, stage 2: Secondary | ICD-10-CM | POA: Diagnosis not present

## 2018-12-24 DIAGNOSIS — E876 Hypokalemia: Secondary | ICD-10-CM | POA: Diagnosis not present

## 2018-12-24 DIAGNOSIS — K5909 Other constipation: Secondary | ICD-10-CM | POA: Diagnosis not present

## 2018-12-24 DIAGNOSIS — E872 Acidosis: Secondary | ICD-10-CM | POA: Diagnosis not present

## 2018-12-24 DIAGNOSIS — S82891A Other fracture of right lower leg, initial encounter for closed fracture: Secondary | ICD-10-CM | POA: Diagnosis not present

## 2018-12-24 DIAGNOSIS — Z7409 Other reduced mobility: Secondary | ICD-10-CM | POA: Diagnosis not present

## 2018-12-25 DIAGNOSIS — L89152 Pressure ulcer of sacral region, stage 2: Secondary | ICD-10-CM | POA: Diagnosis not present

## 2018-12-25 DIAGNOSIS — S82891A Other fracture of right lower leg, initial encounter for closed fracture: Secondary | ICD-10-CM | POA: Diagnosis not present

## 2018-12-25 DIAGNOSIS — R262 Difficulty in walking, not elsewhere classified: Secondary | ICD-10-CM | POA: Diagnosis not present

## 2018-12-25 DIAGNOSIS — L89621 Pressure ulcer of left heel, stage 1: Secondary | ICD-10-CM | POA: Diagnosis not present

## 2018-12-26 DIAGNOSIS — S82891A Other fracture of right lower leg, initial encounter for closed fracture: Secondary | ICD-10-CM | POA: Diagnosis not present

## 2018-12-26 DIAGNOSIS — L89621 Pressure ulcer of left heel, stage 1: Secondary | ICD-10-CM | POA: Diagnosis not present

## 2018-12-26 DIAGNOSIS — L89152 Pressure ulcer of sacral region, stage 2: Secondary | ICD-10-CM | POA: Diagnosis not present

## 2018-12-26 DIAGNOSIS — R262 Difficulty in walking, not elsewhere classified: Secondary | ICD-10-CM | POA: Diagnosis not present

## 2018-12-27 DIAGNOSIS — G8929 Other chronic pain: Secondary | ICD-10-CM | POA: Diagnosis not present

## 2018-12-27 DIAGNOSIS — F329 Major depressive disorder, single episode, unspecified: Secondary | ICD-10-CM | POA: Diagnosis not present

## 2018-12-27 DIAGNOSIS — M810 Age-related osteoporosis without current pathological fracture: Secondary | ICD-10-CM | POA: Diagnosis not present

## 2018-12-27 DIAGNOSIS — R262 Difficulty in walking, not elsewhere classified: Secondary | ICD-10-CM | POA: Diagnosis not present

## 2018-12-28 DIAGNOSIS — Z743 Need for continuous supervision: Secondary | ICD-10-CM | POA: Diagnosis not present

## 2018-12-28 DIAGNOSIS — M810 Age-related osteoporosis without current pathological fracture: Secondary | ICD-10-CM | POA: Diagnosis not present

## 2018-12-28 DIAGNOSIS — R531 Weakness: Secondary | ICD-10-CM | POA: Diagnosis not present

## 2018-12-28 DIAGNOSIS — R42 Dizziness and giddiness: Secondary | ICD-10-CM | POA: Diagnosis not present

## 2018-12-28 DIAGNOSIS — Z7409 Other reduced mobility: Secondary | ICD-10-CM | POA: Diagnosis not present

## 2018-12-28 DIAGNOSIS — N390 Urinary tract infection, site not specified: Secondary | ICD-10-CM | POA: Diagnosis not present

## 2018-12-28 DIAGNOSIS — B029 Zoster without complications: Secondary | ICD-10-CM | POA: Diagnosis not present

## 2018-12-28 DIAGNOSIS — R2681 Unsteadiness on feet: Secondary | ICD-10-CM | POA: Diagnosis not present

## 2018-12-28 DIAGNOSIS — G4700 Insomnia, unspecified: Secondary | ICD-10-CM | POA: Diagnosis not present

## 2018-12-28 DIAGNOSIS — M6281 Muscle weakness (generalized): Secondary | ICD-10-CM | POA: Diagnosis not present

## 2018-12-28 DIAGNOSIS — R262 Difficulty in walking, not elsewhere classified: Secondary | ICD-10-CM | POA: Diagnosis not present

## 2018-12-28 DIAGNOSIS — F329 Major depressive disorder, single episode, unspecified: Secondary | ICD-10-CM | POA: Diagnosis not present

## 2018-12-28 DIAGNOSIS — F339 Major depressive disorder, recurrent, unspecified: Secondary | ICD-10-CM | POA: Diagnosis not present

## 2018-12-28 DIAGNOSIS — G8929 Other chronic pain: Secondary | ICD-10-CM | POA: Diagnosis not present

## 2018-12-28 DIAGNOSIS — M545 Low back pain: Secondary | ICD-10-CM | POA: Diagnosis not present

## 2018-12-28 DIAGNOSIS — R5381 Other malaise: Secondary | ICD-10-CM | POA: Diagnosis not present

## 2018-12-28 DIAGNOSIS — S82891D Other fracture of right lower leg, subsequent encounter for closed fracture with routine healing: Secondary | ICD-10-CM | POA: Diagnosis not present

## 2018-12-28 DIAGNOSIS — K5909 Other constipation: Secondary | ICD-10-CM | POA: Diagnosis not present

## 2018-12-28 DIAGNOSIS — L89152 Pressure ulcer of sacral region, stage 2: Secondary | ICD-10-CM | POA: Diagnosis not present

## 2018-12-28 DIAGNOSIS — E872 Acidosis: Secondary | ICD-10-CM | POA: Diagnosis not present

## 2018-12-28 DIAGNOSIS — R279 Unspecified lack of coordination: Secondary | ICD-10-CM | POA: Diagnosis not present

## 2018-12-28 DIAGNOSIS — E876 Hypokalemia: Secondary | ICD-10-CM | POA: Diagnosis not present

## 2018-12-28 DIAGNOSIS — E440 Moderate protein-calorie malnutrition: Secondary | ICD-10-CM | POA: Diagnosis not present

## 2018-12-28 DIAGNOSIS — G894 Chronic pain syndrome: Secondary | ICD-10-CM | POA: Diagnosis not present

## 2018-12-28 DIAGNOSIS — L89621 Pressure ulcer of left heel, stage 1: Secondary | ICD-10-CM | POA: Diagnosis not present

## 2018-12-28 DIAGNOSIS — S82891A Other fracture of right lower leg, initial encounter for closed fracture: Secondary | ICD-10-CM | POA: Diagnosis not present

## 2018-12-28 DIAGNOSIS — R627 Adult failure to thrive: Secondary | ICD-10-CM | POA: Diagnosis not present

## 2018-12-29 DIAGNOSIS — R5381 Other malaise: Secondary | ICD-10-CM | POA: Diagnosis not present

## 2018-12-29 DIAGNOSIS — S82891A Other fracture of right lower leg, initial encounter for closed fracture: Secondary | ICD-10-CM | POA: Diagnosis not present

## 2018-12-29 DIAGNOSIS — F329 Major depressive disorder, single episode, unspecified: Secondary | ICD-10-CM | POA: Diagnosis not present

## 2018-12-29 DIAGNOSIS — G4700 Insomnia, unspecified: Secondary | ICD-10-CM | POA: Diagnosis not present

## 2019-01-03 DIAGNOSIS — G894 Chronic pain syndrome: Secondary | ICD-10-CM | POA: Diagnosis not present

## 2019-01-03 DIAGNOSIS — F329 Major depressive disorder, single episode, unspecified: Secondary | ICD-10-CM | POA: Diagnosis not present

## 2019-01-03 DIAGNOSIS — G4700 Insomnia, unspecified: Secondary | ICD-10-CM | POA: Diagnosis not present

## 2019-01-03 DIAGNOSIS — S82891A Other fracture of right lower leg, initial encounter for closed fracture: Secondary | ICD-10-CM | POA: Diagnosis not present

## 2019-01-05 DIAGNOSIS — R5381 Other malaise: Secondary | ICD-10-CM | POA: Diagnosis not present

## 2019-01-05 DIAGNOSIS — F329 Major depressive disorder, single episode, unspecified: Secondary | ICD-10-CM | POA: Diagnosis not present

## 2019-01-05 DIAGNOSIS — G894 Chronic pain syndrome: Secondary | ICD-10-CM | POA: Diagnosis not present

## 2019-01-05 DIAGNOSIS — S82891A Other fracture of right lower leg, initial encounter for closed fracture: Secondary | ICD-10-CM | POA: Diagnosis not present

## 2019-01-12 DIAGNOSIS — R5381 Other malaise: Secondary | ICD-10-CM | POA: Diagnosis not present

## 2019-01-12 DIAGNOSIS — S82891A Other fracture of right lower leg, initial encounter for closed fracture: Secondary | ICD-10-CM | POA: Diagnosis not present

## 2019-01-12 DIAGNOSIS — G894 Chronic pain syndrome: Secondary | ICD-10-CM | POA: Diagnosis not present

## 2019-01-12 DIAGNOSIS — N390 Urinary tract infection, site not specified: Secondary | ICD-10-CM | POA: Diagnosis not present

## 2019-01-17 DIAGNOSIS — F329 Major depressive disorder, single episode, unspecified: Secondary | ICD-10-CM | POA: Diagnosis not present

## 2019-01-18 DIAGNOSIS — M79671 Pain in right foot: Secondary | ICD-10-CM | POA: Diagnosis not present

## 2019-01-18 DIAGNOSIS — S92011D Displaced fracture of body of right calcaneus, subsequent encounter for fracture with routine healing: Secondary | ICD-10-CM | POA: Diagnosis not present

## 2019-01-19 DIAGNOSIS — S82891A Other fracture of right lower leg, initial encounter for closed fracture: Secondary | ICD-10-CM | POA: Diagnosis not present

## 2019-01-19 DIAGNOSIS — M6281 Muscle weakness (generalized): Secondary | ICD-10-CM | POA: Diagnosis not present

## 2019-01-19 DIAGNOSIS — N390 Urinary tract infection, site not specified: Secondary | ICD-10-CM | POA: Diagnosis not present

## 2019-01-19 DIAGNOSIS — F329 Major depressive disorder, single episode, unspecified: Secondary | ICD-10-CM | POA: Diagnosis not present

## 2019-01-19 DIAGNOSIS — S82891D Other fracture of right lower leg, subsequent encounter for closed fracture with routine healing: Secondary | ICD-10-CM | POA: Diagnosis not present

## 2019-01-19 DIAGNOSIS — R627 Adult failure to thrive: Secondary | ICD-10-CM | POA: Diagnosis not present

## 2019-01-19 DIAGNOSIS — R2681 Unsteadiness on feet: Secondary | ICD-10-CM | POA: Diagnosis not present

## 2019-01-24 DIAGNOSIS — N390 Urinary tract infection, site not specified: Secondary | ICD-10-CM | POA: Diagnosis not present

## 2019-01-24 DIAGNOSIS — S82891A Other fracture of right lower leg, initial encounter for closed fracture: Secondary | ICD-10-CM | POA: Diagnosis not present

## 2019-01-24 DIAGNOSIS — G894 Chronic pain syndrome: Secondary | ICD-10-CM | POA: Diagnosis not present

## 2019-01-24 DIAGNOSIS — R627 Adult failure to thrive: Secondary | ICD-10-CM | POA: Diagnosis not present

## 2019-01-26 DIAGNOSIS — G4700 Insomnia, unspecified: Secondary | ICD-10-CM | POA: Diagnosis not present

## 2019-01-26 DIAGNOSIS — S82891A Other fracture of right lower leg, initial encounter for closed fracture: Secondary | ICD-10-CM | POA: Diagnosis not present

## 2019-01-26 DIAGNOSIS — M810 Age-related osteoporosis without current pathological fracture: Secondary | ICD-10-CM | POA: Diagnosis not present

## 2019-01-26 DIAGNOSIS — G894 Chronic pain syndrome: Secondary | ICD-10-CM | POA: Diagnosis not present

## 2019-02-01 DIAGNOSIS — M6281 Muscle weakness (generalized): Secondary | ICD-10-CM | POA: Diagnosis not present

## 2019-02-01 DIAGNOSIS — R2681 Unsteadiness on feet: Secondary | ICD-10-CM | POA: Diagnosis not present

## 2019-02-01 DIAGNOSIS — S82891D Other fracture of right lower leg, subsequent encounter for closed fracture with routine healing: Secondary | ICD-10-CM | POA: Diagnosis not present

## 2019-02-02 DIAGNOSIS — F330 Major depressive disorder, recurrent, mild: Secondary | ICD-10-CM | POA: Diagnosis not present

## 2019-02-02 DIAGNOSIS — M6281 Muscle weakness (generalized): Secondary | ICD-10-CM | POA: Diagnosis not present

## 2019-02-02 DIAGNOSIS — R2681 Unsteadiness on feet: Secondary | ICD-10-CM | POA: Diagnosis not present

## 2019-02-02 DIAGNOSIS — F418 Other specified anxiety disorders: Secondary | ICD-10-CM | POA: Diagnosis not present

## 2019-02-02 DIAGNOSIS — S82891D Other fracture of right lower leg, subsequent encounter for closed fracture with routine healing: Secondary | ICD-10-CM | POA: Diagnosis not present

## 2019-02-03 DIAGNOSIS — S82891D Other fracture of right lower leg, subsequent encounter for closed fracture with routine healing: Secondary | ICD-10-CM | POA: Diagnosis not present

## 2019-02-03 DIAGNOSIS — M6281 Muscle weakness (generalized): Secondary | ICD-10-CM | POA: Diagnosis not present

## 2019-02-03 DIAGNOSIS — R2681 Unsteadiness on feet: Secondary | ICD-10-CM | POA: Diagnosis not present

## 2019-02-04 DIAGNOSIS — M6281 Muscle weakness (generalized): Secondary | ICD-10-CM | POA: Diagnosis not present

## 2019-02-04 DIAGNOSIS — S82891D Other fracture of right lower leg, subsequent encounter for closed fracture with routine healing: Secondary | ICD-10-CM | POA: Diagnosis not present

## 2019-02-04 DIAGNOSIS — R2681 Unsteadiness on feet: Secondary | ICD-10-CM | POA: Diagnosis not present

## 2019-02-06 DIAGNOSIS — R2681 Unsteadiness on feet: Secondary | ICD-10-CM | POA: Diagnosis not present

## 2019-02-06 DIAGNOSIS — S82891D Other fracture of right lower leg, subsequent encounter for closed fracture with routine healing: Secondary | ICD-10-CM | POA: Diagnosis not present

## 2019-02-06 DIAGNOSIS — M6281 Muscle weakness (generalized): Secondary | ICD-10-CM | POA: Diagnosis not present

## 2019-02-07 DIAGNOSIS — S82891D Other fracture of right lower leg, subsequent encounter for closed fracture with routine healing: Secondary | ICD-10-CM | POA: Diagnosis not present

## 2019-02-07 DIAGNOSIS — M6281 Muscle weakness (generalized): Secondary | ICD-10-CM | POA: Diagnosis not present

## 2019-02-07 DIAGNOSIS — R2681 Unsteadiness on feet: Secondary | ICD-10-CM | POA: Diagnosis not present

## 2019-02-08 DIAGNOSIS — S82891D Other fracture of right lower leg, subsequent encounter for closed fracture with routine healing: Secondary | ICD-10-CM | POA: Diagnosis not present

## 2019-02-08 DIAGNOSIS — R2681 Unsteadiness on feet: Secondary | ICD-10-CM | POA: Diagnosis not present

## 2019-02-08 DIAGNOSIS — M6281 Muscle weakness (generalized): Secondary | ICD-10-CM | POA: Diagnosis not present

## 2019-02-09 DIAGNOSIS — S82891D Other fracture of right lower leg, subsequent encounter for closed fracture with routine healing: Secondary | ICD-10-CM | POA: Diagnosis not present

## 2019-02-09 DIAGNOSIS — R2681 Unsteadiness on feet: Secondary | ICD-10-CM | POA: Diagnosis not present

## 2019-02-09 DIAGNOSIS — M6281 Muscle weakness (generalized): Secondary | ICD-10-CM | POA: Diagnosis not present

## 2019-02-10 DIAGNOSIS — M6281 Muscle weakness (generalized): Secondary | ICD-10-CM | POA: Diagnosis not present

## 2019-02-10 DIAGNOSIS — F330 Major depressive disorder, recurrent, mild: Secondary | ICD-10-CM | POA: Diagnosis not present

## 2019-02-10 DIAGNOSIS — F418 Other specified anxiety disorders: Secondary | ICD-10-CM | POA: Diagnosis not present

## 2019-02-10 DIAGNOSIS — R5381 Other malaise: Secondary | ICD-10-CM | POA: Diagnosis not present

## 2019-02-10 DIAGNOSIS — R2681 Unsteadiness on feet: Secondary | ICD-10-CM | POA: Diagnosis not present

## 2019-02-10 DIAGNOSIS — S82891D Other fracture of right lower leg, subsequent encounter for closed fracture with routine healing: Secondary | ICD-10-CM | POA: Diagnosis not present

## 2019-02-11 DIAGNOSIS — M6281 Muscle weakness (generalized): Secondary | ICD-10-CM | POA: Diagnosis not present

## 2019-02-11 DIAGNOSIS — S82891D Other fracture of right lower leg, subsequent encounter for closed fracture with routine healing: Secondary | ICD-10-CM | POA: Diagnosis not present

## 2019-02-11 DIAGNOSIS — R2681 Unsteadiness on feet: Secondary | ICD-10-CM | POA: Diagnosis not present

## 2019-02-13 DIAGNOSIS — S82891D Other fracture of right lower leg, subsequent encounter for closed fracture with routine healing: Secondary | ICD-10-CM | POA: Diagnosis not present

## 2019-02-13 DIAGNOSIS — R2681 Unsteadiness on feet: Secondary | ICD-10-CM | POA: Diagnosis not present

## 2019-02-13 DIAGNOSIS — M6281 Muscle weakness (generalized): Secondary | ICD-10-CM | POA: Diagnosis not present

## 2019-02-14 DIAGNOSIS — R5381 Other malaise: Secondary | ICD-10-CM | POA: Diagnosis not present

## 2019-02-14 DIAGNOSIS — F330 Major depressive disorder, recurrent, mild: Secondary | ICD-10-CM | POA: Diagnosis not present

## 2019-02-14 DIAGNOSIS — S82891D Other fracture of right lower leg, subsequent encounter for closed fracture with routine healing: Secondary | ICD-10-CM | POA: Diagnosis not present

## 2019-02-14 DIAGNOSIS — G894 Chronic pain syndrome: Secondary | ICD-10-CM | POA: Diagnosis not present

## 2019-02-14 DIAGNOSIS — M6281 Muscle weakness (generalized): Secondary | ICD-10-CM | POA: Diagnosis not present

## 2019-02-14 DIAGNOSIS — F418 Other specified anxiety disorders: Secondary | ICD-10-CM | POA: Diagnosis not present

## 2019-02-14 DIAGNOSIS — R2681 Unsteadiness on feet: Secondary | ICD-10-CM | POA: Diagnosis not present

## 2019-02-15 DIAGNOSIS — S82891D Other fracture of right lower leg, subsequent encounter for closed fracture with routine healing: Secondary | ICD-10-CM | POA: Diagnosis not present

## 2019-02-15 DIAGNOSIS — R2681 Unsteadiness on feet: Secondary | ICD-10-CM | POA: Diagnosis not present

## 2019-02-15 DIAGNOSIS — M6281 Muscle weakness (generalized): Secondary | ICD-10-CM | POA: Diagnosis not present

## 2019-02-16 DIAGNOSIS — R2681 Unsteadiness on feet: Secondary | ICD-10-CM | POA: Diagnosis not present

## 2019-02-16 DIAGNOSIS — M6281 Muscle weakness (generalized): Secondary | ICD-10-CM | POA: Diagnosis not present

## 2019-02-16 DIAGNOSIS — S82891D Other fracture of right lower leg, subsequent encounter for closed fracture with routine healing: Secondary | ICD-10-CM | POA: Diagnosis not present

## 2019-02-17 DIAGNOSIS — M6281 Muscle weakness (generalized): Secondary | ICD-10-CM | POA: Diagnosis not present

## 2019-02-17 DIAGNOSIS — S82891D Other fracture of right lower leg, subsequent encounter for closed fracture with routine healing: Secondary | ICD-10-CM | POA: Diagnosis not present

## 2019-02-17 DIAGNOSIS — R2681 Unsteadiness on feet: Secondary | ICD-10-CM | POA: Diagnosis not present

## 2019-02-18 DIAGNOSIS — R2681 Unsteadiness on feet: Secondary | ICD-10-CM | POA: Diagnosis not present

## 2019-02-18 DIAGNOSIS — M6281 Muscle weakness (generalized): Secondary | ICD-10-CM | POA: Diagnosis not present

## 2019-02-18 DIAGNOSIS — S82891D Other fracture of right lower leg, subsequent encounter for closed fracture with routine healing: Secondary | ICD-10-CM | POA: Diagnosis not present

## 2019-02-20 DIAGNOSIS — M79671 Pain in right foot: Secondary | ICD-10-CM | POA: Diagnosis not present

## 2019-02-20 DIAGNOSIS — R2681 Unsteadiness on feet: Secondary | ICD-10-CM | POA: Diagnosis not present

## 2019-02-20 DIAGNOSIS — M6281 Muscle weakness (generalized): Secondary | ICD-10-CM | POA: Diagnosis not present

## 2019-02-20 DIAGNOSIS — S92011D Displaced fracture of body of right calcaneus, subsequent encounter for fracture with routine healing: Secondary | ICD-10-CM | POA: Diagnosis not present

## 2019-02-20 DIAGNOSIS — S82891D Other fracture of right lower leg, subsequent encounter for closed fracture with routine healing: Secondary | ICD-10-CM | POA: Diagnosis not present

## 2019-02-21 DIAGNOSIS — M6281 Muscle weakness (generalized): Secondary | ICD-10-CM | POA: Diagnosis not present

## 2019-02-21 DIAGNOSIS — R2681 Unsteadiness on feet: Secondary | ICD-10-CM | POA: Diagnosis not present

## 2019-02-21 DIAGNOSIS — S82891D Other fracture of right lower leg, subsequent encounter for closed fracture with routine healing: Secondary | ICD-10-CM | POA: Diagnosis not present

## 2019-02-22 DIAGNOSIS — R2681 Unsteadiness on feet: Secondary | ICD-10-CM | POA: Diagnosis not present

## 2019-02-22 DIAGNOSIS — S82891D Other fracture of right lower leg, subsequent encounter for closed fracture with routine healing: Secondary | ICD-10-CM | POA: Diagnosis not present

## 2019-02-22 DIAGNOSIS — M6281 Muscle weakness (generalized): Secondary | ICD-10-CM | POA: Diagnosis not present

## 2019-02-23 DIAGNOSIS — M6281 Muscle weakness (generalized): Secondary | ICD-10-CM | POA: Diagnosis not present

## 2019-02-23 DIAGNOSIS — G894 Chronic pain syndrome: Secondary | ICD-10-CM | POA: Diagnosis not present

## 2019-02-23 DIAGNOSIS — G4700 Insomnia, unspecified: Secondary | ICD-10-CM | POA: Diagnosis not present

## 2019-02-23 DIAGNOSIS — F330 Major depressive disorder, recurrent, mild: Secondary | ICD-10-CM | POA: Diagnosis not present

## 2019-02-23 DIAGNOSIS — S82891D Other fracture of right lower leg, subsequent encounter for closed fracture with routine healing: Secondary | ICD-10-CM | POA: Diagnosis not present

## 2019-02-23 DIAGNOSIS — R2681 Unsteadiness on feet: Secondary | ICD-10-CM | POA: Diagnosis not present

## 2019-02-23 DIAGNOSIS — R627 Adult failure to thrive: Secondary | ICD-10-CM | POA: Diagnosis not present

## 2019-02-24 DIAGNOSIS — R2681 Unsteadiness on feet: Secondary | ICD-10-CM | POA: Diagnosis not present

## 2019-02-24 DIAGNOSIS — M6281 Muscle weakness (generalized): Secondary | ICD-10-CM | POA: Diagnosis not present

## 2019-02-24 DIAGNOSIS — S82891D Other fracture of right lower leg, subsequent encounter for closed fracture with routine healing: Secondary | ICD-10-CM | POA: Diagnosis not present

## 2019-02-25 DIAGNOSIS — S82891D Other fracture of right lower leg, subsequent encounter for closed fracture with routine healing: Secondary | ICD-10-CM | POA: Diagnosis not present

## 2019-02-25 DIAGNOSIS — R2681 Unsteadiness on feet: Secondary | ICD-10-CM | POA: Diagnosis not present

## 2019-02-25 DIAGNOSIS — M6281 Muscle weakness (generalized): Secondary | ICD-10-CM | POA: Diagnosis not present

## 2019-02-27 DIAGNOSIS — G4700 Insomnia, unspecified: Secondary | ICD-10-CM | POA: Diagnosis not present

## 2019-02-27 DIAGNOSIS — S82891D Other fracture of right lower leg, subsequent encounter for closed fracture with routine healing: Secondary | ICD-10-CM | POA: Diagnosis not present

## 2019-02-27 DIAGNOSIS — F330 Major depressive disorder, recurrent, mild: Secondary | ICD-10-CM | POA: Diagnosis not present

## 2019-02-27 DIAGNOSIS — M6281 Muscle weakness (generalized): Secondary | ICD-10-CM | POA: Diagnosis not present

## 2019-02-27 DIAGNOSIS — R2681 Unsteadiness on feet: Secondary | ICD-10-CM | POA: Diagnosis not present

## 2019-02-28 DIAGNOSIS — M6281 Muscle weakness (generalized): Secondary | ICD-10-CM | POA: Diagnosis not present

## 2019-02-28 DIAGNOSIS — R2681 Unsteadiness on feet: Secondary | ICD-10-CM | POA: Diagnosis not present

## 2019-02-28 DIAGNOSIS — S82891D Other fracture of right lower leg, subsequent encounter for closed fracture with routine healing: Secondary | ICD-10-CM | POA: Diagnosis not present

## 2019-03-01 DIAGNOSIS — S82891D Other fracture of right lower leg, subsequent encounter for closed fracture with routine healing: Secondary | ICD-10-CM | POA: Diagnosis not present

## 2019-03-01 DIAGNOSIS — R2681 Unsteadiness on feet: Secondary | ICD-10-CM | POA: Diagnosis not present

## 2019-03-01 DIAGNOSIS — M6281 Muscle weakness (generalized): Secondary | ICD-10-CM | POA: Diagnosis not present

## 2019-03-03 DIAGNOSIS — F418 Other specified anxiety disorders: Secondary | ICD-10-CM | POA: Diagnosis not present

## 2019-03-03 DIAGNOSIS — F330 Major depressive disorder, recurrent, mild: Secondary | ICD-10-CM | POA: Diagnosis not present

## 2019-03-09 DIAGNOSIS — R262 Difficulty in walking, not elsewhere classified: Secondary | ICD-10-CM | POA: Diagnosis not present

## 2019-03-09 DIAGNOSIS — E559 Vitamin D deficiency, unspecified: Secondary | ICD-10-CM | POA: Diagnosis not present

## 2019-03-14 DIAGNOSIS — R627 Adult failure to thrive: Secondary | ICD-10-CM | POA: Diagnosis not present

## 2019-03-14 DIAGNOSIS — F330 Major depressive disorder, recurrent, mild: Secondary | ICD-10-CM | POA: Diagnosis not present

## 2019-03-14 DIAGNOSIS — G894 Chronic pain syndrome: Secondary | ICD-10-CM | POA: Diagnosis not present

## 2019-03-14 DIAGNOSIS — L8990 Pressure ulcer of unspecified site, unspecified stage: Secondary | ICD-10-CM | POA: Diagnosis not present

## 2019-03-20 DIAGNOSIS — S92011D Displaced fracture of body of right calcaneus, subsequent encounter for fracture with routine healing: Secondary | ICD-10-CM | POA: Diagnosis not present

## 2019-03-20 DIAGNOSIS — M79671 Pain in right foot: Secondary | ICD-10-CM | POA: Diagnosis not present

## 2019-03-21 DIAGNOSIS — M6281 Muscle weakness (generalized): Secondary | ICD-10-CM | POA: Diagnosis not present

## 2019-03-21 DIAGNOSIS — R2681 Unsteadiness on feet: Secondary | ICD-10-CM | POA: Diagnosis not present

## 2019-03-21 DIAGNOSIS — R531 Weakness: Secondary | ICD-10-CM | POA: Diagnosis not present

## 2019-03-21 DIAGNOSIS — R627 Adult failure to thrive: Secondary | ICD-10-CM | POA: Diagnosis not present

## 2019-03-21 DIAGNOSIS — G8929 Other chronic pain: Secondary | ICD-10-CM | POA: Diagnosis not present

## 2019-03-21 DIAGNOSIS — L89621 Pressure ulcer of left heel, stage 1: Secondary | ICD-10-CM | POA: Diagnosis not present

## 2019-03-21 DIAGNOSIS — S82891D Other fracture of right lower leg, subsequent encounter for closed fracture with routine healing: Secondary | ICD-10-CM | POA: Diagnosis not present

## 2019-03-21 DIAGNOSIS — L89152 Pressure ulcer of sacral region, stage 2: Secondary | ICD-10-CM | POA: Diagnosis not present

## 2019-03-21 DIAGNOSIS — F339 Major depressive disorder, recurrent, unspecified: Secondary | ICD-10-CM | POA: Diagnosis not present

## 2019-03-22 DIAGNOSIS — R627 Adult failure to thrive: Secondary | ICD-10-CM | POA: Diagnosis not present

## 2019-03-22 DIAGNOSIS — R531 Weakness: Secondary | ICD-10-CM | POA: Diagnosis not present

## 2019-03-22 DIAGNOSIS — R2681 Unsteadiness on feet: Secondary | ICD-10-CM | POA: Diagnosis not present

## 2019-03-22 DIAGNOSIS — F339 Major depressive disorder, recurrent, unspecified: Secondary | ICD-10-CM | POA: Diagnosis not present

## 2019-03-22 DIAGNOSIS — L89152 Pressure ulcer of sacral region, stage 2: Secondary | ICD-10-CM | POA: Diagnosis not present

## 2019-03-22 DIAGNOSIS — M6281 Muscle weakness (generalized): Secondary | ICD-10-CM | POA: Diagnosis not present

## 2019-03-22 DIAGNOSIS — G8929 Other chronic pain: Secondary | ICD-10-CM | POA: Diagnosis not present

## 2019-03-22 DIAGNOSIS — S82891D Other fracture of right lower leg, subsequent encounter for closed fracture with routine healing: Secondary | ICD-10-CM | POA: Diagnosis not present

## 2019-03-22 DIAGNOSIS — L89621 Pressure ulcer of left heel, stage 1: Secondary | ICD-10-CM | POA: Diagnosis not present

## 2019-03-23 DIAGNOSIS — R2681 Unsteadiness on feet: Secondary | ICD-10-CM | POA: Diagnosis not present

## 2019-03-23 DIAGNOSIS — R627 Adult failure to thrive: Secondary | ICD-10-CM | POA: Diagnosis not present

## 2019-03-23 DIAGNOSIS — G4700 Insomnia, unspecified: Secondary | ICD-10-CM | POA: Diagnosis not present

## 2019-03-23 DIAGNOSIS — R531 Weakness: Secondary | ICD-10-CM | POA: Diagnosis not present

## 2019-03-23 DIAGNOSIS — F330 Major depressive disorder, recurrent, mild: Secondary | ICD-10-CM | POA: Diagnosis not present

## 2019-03-23 DIAGNOSIS — L89621 Pressure ulcer of left heel, stage 1: Secondary | ICD-10-CM | POA: Diagnosis not present

## 2019-03-23 DIAGNOSIS — F339 Major depressive disorder, recurrent, unspecified: Secondary | ICD-10-CM | POA: Diagnosis not present

## 2019-03-23 DIAGNOSIS — S82891D Other fracture of right lower leg, subsequent encounter for closed fracture with routine healing: Secondary | ICD-10-CM | POA: Diagnosis not present

## 2019-03-23 DIAGNOSIS — L89152 Pressure ulcer of sacral region, stage 2: Secondary | ICD-10-CM | POA: Diagnosis not present

## 2019-03-23 DIAGNOSIS — M6281 Muscle weakness (generalized): Secondary | ICD-10-CM | POA: Diagnosis not present

## 2019-03-23 DIAGNOSIS — G8929 Other chronic pain: Secondary | ICD-10-CM | POA: Diagnosis not present

## 2019-03-23 DIAGNOSIS — G894 Chronic pain syndrome: Secondary | ICD-10-CM | POA: Diagnosis not present

## 2019-03-24 DIAGNOSIS — S82891D Other fracture of right lower leg, subsequent encounter for closed fracture with routine healing: Secondary | ICD-10-CM | POA: Diagnosis not present

## 2019-03-24 DIAGNOSIS — L89152 Pressure ulcer of sacral region, stage 2: Secondary | ICD-10-CM | POA: Diagnosis not present

## 2019-03-24 DIAGNOSIS — M6281 Muscle weakness (generalized): Secondary | ICD-10-CM | POA: Diagnosis not present

## 2019-03-24 DIAGNOSIS — R2681 Unsteadiness on feet: Secondary | ICD-10-CM | POA: Diagnosis not present

## 2019-03-24 DIAGNOSIS — F339 Major depressive disorder, recurrent, unspecified: Secondary | ICD-10-CM | POA: Diagnosis not present

## 2019-03-24 DIAGNOSIS — L89621 Pressure ulcer of left heel, stage 1: Secondary | ICD-10-CM | POA: Diagnosis not present

## 2019-03-24 DIAGNOSIS — R531 Weakness: Secondary | ICD-10-CM | POA: Diagnosis not present

## 2019-03-24 DIAGNOSIS — F330 Major depressive disorder, recurrent, mild: Secondary | ICD-10-CM | POA: Diagnosis not present

## 2019-03-24 DIAGNOSIS — R627 Adult failure to thrive: Secondary | ICD-10-CM | POA: Diagnosis not present

## 2019-03-24 DIAGNOSIS — G8929 Other chronic pain: Secondary | ICD-10-CM | POA: Diagnosis not present

## 2019-03-24 DIAGNOSIS — F418 Other specified anxiety disorders: Secondary | ICD-10-CM | POA: Diagnosis not present

## 2019-03-27 DIAGNOSIS — L89152 Pressure ulcer of sacral region, stage 2: Secondary | ICD-10-CM | POA: Diagnosis not present

## 2019-03-27 DIAGNOSIS — S82891D Other fracture of right lower leg, subsequent encounter for closed fracture with routine healing: Secondary | ICD-10-CM | POA: Diagnosis not present

## 2019-03-27 DIAGNOSIS — F339 Major depressive disorder, recurrent, unspecified: Secondary | ICD-10-CM | POA: Diagnosis not present

## 2019-03-27 DIAGNOSIS — G8929 Other chronic pain: Secondary | ICD-10-CM | POA: Diagnosis not present

## 2019-03-27 DIAGNOSIS — R627 Adult failure to thrive: Secondary | ICD-10-CM | POA: Diagnosis not present

## 2019-03-27 DIAGNOSIS — R531 Weakness: Secondary | ICD-10-CM | POA: Diagnosis not present

## 2019-03-27 DIAGNOSIS — R2681 Unsteadiness on feet: Secondary | ICD-10-CM | POA: Diagnosis not present

## 2019-03-27 DIAGNOSIS — L89621 Pressure ulcer of left heel, stage 1: Secondary | ICD-10-CM | POA: Diagnosis not present

## 2019-03-27 DIAGNOSIS — M6281 Muscle weakness (generalized): Secondary | ICD-10-CM | POA: Diagnosis not present

## 2019-03-28 DIAGNOSIS — G8929 Other chronic pain: Secondary | ICD-10-CM | POA: Diagnosis not present

## 2019-03-28 DIAGNOSIS — M6281 Muscle weakness (generalized): Secondary | ICD-10-CM | POA: Diagnosis not present

## 2019-03-28 DIAGNOSIS — S82891D Other fracture of right lower leg, subsequent encounter for closed fracture with routine healing: Secondary | ICD-10-CM | POA: Diagnosis not present

## 2019-03-28 DIAGNOSIS — L89621 Pressure ulcer of left heel, stage 1: Secondary | ICD-10-CM | POA: Diagnosis not present

## 2019-03-28 DIAGNOSIS — L89152 Pressure ulcer of sacral region, stage 2: Secondary | ICD-10-CM | POA: Diagnosis not present

## 2019-03-28 DIAGNOSIS — R531 Weakness: Secondary | ICD-10-CM | POA: Diagnosis not present

## 2019-03-28 DIAGNOSIS — R2681 Unsteadiness on feet: Secondary | ICD-10-CM | POA: Diagnosis not present

## 2019-03-28 DIAGNOSIS — F339 Major depressive disorder, recurrent, unspecified: Secondary | ICD-10-CM | POA: Diagnosis not present

## 2019-03-28 DIAGNOSIS — R627 Adult failure to thrive: Secondary | ICD-10-CM | POA: Diagnosis not present

## 2019-03-29 DIAGNOSIS — R531 Weakness: Secondary | ICD-10-CM | POA: Diagnosis not present

## 2019-03-29 DIAGNOSIS — R2681 Unsteadiness on feet: Secondary | ICD-10-CM | POA: Diagnosis not present

## 2019-03-29 DIAGNOSIS — F330 Major depressive disorder, recurrent, mild: Secondary | ICD-10-CM | POA: Diagnosis not present

## 2019-03-29 DIAGNOSIS — R627 Adult failure to thrive: Secondary | ICD-10-CM | POA: Diagnosis not present

## 2019-03-29 DIAGNOSIS — M6281 Muscle weakness (generalized): Secondary | ICD-10-CM | POA: Diagnosis not present

## 2019-03-29 DIAGNOSIS — S82891D Other fracture of right lower leg, subsequent encounter for closed fracture with routine healing: Secondary | ICD-10-CM | POA: Diagnosis not present

## 2019-03-29 DIAGNOSIS — G8929 Other chronic pain: Secondary | ICD-10-CM | POA: Diagnosis not present

## 2019-03-29 DIAGNOSIS — L89152 Pressure ulcer of sacral region, stage 2: Secondary | ICD-10-CM | POA: Diagnosis not present

## 2019-03-29 DIAGNOSIS — F418 Other specified anxiety disorders: Secondary | ICD-10-CM | POA: Diagnosis not present

## 2019-03-29 DIAGNOSIS — L89621 Pressure ulcer of left heel, stage 1: Secondary | ICD-10-CM | POA: Diagnosis not present

## 2019-03-29 DIAGNOSIS — F339 Major depressive disorder, recurrent, unspecified: Secondary | ICD-10-CM | POA: Diagnosis not present

## 2019-03-30 DIAGNOSIS — G8929 Other chronic pain: Secondary | ICD-10-CM | POA: Diagnosis not present

## 2019-03-30 DIAGNOSIS — L89621 Pressure ulcer of left heel, stage 1: Secondary | ICD-10-CM | POA: Diagnosis not present

## 2019-03-30 DIAGNOSIS — M6281 Muscle weakness (generalized): Secondary | ICD-10-CM | POA: Diagnosis not present

## 2019-03-30 DIAGNOSIS — R627 Adult failure to thrive: Secondary | ICD-10-CM | POA: Diagnosis not present

## 2019-03-30 DIAGNOSIS — R2681 Unsteadiness on feet: Secondary | ICD-10-CM | POA: Diagnosis not present

## 2019-03-30 DIAGNOSIS — F339 Major depressive disorder, recurrent, unspecified: Secondary | ICD-10-CM | POA: Diagnosis not present

## 2019-03-30 DIAGNOSIS — R531 Weakness: Secondary | ICD-10-CM | POA: Diagnosis not present

## 2019-03-30 DIAGNOSIS — L89152 Pressure ulcer of sacral region, stage 2: Secondary | ICD-10-CM | POA: Diagnosis not present

## 2019-03-30 DIAGNOSIS — S82891D Other fracture of right lower leg, subsequent encounter for closed fracture with routine healing: Secondary | ICD-10-CM | POA: Diagnosis not present

## 2019-03-31 DIAGNOSIS — M6281 Muscle weakness (generalized): Secondary | ICD-10-CM | POA: Diagnosis not present

## 2019-03-31 DIAGNOSIS — L89621 Pressure ulcer of left heel, stage 1: Secondary | ICD-10-CM | POA: Diagnosis not present

## 2019-03-31 DIAGNOSIS — G8929 Other chronic pain: Secondary | ICD-10-CM | POA: Diagnosis not present

## 2019-03-31 DIAGNOSIS — S82891D Other fracture of right lower leg, subsequent encounter for closed fracture with routine healing: Secondary | ICD-10-CM | POA: Diagnosis not present

## 2019-03-31 DIAGNOSIS — R2681 Unsteadiness on feet: Secondary | ICD-10-CM | POA: Diagnosis not present

## 2019-03-31 DIAGNOSIS — R627 Adult failure to thrive: Secondary | ICD-10-CM | POA: Diagnosis not present

## 2019-03-31 DIAGNOSIS — R531 Weakness: Secondary | ICD-10-CM | POA: Diagnosis not present

## 2019-03-31 DIAGNOSIS — F339 Major depressive disorder, recurrent, unspecified: Secondary | ICD-10-CM | POA: Diagnosis not present

## 2019-03-31 DIAGNOSIS — L89152 Pressure ulcer of sacral region, stage 2: Secondary | ICD-10-CM | POA: Diagnosis not present

## 2019-04-03 DIAGNOSIS — S82891D Other fracture of right lower leg, subsequent encounter for closed fracture with routine healing: Secondary | ICD-10-CM | POA: Diagnosis not present

## 2019-04-03 DIAGNOSIS — R531 Weakness: Secondary | ICD-10-CM | POA: Diagnosis not present

## 2019-04-03 DIAGNOSIS — R627 Adult failure to thrive: Secondary | ICD-10-CM | POA: Diagnosis not present

## 2019-04-03 DIAGNOSIS — L89621 Pressure ulcer of left heel, stage 1: Secondary | ICD-10-CM | POA: Diagnosis not present

## 2019-04-03 DIAGNOSIS — F339 Major depressive disorder, recurrent, unspecified: Secondary | ICD-10-CM | POA: Diagnosis not present

## 2019-04-03 DIAGNOSIS — R2681 Unsteadiness on feet: Secondary | ICD-10-CM | POA: Diagnosis not present

## 2019-04-03 DIAGNOSIS — L89152 Pressure ulcer of sacral region, stage 2: Secondary | ICD-10-CM | POA: Diagnosis not present

## 2019-04-04 DIAGNOSIS — L89621 Pressure ulcer of left heel, stage 1: Secondary | ICD-10-CM | POA: Diagnosis not present

## 2019-04-04 DIAGNOSIS — F330 Major depressive disorder, recurrent, mild: Secondary | ICD-10-CM | POA: Diagnosis not present

## 2019-04-04 DIAGNOSIS — F339 Major depressive disorder, recurrent, unspecified: Secondary | ICD-10-CM | POA: Diagnosis not present

## 2019-04-04 DIAGNOSIS — L89152 Pressure ulcer of sacral region, stage 2: Secondary | ICD-10-CM | POA: Diagnosis not present

## 2019-04-04 DIAGNOSIS — F418 Other specified anxiety disorders: Secondary | ICD-10-CM | POA: Diagnosis not present

## 2019-04-04 DIAGNOSIS — R2681 Unsteadiness on feet: Secondary | ICD-10-CM | POA: Diagnosis not present

## 2019-04-04 DIAGNOSIS — S82891D Other fracture of right lower leg, subsequent encounter for closed fracture with routine healing: Secondary | ICD-10-CM | POA: Diagnosis not present

## 2019-04-04 DIAGNOSIS — R627 Adult failure to thrive: Secondary | ICD-10-CM | POA: Diagnosis not present

## 2019-04-04 DIAGNOSIS — R531 Weakness: Secondary | ICD-10-CM | POA: Diagnosis not present

## 2019-04-05 DIAGNOSIS — L89621 Pressure ulcer of left heel, stage 1: Secondary | ICD-10-CM | POA: Diagnosis not present

## 2019-04-05 DIAGNOSIS — R2681 Unsteadiness on feet: Secondary | ICD-10-CM | POA: Diagnosis not present

## 2019-04-05 DIAGNOSIS — R627 Adult failure to thrive: Secondary | ICD-10-CM | POA: Diagnosis not present

## 2019-04-05 DIAGNOSIS — L89152 Pressure ulcer of sacral region, stage 2: Secondary | ICD-10-CM | POA: Diagnosis not present

## 2019-04-05 DIAGNOSIS — F339 Major depressive disorder, recurrent, unspecified: Secondary | ICD-10-CM | POA: Diagnosis not present

## 2019-04-05 DIAGNOSIS — S82891D Other fracture of right lower leg, subsequent encounter for closed fracture with routine healing: Secondary | ICD-10-CM | POA: Diagnosis not present

## 2019-04-05 DIAGNOSIS — R531 Weakness: Secondary | ICD-10-CM | POA: Diagnosis not present

## 2019-04-06 DIAGNOSIS — R2681 Unsteadiness on feet: Secondary | ICD-10-CM | POA: Diagnosis not present

## 2019-04-06 DIAGNOSIS — R531 Weakness: Secondary | ICD-10-CM | POA: Diagnosis not present

## 2019-04-06 DIAGNOSIS — L89152 Pressure ulcer of sacral region, stage 2: Secondary | ICD-10-CM | POA: Diagnosis not present

## 2019-04-06 DIAGNOSIS — R627 Adult failure to thrive: Secondary | ICD-10-CM | POA: Diagnosis not present

## 2019-04-06 DIAGNOSIS — F339 Major depressive disorder, recurrent, unspecified: Secondary | ICD-10-CM | POA: Diagnosis not present

## 2019-04-06 DIAGNOSIS — S82891D Other fracture of right lower leg, subsequent encounter for closed fracture with routine healing: Secondary | ICD-10-CM | POA: Diagnosis not present

## 2019-04-06 DIAGNOSIS — L89621 Pressure ulcer of left heel, stage 1: Secondary | ICD-10-CM | POA: Diagnosis not present

## 2019-04-07 DIAGNOSIS — L89621 Pressure ulcer of left heel, stage 1: Secondary | ICD-10-CM | POA: Diagnosis not present

## 2019-04-07 DIAGNOSIS — S82891D Other fracture of right lower leg, subsequent encounter for closed fracture with routine healing: Secondary | ICD-10-CM | POA: Diagnosis not present

## 2019-04-07 DIAGNOSIS — R531 Weakness: Secondary | ICD-10-CM | POA: Diagnosis not present

## 2019-04-07 DIAGNOSIS — L89152 Pressure ulcer of sacral region, stage 2: Secondary | ICD-10-CM | POA: Diagnosis not present

## 2019-04-07 DIAGNOSIS — F339 Major depressive disorder, recurrent, unspecified: Secondary | ICD-10-CM | POA: Diagnosis not present

## 2019-04-07 DIAGNOSIS — R2681 Unsteadiness on feet: Secondary | ICD-10-CM | POA: Diagnosis not present

## 2019-04-07 DIAGNOSIS — R627 Adult failure to thrive: Secondary | ICD-10-CM | POA: Diagnosis not present

## 2019-04-11 DIAGNOSIS — F330 Major depressive disorder, recurrent, mild: Secondary | ICD-10-CM | POA: Diagnosis not present

## 2019-04-11 DIAGNOSIS — F418 Other specified anxiety disorders: Secondary | ICD-10-CM | POA: Diagnosis not present

## 2019-04-11 DIAGNOSIS — S82891A Other fracture of right lower leg, initial encounter for closed fracture: Secondary | ICD-10-CM | POA: Diagnosis not present

## 2019-04-11 DIAGNOSIS — G894 Chronic pain syndrome: Secondary | ICD-10-CM | POA: Diagnosis not present

## 2019-04-12 DIAGNOSIS — B0229 Other postherpetic nervous system involvement: Secondary | ICD-10-CM | POA: Diagnosis not present

## 2019-04-12 DIAGNOSIS — M545 Low back pain: Secondary | ICD-10-CM | POA: Diagnosis not present

## 2019-04-12 DIAGNOSIS — G894 Chronic pain syndrome: Secondary | ICD-10-CM | POA: Diagnosis not present

## 2019-04-12 DIAGNOSIS — Z79891 Long term (current) use of opiate analgesic: Secondary | ICD-10-CM | POA: Diagnosis not present

## 2019-04-13 DIAGNOSIS — F330 Major depressive disorder, recurrent, mild: Secondary | ICD-10-CM | POA: Diagnosis not present

## 2019-04-13 DIAGNOSIS — F418 Other specified anxiety disorders: Secondary | ICD-10-CM | POA: Diagnosis not present

## 2019-04-20 DIAGNOSIS — R5381 Other malaise: Secondary | ICD-10-CM | POA: Diagnosis not present

## 2019-04-20 DIAGNOSIS — G4700 Insomnia, unspecified: Secondary | ICD-10-CM | POA: Diagnosis not present

## 2019-04-20 DIAGNOSIS — G894 Chronic pain syndrome: Secondary | ICD-10-CM | POA: Diagnosis not present

## 2019-04-20 DIAGNOSIS — B0229 Other postherpetic nervous system involvement: Secondary | ICD-10-CM | POA: Diagnosis not present

## 2019-04-21 DIAGNOSIS — S92011D Displaced fracture of body of right calcaneus, subsequent encounter for fracture with routine healing: Secondary | ICD-10-CM | POA: Diagnosis not present

## 2019-04-21 DIAGNOSIS — M79671 Pain in right foot: Secondary | ICD-10-CM | POA: Diagnosis not present

## 2019-04-27 DIAGNOSIS — F418 Other specified anxiety disorders: Secondary | ICD-10-CM | POA: Diagnosis not present

## 2019-04-27 DIAGNOSIS — F330 Major depressive disorder, recurrent, mild: Secondary | ICD-10-CM | POA: Diagnosis not present

## 2019-05-08 DIAGNOSIS — F330 Major depressive disorder, recurrent, mild: Secondary | ICD-10-CM | POA: Diagnosis not present

## 2019-05-08 DIAGNOSIS — G4700 Insomnia, unspecified: Secondary | ICD-10-CM | POA: Diagnosis not present

## 2019-05-09 DIAGNOSIS — R5381 Other malaise: Secondary | ICD-10-CM | POA: Diagnosis not present

## 2019-05-09 DIAGNOSIS — M810 Age-related osteoporosis without current pathological fracture: Secondary | ICD-10-CM | POA: Diagnosis not present

## 2019-05-09 DIAGNOSIS — G894 Chronic pain syndrome: Secondary | ICD-10-CM | POA: Diagnosis not present

## 2019-05-09 DIAGNOSIS — K5900 Constipation, unspecified: Secondary | ICD-10-CM | POA: Diagnosis not present

## 2019-05-12 DIAGNOSIS — M545 Low back pain: Secondary | ICD-10-CM | POA: Diagnosis not present

## 2019-05-12 DIAGNOSIS — B0229 Other postherpetic nervous system involvement: Secondary | ICD-10-CM | POA: Diagnosis not present

## 2019-05-12 DIAGNOSIS — F411 Generalized anxiety disorder: Secondary | ICD-10-CM | POA: Diagnosis not present

## 2019-05-12 DIAGNOSIS — F330 Major depressive disorder, recurrent, mild: Secondary | ICD-10-CM | POA: Diagnosis not present

## 2019-05-12 DIAGNOSIS — Z79891 Long term (current) use of opiate analgesic: Secondary | ICD-10-CM | POA: Diagnosis not present

## 2019-05-12 DIAGNOSIS — G894 Chronic pain syndrome: Secondary | ICD-10-CM | POA: Diagnosis not present

## 2019-05-18 DIAGNOSIS — G4700 Insomnia, unspecified: Secondary | ICD-10-CM | POA: Diagnosis not present

## 2019-05-18 DIAGNOSIS — R627 Adult failure to thrive: Secondary | ICD-10-CM | POA: Diagnosis not present

## 2019-05-18 DIAGNOSIS — G894 Chronic pain syndrome: Secondary | ICD-10-CM | POA: Diagnosis not present

## 2019-05-18 DIAGNOSIS — R5381 Other malaise: Secondary | ICD-10-CM | POA: Diagnosis not present

## 2019-05-19 DIAGNOSIS — F330 Major depressive disorder, recurrent, mild: Secondary | ICD-10-CM | POA: Diagnosis not present

## 2019-05-19 DIAGNOSIS — F418 Other specified anxiety disorders: Secondary | ICD-10-CM | POA: Diagnosis not present

## 2019-05-25 DIAGNOSIS — F411 Generalized anxiety disorder: Secondary | ICD-10-CM | POA: Diagnosis not present

## 2019-05-25 DIAGNOSIS — F330 Major depressive disorder, recurrent, mild: Secondary | ICD-10-CM | POA: Diagnosis not present

## 2019-06-06 DIAGNOSIS — F411 Generalized anxiety disorder: Secondary | ICD-10-CM | POA: Diagnosis not present

## 2019-06-06 DIAGNOSIS — F330 Major depressive disorder, recurrent, mild: Secondary | ICD-10-CM | POA: Diagnosis not present

## 2019-06-06 DIAGNOSIS — G894 Chronic pain syndrome: Secondary | ICD-10-CM | POA: Diagnosis not present

## 2019-06-07 DIAGNOSIS — Z79891 Long term (current) use of opiate analgesic: Secondary | ICD-10-CM | POA: Diagnosis not present

## 2019-06-07 DIAGNOSIS — M545 Low back pain: Secondary | ICD-10-CM | POA: Diagnosis not present

## 2019-06-07 DIAGNOSIS — B0229 Other postherpetic nervous system involvement: Secondary | ICD-10-CM | POA: Diagnosis not present

## 2019-06-07 DIAGNOSIS — G894 Chronic pain syndrome: Secondary | ICD-10-CM | POA: Diagnosis not present

## 2019-06-13 DIAGNOSIS — F411 Generalized anxiety disorder: Secondary | ICD-10-CM | POA: Diagnosis not present

## 2019-06-13 DIAGNOSIS — F418 Other specified anxiety disorders: Secondary | ICD-10-CM | POA: Diagnosis not present

## 2019-06-21 DIAGNOSIS — G4700 Insomnia, unspecified: Secondary | ICD-10-CM | POA: Diagnosis not present

## 2019-06-21 DIAGNOSIS — B0229 Other postherpetic nervous system involvement: Secondary | ICD-10-CM | POA: Diagnosis not present

## 2019-06-21 DIAGNOSIS — G894 Chronic pain syndrome: Secondary | ICD-10-CM | POA: Diagnosis not present

## 2019-06-21 DIAGNOSIS — F411 Generalized anxiety disorder: Secondary | ICD-10-CM | POA: Diagnosis not present

## 2019-06-22 DIAGNOSIS — R262 Difficulty in walking, not elsewhere classified: Secondary | ICD-10-CM | POA: Diagnosis not present

## 2019-06-22 DIAGNOSIS — M6281 Muscle weakness (generalized): Secondary | ICD-10-CM | POA: Diagnosis not present

## 2019-06-22 DIAGNOSIS — R531 Weakness: Secondary | ICD-10-CM | POA: Diagnosis not present

## 2019-06-23 DIAGNOSIS — R531 Weakness: Secondary | ICD-10-CM | POA: Diagnosis not present

## 2019-06-23 DIAGNOSIS — M6281 Muscle weakness (generalized): Secondary | ICD-10-CM | POA: Diagnosis not present

## 2019-06-23 DIAGNOSIS — R262 Difficulty in walking, not elsewhere classified: Secondary | ICD-10-CM | POA: Diagnosis not present

## 2019-06-26 DIAGNOSIS — R531 Weakness: Secondary | ICD-10-CM | POA: Diagnosis not present

## 2019-06-26 DIAGNOSIS — R262 Difficulty in walking, not elsewhere classified: Secondary | ICD-10-CM | POA: Diagnosis not present

## 2019-06-26 DIAGNOSIS — M6281 Muscle weakness (generalized): Secondary | ICD-10-CM | POA: Diagnosis not present

## 2019-06-27 DIAGNOSIS — M6281 Muscle weakness (generalized): Secondary | ICD-10-CM | POA: Diagnosis not present

## 2019-06-27 DIAGNOSIS — R262 Difficulty in walking, not elsewhere classified: Secondary | ICD-10-CM | POA: Diagnosis not present

## 2019-06-27 DIAGNOSIS — R531 Weakness: Secondary | ICD-10-CM | POA: Diagnosis not present

## 2019-06-28 DIAGNOSIS — R531 Weakness: Secondary | ICD-10-CM | POA: Diagnosis not present

## 2019-06-28 DIAGNOSIS — F411 Generalized anxiety disorder: Secondary | ICD-10-CM | POA: Diagnosis not present

## 2019-06-28 DIAGNOSIS — R262 Difficulty in walking, not elsewhere classified: Secondary | ICD-10-CM | POA: Diagnosis not present

## 2019-06-28 DIAGNOSIS — M6281 Muscle weakness (generalized): Secondary | ICD-10-CM | POA: Diagnosis not present

## 2019-06-29 DIAGNOSIS — M6281 Muscle weakness (generalized): Secondary | ICD-10-CM | POA: Diagnosis not present

## 2019-06-29 DIAGNOSIS — R262 Difficulty in walking, not elsewhere classified: Secondary | ICD-10-CM | POA: Diagnosis not present

## 2019-06-29 DIAGNOSIS — R531 Weakness: Secondary | ICD-10-CM | POA: Diagnosis not present

## 2019-06-30 DIAGNOSIS — R531 Weakness: Secondary | ICD-10-CM | POA: Diagnosis not present

## 2019-06-30 DIAGNOSIS — U071 COVID-19: Secondary | ICD-10-CM | POA: Diagnosis not present

## 2019-06-30 DIAGNOSIS — M6281 Muscle weakness (generalized): Secondary | ICD-10-CM | POA: Diagnosis not present

## 2019-06-30 DIAGNOSIS — R262 Difficulty in walking, not elsewhere classified: Secondary | ICD-10-CM | POA: Diagnosis not present

## 2019-07-02 DIAGNOSIS — R531 Weakness: Secondary | ICD-10-CM | POA: Diagnosis not present

## 2019-07-02 DIAGNOSIS — R262 Difficulty in walking, not elsewhere classified: Secondary | ICD-10-CM | POA: Diagnosis not present

## 2019-07-02 DIAGNOSIS — M6281 Muscle weakness (generalized): Secondary | ICD-10-CM | POA: Diagnosis not present

## 2019-07-03 DIAGNOSIS — R531 Weakness: Secondary | ICD-10-CM | POA: Diagnosis not present

## 2019-07-03 DIAGNOSIS — R262 Difficulty in walking, not elsewhere classified: Secondary | ICD-10-CM | POA: Diagnosis not present

## 2019-07-03 DIAGNOSIS — M6281 Muscle weakness (generalized): Secondary | ICD-10-CM | POA: Diagnosis not present

## 2019-07-04 DIAGNOSIS — F339 Major depressive disorder, recurrent, unspecified: Secondary | ICD-10-CM | POA: Diagnosis not present

## 2019-07-04 DIAGNOSIS — R627 Adult failure to thrive: Secondary | ICD-10-CM | POA: Diagnosis not present

## 2019-07-04 DIAGNOSIS — G8929 Other chronic pain: Secondary | ICD-10-CM | POA: Diagnosis not present

## 2019-07-04 DIAGNOSIS — G894 Chronic pain syndrome: Secondary | ICD-10-CM | POA: Diagnosis not present

## 2019-07-04 DIAGNOSIS — F330 Major depressive disorder, recurrent, mild: Secondary | ICD-10-CM | POA: Diagnosis not present

## 2019-07-04 DIAGNOSIS — S82891D Other fracture of right lower leg, subsequent encounter for closed fracture with routine healing: Secondary | ICD-10-CM | POA: Diagnosis not present

## 2019-07-04 DIAGNOSIS — R5381 Other malaise: Secondary | ICD-10-CM | POA: Diagnosis not present

## 2019-07-04 DIAGNOSIS — L89152 Pressure ulcer of sacral region, stage 2: Secondary | ICD-10-CM | POA: Diagnosis not present

## 2019-07-04 DIAGNOSIS — R262 Difficulty in walking, not elsewhere classified: Secondary | ICD-10-CM | POA: Diagnosis not present

## 2019-07-04 DIAGNOSIS — R531 Weakness: Secondary | ICD-10-CM | POA: Diagnosis not present

## 2019-07-04 DIAGNOSIS — L89621 Pressure ulcer of left heel, stage 1: Secondary | ICD-10-CM | POA: Diagnosis not present

## 2019-07-04 DIAGNOSIS — M6281 Muscle weakness (generalized): Secondary | ICD-10-CM | POA: Diagnosis not present

## 2019-07-05 DIAGNOSIS — F339 Major depressive disorder, recurrent, unspecified: Secondary | ICD-10-CM | POA: Diagnosis not present

## 2019-07-05 DIAGNOSIS — L89621 Pressure ulcer of left heel, stage 1: Secondary | ICD-10-CM | POA: Diagnosis not present

## 2019-07-05 DIAGNOSIS — R627 Adult failure to thrive: Secondary | ICD-10-CM | POA: Diagnosis not present

## 2019-07-05 DIAGNOSIS — Z79891 Long term (current) use of opiate analgesic: Secondary | ICD-10-CM | POA: Diagnosis not present

## 2019-07-05 DIAGNOSIS — S82891D Other fracture of right lower leg, subsequent encounter for closed fracture with routine healing: Secondary | ICD-10-CM | POA: Diagnosis not present

## 2019-07-05 DIAGNOSIS — M545 Low back pain: Secondary | ICD-10-CM | POA: Diagnosis not present

## 2019-07-05 DIAGNOSIS — G8929 Other chronic pain: Secondary | ICD-10-CM | POA: Diagnosis not present

## 2019-07-05 DIAGNOSIS — R262 Difficulty in walking, not elsewhere classified: Secondary | ICD-10-CM | POA: Diagnosis not present

## 2019-07-05 DIAGNOSIS — B0229 Other postherpetic nervous system involvement: Secondary | ICD-10-CM | POA: Diagnosis not present

## 2019-07-05 DIAGNOSIS — L89152 Pressure ulcer of sacral region, stage 2: Secondary | ICD-10-CM | POA: Diagnosis not present

## 2019-07-05 DIAGNOSIS — R531 Weakness: Secondary | ICD-10-CM | POA: Diagnosis not present

## 2019-07-05 DIAGNOSIS — G894 Chronic pain syndrome: Secondary | ICD-10-CM | POA: Diagnosis not present

## 2019-07-05 DIAGNOSIS — M6281 Muscle weakness (generalized): Secondary | ICD-10-CM | POA: Diagnosis not present

## 2019-07-06 DIAGNOSIS — F339 Major depressive disorder, recurrent, unspecified: Secondary | ICD-10-CM | POA: Diagnosis not present

## 2019-07-06 DIAGNOSIS — G8929 Other chronic pain: Secondary | ICD-10-CM | POA: Diagnosis not present

## 2019-07-06 DIAGNOSIS — L89152 Pressure ulcer of sacral region, stage 2: Secondary | ICD-10-CM | POA: Diagnosis not present

## 2019-07-06 DIAGNOSIS — R262 Difficulty in walking, not elsewhere classified: Secondary | ICD-10-CM | POA: Diagnosis not present

## 2019-07-06 DIAGNOSIS — R627 Adult failure to thrive: Secondary | ICD-10-CM | POA: Diagnosis not present

## 2019-07-06 DIAGNOSIS — M6281 Muscle weakness (generalized): Secondary | ICD-10-CM | POA: Diagnosis not present

## 2019-07-06 DIAGNOSIS — S82891D Other fracture of right lower leg, subsequent encounter for closed fracture with routine healing: Secondary | ICD-10-CM | POA: Diagnosis not present

## 2019-07-06 DIAGNOSIS — L89621 Pressure ulcer of left heel, stage 1: Secondary | ICD-10-CM | POA: Diagnosis not present

## 2019-07-06 DIAGNOSIS — R531 Weakness: Secondary | ICD-10-CM | POA: Diagnosis not present

## 2019-07-07 DIAGNOSIS — R262 Difficulty in walking, not elsewhere classified: Secondary | ICD-10-CM | POA: Diagnosis not present

## 2019-07-07 DIAGNOSIS — M6281 Muscle weakness (generalized): Secondary | ICD-10-CM | POA: Diagnosis not present

## 2019-07-07 DIAGNOSIS — L89152 Pressure ulcer of sacral region, stage 2: Secondary | ICD-10-CM | POA: Diagnosis not present

## 2019-07-07 DIAGNOSIS — R627 Adult failure to thrive: Secondary | ICD-10-CM | POA: Diagnosis not present

## 2019-07-07 DIAGNOSIS — R531 Weakness: Secondary | ICD-10-CM | POA: Diagnosis not present

## 2019-07-07 DIAGNOSIS — F339 Major depressive disorder, recurrent, unspecified: Secondary | ICD-10-CM | POA: Diagnosis not present

## 2019-07-07 DIAGNOSIS — S82891D Other fracture of right lower leg, subsequent encounter for closed fracture with routine healing: Secondary | ICD-10-CM | POA: Diagnosis not present

## 2019-07-07 DIAGNOSIS — G8929 Other chronic pain: Secondary | ICD-10-CM | POA: Diagnosis not present

## 2019-07-07 DIAGNOSIS — L89621 Pressure ulcer of left heel, stage 1: Secondary | ICD-10-CM | POA: Diagnosis not present

## 2019-07-08 DIAGNOSIS — R627 Adult failure to thrive: Secondary | ICD-10-CM | POA: Diagnosis not present

## 2019-07-08 DIAGNOSIS — L89621 Pressure ulcer of left heel, stage 1: Secondary | ICD-10-CM | POA: Diagnosis not present

## 2019-07-08 DIAGNOSIS — F339 Major depressive disorder, recurrent, unspecified: Secondary | ICD-10-CM | POA: Diagnosis not present

## 2019-07-08 DIAGNOSIS — R531 Weakness: Secondary | ICD-10-CM | POA: Diagnosis not present

## 2019-07-08 DIAGNOSIS — G8929 Other chronic pain: Secondary | ICD-10-CM | POA: Diagnosis not present

## 2019-07-08 DIAGNOSIS — S82891D Other fracture of right lower leg, subsequent encounter for closed fracture with routine healing: Secondary | ICD-10-CM | POA: Diagnosis not present

## 2019-07-08 DIAGNOSIS — M6281 Muscle weakness (generalized): Secondary | ICD-10-CM | POA: Diagnosis not present

## 2019-07-08 DIAGNOSIS — R262 Difficulty in walking, not elsewhere classified: Secondary | ICD-10-CM | POA: Diagnosis not present

## 2019-07-08 DIAGNOSIS — L89152 Pressure ulcer of sacral region, stage 2: Secondary | ICD-10-CM | POA: Diagnosis not present

## 2019-07-10 DIAGNOSIS — R627 Adult failure to thrive: Secondary | ICD-10-CM | POA: Diagnosis not present

## 2019-07-10 DIAGNOSIS — L89152 Pressure ulcer of sacral region, stage 2: Secondary | ICD-10-CM | POA: Diagnosis not present

## 2019-07-10 DIAGNOSIS — G8929 Other chronic pain: Secondary | ICD-10-CM | POA: Diagnosis not present

## 2019-07-10 DIAGNOSIS — S82891D Other fracture of right lower leg, subsequent encounter for closed fracture with routine healing: Secondary | ICD-10-CM | POA: Diagnosis not present

## 2019-07-10 DIAGNOSIS — L89621 Pressure ulcer of left heel, stage 1: Secondary | ICD-10-CM | POA: Diagnosis not present

## 2019-07-10 DIAGNOSIS — F339 Major depressive disorder, recurrent, unspecified: Secondary | ICD-10-CM | POA: Diagnosis not present

## 2019-07-10 DIAGNOSIS — M6281 Muscle weakness (generalized): Secondary | ICD-10-CM | POA: Diagnosis not present

## 2019-07-10 DIAGNOSIS — R531 Weakness: Secondary | ICD-10-CM | POA: Diagnosis not present

## 2019-07-10 DIAGNOSIS — R262 Difficulty in walking, not elsewhere classified: Secondary | ICD-10-CM | POA: Diagnosis not present

## 2019-07-11 DIAGNOSIS — G8929 Other chronic pain: Secondary | ICD-10-CM | POA: Diagnosis not present

## 2019-07-11 DIAGNOSIS — F339 Major depressive disorder, recurrent, unspecified: Secondary | ICD-10-CM | POA: Diagnosis not present

## 2019-07-11 DIAGNOSIS — R262 Difficulty in walking, not elsewhere classified: Secondary | ICD-10-CM | POA: Diagnosis not present

## 2019-07-11 DIAGNOSIS — S82891D Other fracture of right lower leg, subsequent encounter for closed fracture with routine healing: Secondary | ICD-10-CM | POA: Diagnosis not present

## 2019-07-11 DIAGNOSIS — L89152 Pressure ulcer of sacral region, stage 2: Secondary | ICD-10-CM | POA: Diagnosis not present

## 2019-07-11 DIAGNOSIS — R627 Adult failure to thrive: Secondary | ICD-10-CM | POA: Diagnosis not present

## 2019-07-11 DIAGNOSIS — L89621 Pressure ulcer of left heel, stage 1: Secondary | ICD-10-CM | POA: Diagnosis not present

## 2019-07-11 DIAGNOSIS — M6281 Muscle weakness (generalized): Secondary | ICD-10-CM | POA: Diagnosis not present

## 2019-07-11 DIAGNOSIS — R531 Weakness: Secondary | ICD-10-CM | POA: Diagnosis not present

## 2019-07-12 DIAGNOSIS — M6281 Muscle weakness (generalized): Secondary | ICD-10-CM | POA: Diagnosis not present

## 2019-07-12 DIAGNOSIS — R531 Weakness: Secondary | ICD-10-CM | POA: Diagnosis not present

## 2019-07-12 DIAGNOSIS — F418 Other specified anxiety disorders: Secondary | ICD-10-CM | POA: Diagnosis not present

## 2019-07-12 DIAGNOSIS — R262 Difficulty in walking, not elsewhere classified: Secondary | ICD-10-CM | POA: Diagnosis not present

## 2019-07-12 DIAGNOSIS — L89621 Pressure ulcer of left heel, stage 1: Secondary | ICD-10-CM | POA: Diagnosis not present

## 2019-07-12 DIAGNOSIS — S82891D Other fracture of right lower leg, subsequent encounter for closed fracture with routine healing: Secondary | ICD-10-CM | POA: Diagnosis not present

## 2019-07-12 DIAGNOSIS — G8929 Other chronic pain: Secondary | ICD-10-CM | POA: Diagnosis not present

## 2019-07-12 DIAGNOSIS — F339 Major depressive disorder, recurrent, unspecified: Secondary | ICD-10-CM | POA: Diagnosis not present

## 2019-07-12 DIAGNOSIS — F411 Generalized anxiety disorder: Secondary | ICD-10-CM | POA: Diagnosis not present

## 2019-07-12 DIAGNOSIS — L89152 Pressure ulcer of sacral region, stage 2: Secondary | ICD-10-CM | POA: Diagnosis not present

## 2019-07-12 DIAGNOSIS — R627 Adult failure to thrive: Secondary | ICD-10-CM | POA: Diagnosis not present

## 2019-07-13 DIAGNOSIS — G8929 Other chronic pain: Secondary | ICD-10-CM | POA: Diagnosis not present

## 2019-07-13 DIAGNOSIS — L89152 Pressure ulcer of sacral region, stage 2: Secondary | ICD-10-CM | POA: Diagnosis not present

## 2019-07-13 DIAGNOSIS — F339 Major depressive disorder, recurrent, unspecified: Secondary | ICD-10-CM | POA: Diagnosis not present

## 2019-07-13 DIAGNOSIS — S82891D Other fracture of right lower leg, subsequent encounter for closed fracture with routine healing: Secondary | ICD-10-CM | POA: Diagnosis not present

## 2019-07-13 DIAGNOSIS — M6281 Muscle weakness (generalized): Secondary | ICD-10-CM | POA: Diagnosis not present

## 2019-07-13 DIAGNOSIS — R627 Adult failure to thrive: Secondary | ICD-10-CM | POA: Diagnosis not present

## 2019-07-13 DIAGNOSIS — L89621 Pressure ulcer of left heel, stage 1: Secondary | ICD-10-CM | POA: Diagnosis not present

## 2019-07-13 DIAGNOSIS — R262 Difficulty in walking, not elsewhere classified: Secondary | ICD-10-CM | POA: Diagnosis not present

## 2019-07-13 DIAGNOSIS — R531 Weakness: Secondary | ICD-10-CM | POA: Diagnosis not present

## 2019-07-14 DIAGNOSIS — L89152 Pressure ulcer of sacral region, stage 2: Secondary | ICD-10-CM | POA: Diagnosis not present

## 2019-07-14 DIAGNOSIS — R531 Weakness: Secondary | ICD-10-CM | POA: Diagnosis not present

## 2019-07-14 DIAGNOSIS — F339 Major depressive disorder, recurrent, unspecified: Secondary | ICD-10-CM | POA: Diagnosis not present

## 2019-07-14 DIAGNOSIS — R262 Difficulty in walking, not elsewhere classified: Secondary | ICD-10-CM | POA: Diagnosis not present

## 2019-07-14 DIAGNOSIS — L89621 Pressure ulcer of left heel, stage 1: Secondary | ICD-10-CM | POA: Diagnosis not present

## 2019-07-14 DIAGNOSIS — M6281 Muscle weakness (generalized): Secondary | ICD-10-CM | POA: Diagnosis not present

## 2019-07-14 DIAGNOSIS — S82891D Other fracture of right lower leg, subsequent encounter for closed fracture with routine healing: Secondary | ICD-10-CM | POA: Diagnosis not present

## 2019-07-14 DIAGNOSIS — R627 Adult failure to thrive: Secondary | ICD-10-CM | POA: Diagnosis not present

## 2019-07-14 DIAGNOSIS — G8929 Other chronic pain: Secondary | ICD-10-CM | POA: Diagnosis not present

## 2019-07-18 DIAGNOSIS — F17200 Nicotine dependence, unspecified, uncomplicated: Secondary | ICD-10-CM | POA: Diagnosis not present

## 2019-07-18 DIAGNOSIS — G894 Chronic pain syndrome: Secondary | ICD-10-CM | POA: Diagnosis not present

## 2019-07-18 DIAGNOSIS — F411 Generalized anxiety disorder: Secondary | ICD-10-CM | POA: Diagnosis not present

## 2019-07-18 DIAGNOSIS — R627 Adult failure to thrive: Secondary | ICD-10-CM | POA: Diagnosis not present

## 2019-07-25 DIAGNOSIS — F418 Other specified anxiety disorders: Secondary | ICD-10-CM | POA: Diagnosis not present

## 2019-07-25 DIAGNOSIS — F411 Generalized anxiety disorder: Secondary | ICD-10-CM | POA: Diagnosis not present

## 2019-07-31 DIAGNOSIS — M3501 Sicca syndrome with keratoconjunctivitis: Secondary | ICD-10-CM | POA: Diagnosis not present

## 2019-07-31 DIAGNOSIS — H2513 Age-related nuclear cataract, bilateral: Secondary | ICD-10-CM | POA: Diagnosis not present

## 2019-07-31 DIAGNOSIS — H02052 Trichiasis without entropian right lower eyelid: Secondary | ICD-10-CM | POA: Diagnosis not present

## 2019-07-31 DIAGNOSIS — H04123 Dry eye syndrome of bilateral lacrimal glands: Secondary | ICD-10-CM | POA: Diagnosis not present

## 2019-08-02 DIAGNOSIS — M545 Low back pain: Secondary | ICD-10-CM | POA: Diagnosis not present

## 2019-08-02 DIAGNOSIS — G894 Chronic pain syndrome: Secondary | ICD-10-CM | POA: Diagnosis not present

## 2019-08-02 DIAGNOSIS — B0229 Other postherpetic nervous system involvement: Secondary | ICD-10-CM | POA: Diagnosis not present

## 2019-08-02 DIAGNOSIS — Z79891 Long term (current) use of opiate analgesic: Secondary | ICD-10-CM | POA: Diagnosis not present

## 2019-08-06 DIAGNOSIS — Z23 Encounter for immunization: Secondary | ICD-10-CM | POA: Diagnosis not present

## 2019-08-08 DIAGNOSIS — F411 Generalized anxiety disorder: Secondary | ICD-10-CM | POA: Diagnosis not present

## 2019-08-15 DIAGNOSIS — B0229 Other postherpetic nervous system involvement: Secondary | ICD-10-CM | POA: Diagnosis not present

## 2019-08-15 DIAGNOSIS — F330 Major depressive disorder, recurrent, mild: Secondary | ICD-10-CM | POA: Diagnosis not present

## 2019-08-15 DIAGNOSIS — H5713 Ocular pain, bilateral: Secondary | ICD-10-CM | POA: Diagnosis not present

## 2019-08-15 DIAGNOSIS — G894 Chronic pain syndrome: Secondary | ICD-10-CM | POA: Diagnosis not present

## 2019-08-22 DIAGNOSIS — F411 Generalized anxiety disorder: Secondary | ICD-10-CM | POA: Diagnosis not present

## 2019-08-22 DIAGNOSIS — F330 Major depressive disorder, recurrent, mild: Secondary | ICD-10-CM | POA: Diagnosis not present

## 2019-08-24 DIAGNOSIS — T1511XA Foreign body in conjunctival sac, right eye, initial encounter: Secondary | ICD-10-CM | POA: Diagnosis not present

## 2019-08-24 DIAGNOSIS — H01001 Unspecified blepharitis right upper eyelid: Secondary | ICD-10-CM | POA: Diagnosis not present

## 2019-08-24 DIAGNOSIS — H01004 Unspecified blepharitis left upper eyelid: Secondary | ICD-10-CM | POA: Diagnosis not present

## 2019-08-30 DIAGNOSIS — B0229 Other postherpetic nervous system involvement: Secondary | ICD-10-CM | POA: Diagnosis not present

## 2019-08-30 DIAGNOSIS — G894 Chronic pain syndrome: Secondary | ICD-10-CM | POA: Diagnosis not present

## 2019-08-30 DIAGNOSIS — Z79891 Long term (current) use of opiate analgesic: Secondary | ICD-10-CM | POA: Diagnosis not present

## 2019-08-30 DIAGNOSIS — M545 Low back pain: Secondary | ICD-10-CM | POA: Diagnosis not present

## 2019-09-06 DIAGNOSIS — G894 Chronic pain syndrome: Secondary | ICD-10-CM | POA: Diagnosis not present

## 2019-09-06 DIAGNOSIS — H5713 Ocular pain, bilateral: Secondary | ICD-10-CM | POA: Diagnosis not present

## 2019-09-06 DIAGNOSIS — F411 Generalized anxiety disorder: Secondary | ICD-10-CM | POA: Diagnosis not present

## 2019-09-06 DIAGNOSIS — R5381 Other malaise: Secondary | ICD-10-CM | POA: Diagnosis not present

## 2019-09-19 DIAGNOSIS — F411 Generalized anxiety disorder: Secondary | ICD-10-CM | POA: Diagnosis not present

## 2019-09-19 DIAGNOSIS — F330 Major depressive disorder, recurrent, mild: Secondary | ICD-10-CM | POA: Diagnosis not present
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
## 2019-09-27 DIAGNOSIS — M545 Low back pain: Secondary | ICD-10-CM | POA: Diagnosis not present

## 2019-09-27 DIAGNOSIS — G894 Chronic pain syndrome: Secondary | ICD-10-CM | POA: Diagnosis not present

## 2019-09-27 DIAGNOSIS — B0229 Other postherpetic nervous system involvement: Secondary | ICD-10-CM | POA: Diagnosis not present

## 2019-09-27 DIAGNOSIS — Z79891 Long term (current) use of opiate analgesic: Secondary | ICD-10-CM | POA: Diagnosis not present

## 2019-10-03 DIAGNOSIS — G894 Chronic pain syndrome: Secondary | ICD-10-CM | POA: Diagnosis not present

## 2019-10-03 DIAGNOSIS — F411 Generalized anxiety disorder: Secondary | ICD-10-CM | POA: Diagnosis not present

## 2019-10-03 DIAGNOSIS — M810 Age-related osteoporosis without current pathological fracture: Secondary | ICD-10-CM | POA: Diagnosis not present

## 2019-10-03 DIAGNOSIS — F330 Major depressive disorder, recurrent, mild: Secondary | ICD-10-CM | POA: Diagnosis not present

## 2019-10-09 DIAGNOSIS — Z20828 Contact with and (suspected) exposure to other viral communicable diseases: Secondary | ICD-10-CM | POA: Diagnosis not present

## 2019-10-22 DIAGNOSIS — Z20828 Contact with and (suspected) exposure to other viral communicable diseases: Secondary | ICD-10-CM | POA: Diagnosis not present

## 2019-10-25 DIAGNOSIS — M545 Low back pain: Secondary | ICD-10-CM | POA: Diagnosis not present

## 2019-10-25 DIAGNOSIS — Z79891 Long term (current) use of opiate analgesic: Secondary | ICD-10-CM | POA: Diagnosis not present

## 2019-10-25 DIAGNOSIS — B0229 Other postherpetic nervous system involvement: Secondary | ICD-10-CM | POA: Diagnosis not present

## 2019-10-25 DIAGNOSIS — G894 Chronic pain syndrome: Secondary | ICD-10-CM | POA: Diagnosis not present

## 2019-10-26 DIAGNOSIS — Z20828 Contact with and (suspected) exposure to other viral communicable diseases: Secondary | ICD-10-CM | POA: Diagnosis not present

## 2019-10-29 DIAGNOSIS — Z20828 Contact with and (suspected) exposure to other viral communicable diseases: Secondary | ICD-10-CM | POA: Diagnosis not present

## 2019-10-31 DIAGNOSIS — M810 Age-related osteoporosis without current pathological fracture: Secondary | ICD-10-CM | POA: Diagnosis not present

## 2019-10-31 DIAGNOSIS — G894 Chronic pain syndrome: Secondary | ICD-10-CM | POA: Diagnosis not present

## 2019-10-31 DIAGNOSIS — F330 Major depressive disorder, recurrent, mild: Secondary | ICD-10-CM | POA: Diagnosis not present

## 2019-11-01 DIAGNOSIS — Z20828 Contact with and (suspected) exposure to other viral communicable diseases: Secondary | ICD-10-CM | POA: Diagnosis not present

## 2019-11-05 DIAGNOSIS — Z20828 Contact with and (suspected) exposure to other viral communicable diseases: Secondary | ICD-10-CM | POA: Diagnosis not present

## 2019-11-08 DIAGNOSIS — Z20828 Contact with and (suspected) exposure to other viral communicable diseases: Secondary | ICD-10-CM | POA: Diagnosis not present

## 2019-11-12 DIAGNOSIS — Z20828 Contact with and (suspected) exposure to other viral communicable diseases: Secondary | ICD-10-CM | POA: Diagnosis not present

## 2019-11-15 DIAGNOSIS — Z20828 Contact with and (suspected) exposure to other viral communicable diseases: Secondary | ICD-10-CM | POA: Diagnosis not present

## 2019-11-19 DIAGNOSIS — Z20822 Contact with and (suspected) exposure to covid-19: Secondary | ICD-10-CM | POA: Diagnosis not present

## 2019-11-22 DIAGNOSIS — Z20828 Contact with and (suspected) exposure to other viral communicable diseases: Secondary | ICD-10-CM | POA: Diagnosis not present

## 2019-11-22 DIAGNOSIS — Z79891 Long term (current) use of opiate analgesic: Secondary | ICD-10-CM | POA: Diagnosis not present

## 2019-11-22 DIAGNOSIS — G894 Chronic pain syndrome: Secondary | ICD-10-CM | POA: Diagnosis not present

## 2019-11-22 DIAGNOSIS — B0229 Other postherpetic nervous system involvement: Secondary | ICD-10-CM | POA: Diagnosis not present

## 2019-11-22 DIAGNOSIS — M545 Low back pain: Secondary | ICD-10-CM | POA: Diagnosis not present

## 2019-11-26 DIAGNOSIS — Z20828 Contact with and (suspected) exposure to other viral communicable diseases: Secondary | ICD-10-CM | POA: Diagnosis not present

## 2019-11-29 DIAGNOSIS — R627 Adult failure to thrive: Secondary | ICD-10-CM | POA: Diagnosis not present

## 2019-11-29 DIAGNOSIS — G894 Chronic pain syndrome: Secondary | ICD-10-CM | POA: Diagnosis not present

## 2019-11-29 DIAGNOSIS — Z20828 Contact with and (suspected) exposure to other viral communicable diseases: Secondary | ICD-10-CM | POA: Diagnosis not present

## 2019-11-29 DIAGNOSIS — F411 Generalized anxiety disorder: Secondary | ICD-10-CM | POA: Diagnosis not present

## 2019-11-29 DIAGNOSIS — F1120 Opioid dependence, uncomplicated: Secondary | ICD-10-CM | POA: Diagnosis not present

## 2019-12-03 DIAGNOSIS — Z20828 Contact with and (suspected) exposure to other viral communicable diseases: Secondary | ICD-10-CM | POA: Diagnosis not present

## 2019-12-06 DIAGNOSIS — Z20828 Contact with and (suspected) exposure to other viral communicable diseases: Secondary | ICD-10-CM | POA: Diagnosis not present

## 2019-12-07 DIAGNOSIS — K5900 Constipation, unspecified: Secondary | ICD-10-CM | POA: Diagnosis not present

## 2019-12-07 DIAGNOSIS — G4700 Insomnia, unspecified: Secondary | ICD-10-CM | POA: Diagnosis not present

## 2019-12-07 DIAGNOSIS — M810 Age-related osteoporosis without current pathological fracture: Secondary | ICD-10-CM | POA: Diagnosis not present

## 2019-12-07 DIAGNOSIS — G894 Chronic pain syndrome: Secondary | ICD-10-CM | POA: Diagnosis not present

## 2019-12-08 DIAGNOSIS — Z20828 Contact with and (suspected) exposure to other viral communicable diseases: Secondary | ICD-10-CM | POA: Diagnosis not present

## 2019-12-11 DIAGNOSIS — F411 Generalized anxiety disorder: Secondary | ICD-10-CM | POA: Diagnosis not present

## 2019-12-11 DIAGNOSIS — F330 Major depressive disorder, recurrent, mild: Secondary | ICD-10-CM | POA: Diagnosis not present

## 2019-12-13 DIAGNOSIS — R627 Adult failure to thrive: Secondary | ICD-10-CM | POA: Diagnosis not present

## 2019-12-13 DIAGNOSIS — L89621 Pressure ulcer of left heel, stage 1: Secondary | ICD-10-CM | POA: Diagnosis not present

## 2019-12-13 DIAGNOSIS — L89152 Pressure ulcer of sacral region, stage 2: Secondary | ICD-10-CM | POA: Diagnosis not present

## 2019-12-13 DIAGNOSIS — R531 Weakness: Secondary | ICD-10-CM | POA: Diagnosis not present

## 2019-12-13 DIAGNOSIS — Z20828 Contact with and (suspected) exposure to other viral communicable diseases: Secondary | ICD-10-CM | POA: Diagnosis not present

## 2019-12-13 DIAGNOSIS — G8929 Other chronic pain: Secondary | ICD-10-CM | POA: Diagnosis not present

## 2019-12-15 DIAGNOSIS — L89152 Pressure ulcer of sacral region, stage 2: Secondary | ICD-10-CM | POA: Diagnosis not present

## 2019-12-15 DIAGNOSIS — L89621 Pressure ulcer of left heel, stage 1: Secondary | ICD-10-CM | POA: Diagnosis not present

## 2019-12-15 DIAGNOSIS — R531 Weakness: Secondary | ICD-10-CM | POA: Diagnosis not present

## 2019-12-15 DIAGNOSIS — G8929 Other chronic pain: Secondary | ICD-10-CM | POA: Diagnosis not present

## 2019-12-15 DIAGNOSIS — R627 Adult failure to thrive: Secondary | ICD-10-CM | POA: Diagnosis not present

## 2019-12-17 DIAGNOSIS — Z20828 Contact with and (suspected) exposure to other viral communicable diseases: Secondary | ICD-10-CM | POA: Diagnosis not present

## 2019-12-18 DIAGNOSIS — L89621 Pressure ulcer of left heel, stage 1: Secondary | ICD-10-CM | POA: Diagnosis not present

## 2019-12-18 DIAGNOSIS — R627 Adult failure to thrive: Secondary | ICD-10-CM | POA: Diagnosis not present

## 2019-12-18 DIAGNOSIS — L89152 Pressure ulcer of sacral region, stage 2: Secondary | ICD-10-CM | POA: Diagnosis not present

## 2019-12-18 DIAGNOSIS — R531 Weakness: Secondary | ICD-10-CM | POA: Diagnosis not present

## 2019-12-18 DIAGNOSIS — G8929 Other chronic pain: Secondary | ICD-10-CM | POA: Diagnosis not present

## 2019-12-19 DIAGNOSIS — L89621 Pressure ulcer of left heel, stage 1: Secondary | ICD-10-CM | POA: Diagnosis not present

## 2019-12-19 DIAGNOSIS — L89152 Pressure ulcer of sacral region, stage 2: Secondary | ICD-10-CM | POA: Diagnosis not present

## 2019-12-19 DIAGNOSIS — G8929 Other chronic pain: Secondary | ICD-10-CM | POA: Diagnosis not present

## 2019-12-19 DIAGNOSIS — R627 Adult failure to thrive: Secondary | ICD-10-CM | POA: Diagnosis not present

## 2019-12-19 DIAGNOSIS — R531 Weakness: Secondary | ICD-10-CM | POA: Diagnosis not present

## 2019-12-20 DIAGNOSIS — L89152 Pressure ulcer of sacral region, stage 2: Secondary | ICD-10-CM | POA: Diagnosis not present

## 2019-12-20 DIAGNOSIS — L89621 Pressure ulcer of left heel, stage 1: Secondary | ICD-10-CM | POA: Diagnosis not present

## 2019-12-20 DIAGNOSIS — R627 Adult failure to thrive: Secondary | ICD-10-CM | POA: Diagnosis not present

## 2019-12-20 DIAGNOSIS — F329 Major depressive disorder, single episode, unspecified: Secondary | ICD-10-CM | POA: Diagnosis not present

## 2019-12-20 DIAGNOSIS — R531 Weakness: Secondary | ICD-10-CM | POA: Diagnosis not present

## 2019-12-20 DIAGNOSIS — F411 Generalized anxiety disorder: Secondary | ICD-10-CM | POA: Diagnosis not present

## 2019-12-20 DIAGNOSIS — B0229 Other postherpetic nervous system involvement: Secondary | ICD-10-CM | POA: Diagnosis not present

## 2019-12-20 DIAGNOSIS — M545 Low back pain: Secondary | ICD-10-CM | POA: Diagnosis not present

## 2019-12-20 DIAGNOSIS — G8929 Other chronic pain: Secondary | ICD-10-CM | POA: Diagnosis not present

## 2019-12-20 DIAGNOSIS — G894 Chronic pain syndrome: Secondary | ICD-10-CM | POA: Diagnosis not present

## 2019-12-20 DIAGNOSIS — Z79891 Long term (current) use of opiate analgesic: Secondary | ICD-10-CM | POA: Diagnosis not present

## 2019-12-21 DIAGNOSIS — L89621 Pressure ulcer of left heel, stage 1: Secondary | ICD-10-CM | POA: Diagnosis not present

## 2019-12-21 DIAGNOSIS — G8929 Other chronic pain: Secondary | ICD-10-CM | POA: Diagnosis not present

## 2019-12-21 DIAGNOSIS — R627 Adult failure to thrive: Secondary | ICD-10-CM | POA: Diagnosis not present

## 2019-12-21 DIAGNOSIS — R531 Weakness: Secondary | ICD-10-CM | POA: Diagnosis not present

## 2019-12-21 DIAGNOSIS — L89152 Pressure ulcer of sacral region, stage 2: Secondary | ICD-10-CM | POA: Diagnosis not present

## 2019-12-22 DIAGNOSIS — L89152 Pressure ulcer of sacral region, stage 2: Secondary | ICD-10-CM | POA: Diagnosis not present

## 2019-12-22 DIAGNOSIS — G8929 Other chronic pain: Secondary | ICD-10-CM | POA: Diagnosis not present

## 2019-12-22 DIAGNOSIS — L89621 Pressure ulcer of left heel, stage 1: Secondary | ICD-10-CM | POA: Diagnosis not present

## 2019-12-22 DIAGNOSIS — R627 Adult failure to thrive: Secondary | ICD-10-CM | POA: Diagnosis not present

## 2019-12-22 DIAGNOSIS — R531 Weakness: Secondary | ICD-10-CM | POA: Diagnosis not present

## 2019-12-25 DIAGNOSIS — G8929 Other chronic pain: Secondary | ICD-10-CM | POA: Diagnosis not present

## 2019-12-25 DIAGNOSIS — L89152 Pressure ulcer of sacral region, stage 2: Secondary | ICD-10-CM | POA: Diagnosis not present

## 2019-12-25 DIAGNOSIS — L89621 Pressure ulcer of left heel, stage 1: Secondary | ICD-10-CM | POA: Diagnosis not present

## 2019-12-25 DIAGNOSIS — R531 Weakness: Secondary | ICD-10-CM | POA: Diagnosis not present

## 2019-12-25 DIAGNOSIS — R627 Adult failure to thrive: Secondary | ICD-10-CM | POA: Diagnosis not present

## 2019-12-26 DIAGNOSIS — L89621 Pressure ulcer of left heel, stage 1: Secondary | ICD-10-CM | POA: Diagnosis not present

## 2019-12-26 DIAGNOSIS — L89152 Pressure ulcer of sacral region, stage 2: Secondary | ICD-10-CM | POA: Diagnosis not present

## 2019-12-26 DIAGNOSIS — G8929 Other chronic pain: Secondary | ICD-10-CM | POA: Diagnosis not present

## 2019-12-26 DIAGNOSIS — R531 Weakness: Secondary | ICD-10-CM | POA: Diagnosis not present

## 2019-12-26 DIAGNOSIS — R627 Adult failure to thrive: Secondary | ICD-10-CM | POA: Diagnosis not present

## 2019-12-26 DIAGNOSIS — F411 Generalized anxiety disorder: Secondary | ICD-10-CM | POA: Diagnosis not present

## 2019-12-27 DIAGNOSIS — R627 Adult failure to thrive: Secondary | ICD-10-CM | POA: Diagnosis not present

## 2019-12-27 DIAGNOSIS — R531 Weakness: Secondary | ICD-10-CM | POA: Diagnosis not present

## 2019-12-27 DIAGNOSIS — L89152 Pressure ulcer of sacral region, stage 2: Secondary | ICD-10-CM | POA: Diagnosis not present

## 2019-12-27 DIAGNOSIS — G8929 Other chronic pain: Secondary | ICD-10-CM | POA: Diagnosis not present

## 2019-12-27 DIAGNOSIS — L89621 Pressure ulcer of left heel, stage 1: Secondary | ICD-10-CM | POA: Diagnosis not present

## 2019-12-28 DIAGNOSIS — L89621 Pressure ulcer of left heel, stage 1: Secondary | ICD-10-CM | POA: Diagnosis not present

## 2019-12-28 DIAGNOSIS — R627 Adult failure to thrive: Secondary | ICD-10-CM | POA: Diagnosis not present

## 2019-12-28 DIAGNOSIS — L89152 Pressure ulcer of sacral region, stage 2: Secondary | ICD-10-CM | POA: Diagnosis not present

## 2019-12-28 DIAGNOSIS — R531 Weakness: Secondary | ICD-10-CM | POA: Diagnosis not present

## 2019-12-28 DIAGNOSIS — G8929 Other chronic pain: Secondary | ICD-10-CM | POA: Diagnosis not present

## 2019-12-29 DIAGNOSIS — L89152 Pressure ulcer of sacral region, stage 2: Secondary | ICD-10-CM | POA: Diagnosis not present

## 2019-12-29 DIAGNOSIS — G8929 Other chronic pain: Secondary | ICD-10-CM | POA: Diagnosis not present

## 2019-12-29 DIAGNOSIS — R627 Adult failure to thrive: Secondary | ICD-10-CM | POA: Diagnosis not present

## 2019-12-29 DIAGNOSIS — R531 Weakness: Secondary | ICD-10-CM | POA: Diagnosis not present

## 2019-12-29 DIAGNOSIS — L89621 Pressure ulcer of left heel, stage 1: Secondary | ICD-10-CM | POA: Diagnosis not present

## 2020-01-01 DIAGNOSIS — R531 Weakness: Secondary | ICD-10-CM | POA: Diagnosis not present

## 2020-01-01 DIAGNOSIS — R3129 Other microscopic hematuria: Secondary | ICD-10-CM | POA: Diagnosis not present

## 2020-01-01 DIAGNOSIS — M6281 Muscle weakness (generalized): Secondary | ICD-10-CM | POA: Diagnosis not present

## 2020-01-01 DIAGNOSIS — R627 Adult failure to thrive: Secondary | ICD-10-CM | POA: Diagnosis not present

## 2020-01-01 DIAGNOSIS — G8929 Other chronic pain: Secondary | ICD-10-CM | POA: Diagnosis not present

## 2020-01-01 DIAGNOSIS — L89152 Pressure ulcer of sacral region, stage 2: Secondary | ICD-10-CM | POA: Diagnosis not present

## 2020-01-01 DIAGNOSIS — E538 Deficiency of other specified B group vitamins: Secondary | ICD-10-CM | POA: Diagnosis not present

## 2020-01-01 DIAGNOSIS — L89621 Pressure ulcer of left heel, stage 1: Secondary | ICD-10-CM | POA: Diagnosis not present

## 2020-01-01 DIAGNOSIS — B379 Candidiasis, unspecified: Secondary | ICD-10-CM | POA: Diagnosis not present

## 2020-01-01 DIAGNOSIS — Z6827 Body mass index (BMI) 27.0-27.9, adult: Secondary | ICD-10-CM | POA: Diagnosis not present

## 2020-01-02 DIAGNOSIS — Z6827 Body mass index (BMI) 27.0-27.9, adult: Secondary | ICD-10-CM | POA: Diagnosis not present

## 2020-01-02 DIAGNOSIS — M6281 Muscle weakness (generalized): Secondary | ICD-10-CM | POA: Diagnosis not present

## 2020-01-02 DIAGNOSIS — R3129 Other microscopic hematuria: Secondary | ICD-10-CM | POA: Diagnosis not present

## 2020-01-02 DIAGNOSIS — R531 Weakness: Secondary | ICD-10-CM | POA: Diagnosis not present

## 2020-01-02 DIAGNOSIS — E538 Deficiency of other specified B group vitamins: Secondary | ICD-10-CM | POA: Diagnosis not present

## 2020-01-02 DIAGNOSIS — F411 Generalized anxiety disorder: Secondary | ICD-10-CM | POA: Diagnosis not present

## 2020-01-02 DIAGNOSIS — G8929 Other chronic pain: Secondary | ICD-10-CM | POA: Diagnosis not present

## 2020-01-02 DIAGNOSIS — L89152 Pressure ulcer of sacral region, stage 2: Secondary | ICD-10-CM | POA: Diagnosis not present

## 2020-01-02 DIAGNOSIS — B379 Candidiasis, unspecified: Secondary | ICD-10-CM | POA: Diagnosis not present

## 2020-01-02 DIAGNOSIS — L89621 Pressure ulcer of left heel, stage 1: Secondary | ICD-10-CM | POA: Diagnosis not present

## 2020-01-02 DIAGNOSIS — R627 Adult failure to thrive: Secondary | ICD-10-CM | POA: Diagnosis not present

## 2020-01-03 DIAGNOSIS — R3129 Other microscopic hematuria: Secondary | ICD-10-CM | POA: Diagnosis not present

## 2020-01-03 DIAGNOSIS — E538 Deficiency of other specified B group vitamins: Secondary | ICD-10-CM | POA: Diagnosis not present

## 2020-01-03 DIAGNOSIS — R531 Weakness: Secondary | ICD-10-CM | POA: Diagnosis not present

## 2020-01-03 DIAGNOSIS — M6281 Muscle weakness (generalized): Secondary | ICD-10-CM | POA: Diagnosis not present

## 2020-01-03 DIAGNOSIS — R627 Adult failure to thrive: Secondary | ICD-10-CM | POA: Diagnosis not present

## 2020-01-03 DIAGNOSIS — Z6827 Body mass index (BMI) 27.0-27.9, adult: Secondary | ICD-10-CM | POA: Diagnosis not present

## 2020-01-03 DIAGNOSIS — L89621 Pressure ulcer of left heel, stage 1: Secondary | ICD-10-CM | POA: Diagnosis not present

## 2020-01-03 DIAGNOSIS — B379 Candidiasis, unspecified: Secondary | ICD-10-CM | POA: Diagnosis not present

## 2020-01-03 DIAGNOSIS — L89152 Pressure ulcer of sacral region, stage 2: Secondary | ICD-10-CM | POA: Diagnosis not present

## 2020-01-03 DIAGNOSIS — G8929 Other chronic pain: Secondary | ICD-10-CM | POA: Diagnosis not present

## 2020-01-04 DIAGNOSIS — R627 Adult failure to thrive: Secondary | ICD-10-CM | POA: Diagnosis not present

## 2020-01-04 DIAGNOSIS — B379 Candidiasis, unspecified: Secondary | ICD-10-CM | POA: Diagnosis not present

## 2020-01-04 DIAGNOSIS — G894 Chronic pain syndrome: Secondary | ICD-10-CM | POA: Diagnosis not present

## 2020-01-04 DIAGNOSIS — F330 Major depressive disorder, recurrent, mild: Secondary | ICD-10-CM | POA: Diagnosis not present

## 2020-01-04 DIAGNOSIS — F411 Generalized anxiety disorder: Secondary | ICD-10-CM | POA: Diagnosis not present

## 2020-01-04 DIAGNOSIS — G4700 Insomnia, unspecified: Secondary | ICD-10-CM | POA: Diagnosis not present

## 2020-01-04 DIAGNOSIS — E538 Deficiency of other specified B group vitamins: Secondary | ICD-10-CM | POA: Diagnosis not present

## 2020-01-04 DIAGNOSIS — R3129 Other microscopic hematuria: Secondary | ICD-10-CM | POA: Diagnosis not present

## 2020-01-04 DIAGNOSIS — L89621 Pressure ulcer of left heel, stage 1: Secondary | ICD-10-CM | POA: Diagnosis not present

## 2020-01-04 DIAGNOSIS — G8929 Other chronic pain: Secondary | ICD-10-CM | POA: Diagnosis not present

## 2020-01-04 DIAGNOSIS — Z6827 Body mass index (BMI) 27.0-27.9, adult: Secondary | ICD-10-CM | POA: Diagnosis not present

## 2020-01-04 DIAGNOSIS — M6281 Muscle weakness (generalized): Secondary | ICD-10-CM | POA: Diagnosis not present

## 2020-01-04 DIAGNOSIS — L89152 Pressure ulcer of sacral region, stage 2: Secondary | ICD-10-CM | POA: Diagnosis not present

## 2020-01-04 DIAGNOSIS — R531 Weakness: Secondary | ICD-10-CM | POA: Diagnosis not present

## 2020-01-05 DIAGNOSIS — D519 Vitamin B12 deficiency anemia, unspecified: Secondary | ICD-10-CM | POA: Diagnosis not present

## 2020-01-05 DIAGNOSIS — Z79899 Other long term (current) drug therapy: Secondary | ICD-10-CM | POA: Diagnosis not present

## 2020-01-05 DIAGNOSIS — E559 Vitamin D deficiency, unspecified: Secondary | ICD-10-CM | POA: Diagnosis not present

## 2020-01-05 DIAGNOSIS — D649 Anemia, unspecified: Secondary | ICD-10-CM | POA: Diagnosis not present

## 2020-01-10 DIAGNOSIS — F411 Generalized anxiety disorder: Secondary | ICD-10-CM | POA: Diagnosis not present

## 2020-01-11 DIAGNOSIS — E039 Hypothyroidism, unspecified: Secondary | ICD-10-CM | POA: Diagnosis not present

## 2020-01-11 DIAGNOSIS — Z79899 Other long term (current) drug therapy: Secondary | ICD-10-CM | POA: Diagnosis not present

## 2020-01-18 DIAGNOSIS — G894 Chronic pain syndrome: Secondary | ICD-10-CM | POA: Diagnosis not present

## 2020-01-18 DIAGNOSIS — Z79891 Long term (current) use of opiate analgesic: Secondary | ICD-10-CM | POA: Diagnosis not present

## 2020-01-18 DIAGNOSIS — M545 Low back pain: Secondary | ICD-10-CM | POA: Diagnosis not present

## 2020-01-18 DIAGNOSIS — B0229 Other postherpetic nervous system involvement: Secondary | ICD-10-CM | POA: Diagnosis not present

## 2020-01-23 DIAGNOSIS — R5381 Other malaise: Secondary | ICD-10-CM | POA: Diagnosis not present

## 2020-01-23 DIAGNOSIS — R627 Adult failure to thrive: Secondary | ICD-10-CM | POA: Diagnosis not present

## 2020-01-23 DIAGNOSIS — L8990 Pressure ulcer of unspecified site, unspecified stage: Secondary | ICD-10-CM | POA: Diagnosis not present

## 2020-01-23 DIAGNOSIS — F1120 Opioid dependence, uncomplicated: Secondary | ICD-10-CM | POA: Diagnosis not present

## 2020-01-25 DIAGNOSIS — E039 Hypothyroidism, unspecified: Secondary | ICD-10-CM | POA: Diagnosis not present

## 2020-01-25 DIAGNOSIS — R799 Abnormal finding of blood chemistry, unspecified: Secondary | ICD-10-CM | POA: Diagnosis not present

## 2020-01-30 DIAGNOSIS — E039 Hypothyroidism, unspecified: Secondary | ICD-10-CM | POA: Diagnosis not present

## 2020-01-30 DIAGNOSIS — Z79899 Other long term (current) drug therapy: Secondary | ICD-10-CM | POA: Diagnosis not present

## 2020-02-08 DIAGNOSIS — G894 Chronic pain syndrome: Secondary | ICD-10-CM | POA: Diagnosis not present

## 2020-02-08 DIAGNOSIS — G4700 Insomnia, unspecified: Secondary | ICD-10-CM | POA: Diagnosis not present

## 2020-02-08 DIAGNOSIS — B0229 Other postherpetic nervous system involvement: Secondary | ICD-10-CM | POA: Diagnosis not present

## 2020-02-08 DIAGNOSIS — I10 Essential (primary) hypertension: Secondary | ICD-10-CM | POA: Diagnosis not present

## 2020-02-13 DIAGNOSIS — F330 Major depressive disorder, recurrent, mild: Secondary | ICD-10-CM | POA: Diagnosis not present

## 2020-02-13 DIAGNOSIS — F411 Generalized anxiety disorder: Secondary | ICD-10-CM | POA: Diagnosis not present

## 2020-02-19 DIAGNOSIS — Z20828 Contact with and (suspected) exposure to other viral communicable diseases: Secondary | ICD-10-CM | POA: Diagnosis not present

## 2020-02-20 DIAGNOSIS — R5381 Other malaise: Secondary | ICD-10-CM | POA: Diagnosis not present

## 2020-02-20 DIAGNOSIS — F411 Generalized anxiety disorder: Secondary | ICD-10-CM | POA: Diagnosis not present

## 2020-02-20 DIAGNOSIS — R627 Adult failure to thrive: Secondary | ICD-10-CM | POA: Diagnosis not present

## 2020-02-20 DIAGNOSIS — G894 Chronic pain syndrome: Secondary | ICD-10-CM | POA: Diagnosis not present

## 2020-02-22 DIAGNOSIS — Z20828 Contact with and (suspected) exposure to other viral communicable diseases: Secondary | ICD-10-CM | POA: Diagnosis not present

## 2020-02-26 DIAGNOSIS — Z20828 Contact with and (suspected) exposure to other viral communicable diseases: Secondary | ICD-10-CM | POA: Diagnosis not present

## 2020-02-29 DIAGNOSIS — Z20828 Contact with and (suspected) exposure to other viral communicable diseases: Secondary | ICD-10-CM | POA: Diagnosis not present

## 2020-03-04 DIAGNOSIS — Z20828 Contact with and (suspected) exposure to other viral communicable diseases: Secondary | ICD-10-CM | POA: Diagnosis not present

## 2020-03-06 DIAGNOSIS — M545 Low back pain: Secondary | ICD-10-CM | POA: Diagnosis not present

## 2020-03-06 DIAGNOSIS — Z79891 Long term (current) use of opiate analgesic: Secondary | ICD-10-CM | POA: Diagnosis not present

## 2020-03-06 DIAGNOSIS — G894 Chronic pain syndrome: Secondary | ICD-10-CM | POA: Diagnosis not present

## 2020-03-06 DIAGNOSIS — B0229 Other postherpetic nervous system involvement: Secondary | ICD-10-CM | POA: Diagnosis not present

## 2020-03-07 DIAGNOSIS — F418 Other specified anxiety disorders: Secondary | ICD-10-CM | POA: Diagnosis not present

## 2020-03-07 DIAGNOSIS — F329 Major depressive disorder, single episode, unspecified: Secondary | ICD-10-CM | POA: Diagnosis not present

## 2020-03-08 DIAGNOSIS — Z20828 Contact with and (suspected) exposure to other viral communicable diseases: Secondary | ICD-10-CM | POA: Diagnosis not present

## 2020-03-11 DIAGNOSIS — Z20828 Contact with and (suspected) exposure to other viral communicable diseases: Secondary | ICD-10-CM | POA: Diagnosis not present

## 2020-03-14 DIAGNOSIS — E039 Hypothyroidism, unspecified: Secondary | ICD-10-CM | POA: Diagnosis not present

## 2020-03-14 DIAGNOSIS — Z20828 Contact with and (suspected) exposure to other viral communicable diseases: Secondary | ICD-10-CM | POA: Diagnosis not present

## 2020-03-14 DIAGNOSIS — R6889 Other general symptoms and signs: Secondary | ICD-10-CM | POA: Diagnosis not present

## 2020-03-18 DIAGNOSIS — F329 Major depressive disorder, single episode, unspecified: Secondary | ICD-10-CM | POA: Diagnosis not present

## 2020-03-18 DIAGNOSIS — E039 Hypothyroidism, unspecified: Secondary | ICD-10-CM | POA: Diagnosis not present

## 2020-03-18 DIAGNOSIS — G894 Chronic pain syndrome: Secondary | ICD-10-CM | POA: Diagnosis not present

## 2020-03-18 DIAGNOSIS — F418 Other specified anxiety disorders: Secondary | ICD-10-CM | POA: Diagnosis not present

## 2020-03-19 DIAGNOSIS — F329 Major depressive disorder, single episode, unspecified: Secondary | ICD-10-CM | POA: Diagnosis not present

## 2020-03-27 DIAGNOSIS — F330 Major depressive disorder, recurrent, mild: Secondary | ICD-10-CM | POA: Diagnosis not present

## 2020-03-27 DIAGNOSIS — F411 Generalized anxiety disorder: Secondary | ICD-10-CM | POA: Diagnosis not present

## 2020-04-03 DIAGNOSIS — F411 Generalized anxiety disorder: Secondary | ICD-10-CM | POA: Diagnosis not present

## 2020-04-05 DIAGNOSIS — F330 Major depressive disorder, recurrent, mild: Secondary | ICD-10-CM | POA: Diagnosis not present

## 2020-04-05 DIAGNOSIS — B0229 Other postherpetic nervous system involvement: Secondary | ICD-10-CM | POA: Diagnosis not present

## 2020-04-05 DIAGNOSIS — E039 Hypothyroidism, unspecified: Secondary | ICD-10-CM | POA: Diagnosis not present

## 2020-04-05 DIAGNOSIS — G894 Chronic pain syndrome: Secondary | ICD-10-CM | POA: Diagnosis not present

## 2020-04-08 DIAGNOSIS — F418 Other specified anxiety disorders: Secondary | ICD-10-CM | POA: Diagnosis not present

## 2020-04-08 DIAGNOSIS — F329 Major depressive disorder, single episode, unspecified: Secondary | ICD-10-CM | POA: Diagnosis not present

## 2020-04-17 DIAGNOSIS — R531 Weakness: Secondary | ICD-10-CM | POA: Diagnosis not present

## 2020-04-17 DIAGNOSIS — G8929 Other chronic pain: Secondary | ICD-10-CM | POA: Diagnosis not present

## 2020-04-17 DIAGNOSIS — M6281 Muscle weakness (generalized): Secondary | ICD-10-CM | POA: Diagnosis not present

## 2020-04-17 DIAGNOSIS — I10 Essential (primary) hypertension: Secondary | ICD-10-CM | POA: Diagnosis not present

## 2020-04-17 DIAGNOSIS — H04123 Dry eye syndrome of bilateral lacrimal glands: Secondary | ICD-10-CM | POA: Diagnosis not present

## 2020-04-17 DIAGNOSIS — R627 Adult failure to thrive: Secondary | ICD-10-CM | POA: Diagnosis not present

## 2020-04-17 DIAGNOSIS — H2513 Age-related nuclear cataract, bilateral: Secondary | ICD-10-CM | POA: Diagnosis not present

## 2020-04-18 DIAGNOSIS — F330 Major depressive disorder, recurrent, mild: Secondary | ICD-10-CM | POA: Diagnosis not present

## 2020-04-18 DIAGNOSIS — R627 Adult failure to thrive: Secondary | ICD-10-CM | POA: Diagnosis not present

## 2020-04-18 DIAGNOSIS — F411 Generalized anxiety disorder: Secondary | ICD-10-CM | POA: Diagnosis not present

## 2020-04-18 DIAGNOSIS — G894 Chronic pain syndrome: Secondary | ICD-10-CM | POA: Diagnosis not present

## 2020-04-18 DIAGNOSIS — R531 Weakness: Secondary | ICD-10-CM | POA: Diagnosis not present

## 2020-04-18 DIAGNOSIS — G8929 Other chronic pain: Secondary | ICD-10-CM | POA: Diagnosis not present

## 2020-04-18 DIAGNOSIS — M6281 Muscle weakness (generalized): Secondary | ICD-10-CM | POA: Diagnosis not present

## 2020-04-18 DIAGNOSIS — M810 Age-related osteoporosis without current pathological fracture: Secondary | ICD-10-CM | POA: Diagnosis not present

## 2020-04-19 DIAGNOSIS — M6281 Muscle weakness (generalized): Secondary | ICD-10-CM | POA: Diagnosis not present

## 2020-04-19 DIAGNOSIS — R531 Weakness: Secondary | ICD-10-CM | POA: Diagnosis not present

## 2020-04-19 DIAGNOSIS — R627 Adult failure to thrive: Secondary | ICD-10-CM | POA: Diagnosis not present

## 2020-04-19 DIAGNOSIS — G8929 Other chronic pain: Secondary | ICD-10-CM | POA: Diagnosis not present

## 2020-04-20 DIAGNOSIS — E039 Hypothyroidism, unspecified: Secondary | ICD-10-CM | POA: Diagnosis not present

## 2020-04-20 DIAGNOSIS — D649 Anemia, unspecified: Secondary | ICD-10-CM | POA: Diagnosis not present

## 2020-04-22 DIAGNOSIS — R531 Weakness: Secondary | ICD-10-CM | POA: Diagnosis not present

## 2020-04-22 DIAGNOSIS — R627 Adult failure to thrive: Secondary | ICD-10-CM | POA: Diagnosis not present

## 2020-04-22 DIAGNOSIS — G8929 Other chronic pain: Secondary | ICD-10-CM | POA: Diagnosis not present

## 2020-04-22 DIAGNOSIS — M6281 Muscle weakness (generalized): Secondary | ICD-10-CM | POA: Diagnosis not present

## 2020-04-23 DIAGNOSIS — M6281 Muscle weakness (generalized): Secondary | ICD-10-CM | POA: Diagnosis not present

## 2020-04-23 DIAGNOSIS — R627 Adult failure to thrive: Secondary | ICD-10-CM | POA: Diagnosis not present

## 2020-04-23 DIAGNOSIS — R531 Weakness: Secondary | ICD-10-CM | POA: Diagnosis not present

## 2020-04-23 DIAGNOSIS — G8929 Other chronic pain: Secondary | ICD-10-CM | POA: Diagnosis not present

## 2020-04-24 DIAGNOSIS — F418 Other specified anxiety disorders: Secondary | ICD-10-CM | POA: Diagnosis not present

## 2020-04-24 DIAGNOSIS — F329 Major depressive disorder, single episode, unspecified: Secondary | ICD-10-CM | POA: Diagnosis not present

## 2020-04-25 DIAGNOSIS — R531 Weakness: Secondary | ICD-10-CM | POA: Diagnosis not present

## 2020-04-25 DIAGNOSIS — M6281 Muscle weakness (generalized): Secondary | ICD-10-CM | POA: Diagnosis not present

## 2020-04-25 DIAGNOSIS — R627 Adult failure to thrive: Secondary | ICD-10-CM | POA: Diagnosis not present

## 2020-04-25 DIAGNOSIS — G8929 Other chronic pain: Secondary | ICD-10-CM | POA: Diagnosis not present

## 2020-04-30 DIAGNOSIS — M6281 Muscle weakness (generalized): Secondary | ICD-10-CM | POA: Diagnosis not present

## 2020-04-30 DIAGNOSIS — R531 Weakness: Secondary | ICD-10-CM | POA: Diagnosis not present

## 2020-04-30 DIAGNOSIS — G8929 Other chronic pain: Secondary | ICD-10-CM | POA: Diagnosis not present

## 2020-04-30 DIAGNOSIS — R627 Adult failure to thrive: Secondary | ICD-10-CM | POA: Diagnosis not present

## 2020-05-02 DIAGNOSIS — R531 Weakness: Secondary | ICD-10-CM | POA: Diagnosis not present

## 2020-05-02 DIAGNOSIS — M6281 Muscle weakness (generalized): Secondary | ICD-10-CM | POA: Diagnosis not present

## 2020-05-02 DIAGNOSIS — R627 Adult failure to thrive: Secondary | ICD-10-CM | POA: Diagnosis not present

## 2020-05-07 DIAGNOSIS — R627 Adult failure to thrive: Secondary | ICD-10-CM | POA: Diagnosis not present

## 2020-05-07 DIAGNOSIS — R531 Weakness: Secondary | ICD-10-CM | POA: Diagnosis not present

## 2020-05-07 DIAGNOSIS — M6281 Muscle weakness (generalized): Secondary | ICD-10-CM | POA: Diagnosis not present

## 2020-05-09 DIAGNOSIS — R531 Weakness: Secondary | ICD-10-CM | POA: Diagnosis not present

## 2020-05-09 DIAGNOSIS — M6281 Muscle weakness (generalized): Secondary | ICD-10-CM | POA: Diagnosis not present

## 2020-05-09 DIAGNOSIS — R627 Adult failure to thrive: Secondary | ICD-10-CM | POA: Diagnosis not present

## 2020-05-13 DIAGNOSIS — G894 Chronic pain syndrome: Secondary | ICD-10-CM | POA: Diagnosis not present

## 2020-05-13 DIAGNOSIS — F411 Generalized anxiety disorder: Secondary | ICD-10-CM | POA: Diagnosis not present

## 2020-05-13 DIAGNOSIS — F1120 Opioid dependence, uncomplicated: Secondary | ICD-10-CM | POA: Diagnosis not present

## 2020-05-13 DIAGNOSIS — R627 Adult failure to thrive: Secondary | ICD-10-CM | POA: Diagnosis not present

## 2020-05-15 DIAGNOSIS — F330 Major depressive disorder, recurrent, mild: Secondary | ICD-10-CM | POA: Diagnosis not present

## 2020-05-15 DIAGNOSIS — F411 Generalized anxiety disorder: Secondary | ICD-10-CM | POA: Diagnosis not present

## 2020-05-29 DIAGNOSIS — F418 Other specified anxiety disorders: Secondary | ICD-10-CM | POA: Diagnosis not present

## 2020-05-29 DIAGNOSIS — F329 Major depressive disorder, single episode, unspecified: Secondary | ICD-10-CM | POA: Diagnosis not present

## 2020-06-07 DIAGNOSIS — F330 Major depressive disorder, recurrent, mild: Secondary | ICD-10-CM | POA: Diagnosis not present

## 2020-06-07 DIAGNOSIS — R627 Adult failure to thrive: Secondary | ICD-10-CM | POA: Diagnosis not present

## 2020-06-07 DIAGNOSIS — F411 Generalized anxiety disorder: Secondary | ICD-10-CM | POA: Diagnosis not present

## 2020-06-07 DIAGNOSIS — G894 Chronic pain syndrome: Secondary | ICD-10-CM | POA: Diagnosis not present

## 2020-06-12 DIAGNOSIS — F329 Major depressive disorder, single episode, unspecified: Secondary | ICD-10-CM | POA: Diagnosis not present

## 2020-06-19 DIAGNOSIS — F418 Other specified anxiety disorders: Secondary | ICD-10-CM | POA: Diagnosis not present

## 2020-06-19 DIAGNOSIS — F329 Major depressive disorder, single episode, unspecified: Secondary | ICD-10-CM | POA: Diagnosis not present

## 2020-07-09 DIAGNOSIS — M6281 Muscle weakness (generalized): Secondary | ICD-10-CM | POA: Diagnosis not present

## 2020-07-10 DIAGNOSIS — M6281 Muscle weakness (generalized): Secondary | ICD-10-CM | POA: Diagnosis not present

## 2020-07-10 DIAGNOSIS — I959 Hypotension, unspecified: Secondary | ICD-10-CM | POA: Diagnosis not present

## 2020-07-10 DIAGNOSIS — R5381 Other malaise: Secondary | ICD-10-CM | POA: Diagnosis not present

## 2020-07-10 DIAGNOSIS — G894 Chronic pain syndrome: Secondary | ICD-10-CM | POA: Diagnosis not present

## 2020-07-10 DIAGNOSIS — F411 Generalized anxiety disorder: Secondary | ICD-10-CM | POA: Diagnosis not present

## 2020-07-11 DIAGNOSIS — M6281 Muscle weakness (generalized): Secondary | ICD-10-CM | POA: Diagnosis not present

## 2020-07-12 DIAGNOSIS — M6281 Muscle weakness (generalized): Secondary | ICD-10-CM | POA: Diagnosis not present

## 2020-07-15 DIAGNOSIS — M6281 Muscle weakness (generalized): Secondary | ICD-10-CM | POA: Diagnosis not present

## 2020-07-16 DIAGNOSIS — M6281 Muscle weakness (generalized): Secondary | ICD-10-CM | POA: Diagnosis not present

## 2020-07-17 DIAGNOSIS — F418 Other specified anxiety disorders: Secondary | ICD-10-CM | POA: Diagnosis not present

## 2020-07-17 DIAGNOSIS — M6281 Muscle weakness (generalized): Secondary | ICD-10-CM | POA: Diagnosis not present

## 2020-07-19 DIAGNOSIS — M6281 Muscle weakness (generalized): Secondary | ICD-10-CM | POA: Diagnosis not present

## 2020-07-25 DIAGNOSIS — G894 Chronic pain syndrome: Secondary | ICD-10-CM | POA: Diagnosis not present

## 2020-08-07 DIAGNOSIS — M810 Age-related osteoporosis without current pathological fracture: Secondary | ICD-10-CM | POA: Diagnosis not present

## 2020-08-07 DIAGNOSIS — R5381 Other malaise: Secondary | ICD-10-CM | POA: Diagnosis not present

## 2020-08-07 DIAGNOSIS — G894 Chronic pain syndrome: Secondary | ICD-10-CM | POA: Diagnosis not present

## 2020-08-07 DIAGNOSIS — F330 Major depressive disorder, recurrent, mild: Secondary | ICD-10-CM | POA: Diagnosis not present

## 2020-08-20 DIAGNOSIS — I10 Essential (primary) hypertension: Secondary | ICD-10-CM | POA: Diagnosis not present

## 2020-08-20 DIAGNOSIS — G894 Chronic pain syndrome: Secondary | ICD-10-CM | POA: Diagnosis not present

## 2020-08-20 DIAGNOSIS — R5381 Other malaise: Secondary | ICD-10-CM | POA: Diagnosis not present

## 2020-08-20 DIAGNOSIS — F411 Generalized anxiety disorder: Secondary | ICD-10-CM | POA: Diagnosis not present

## 2020-08-27 DIAGNOSIS — H2513 Age-related nuclear cataract, bilateral: Secondary | ICD-10-CM | POA: Diagnosis not present

## 2020-08-27 DIAGNOSIS — H18523 Epithelial (juvenile) corneal dystrophy, bilateral: Secondary | ICD-10-CM | POA: Diagnosis not present

## 2020-08-27 DIAGNOSIS — H02052 Trichiasis without entropian right lower eyelid: Secondary | ICD-10-CM | POA: Diagnosis not present

## 2020-08-27 DIAGNOSIS — H25043 Posterior subcapsular polar age-related cataract, bilateral: Secondary | ICD-10-CM | POA: Diagnosis not present

## 2020-09-05 DIAGNOSIS — S3210XA Unspecified fracture of sacrum, initial encounter for closed fracture: Secondary | ICD-10-CM | POA: Diagnosis not present

## 2020-09-05 DIAGNOSIS — S72114A Nondisplaced fracture of greater trochanter of right femur, initial encounter for closed fracture: Secondary | ICD-10-CM | POA: Diagnosis not present

## 2020-09-05 DIAGNOSIS — N8301 Follicular cyst of right ovary: Secondary | ICD-10-CM | POA: Diagnosis not present

## 2020-09-05 DIAGNOSIS — S32591A Other specified fracture of right pubis, initial encounter for closed fracture: Secondary | ICD-10-CM | POA: Diagnosis not present

## 2020-09-05 DIAGNOSIS — I10 Essential (primary) hypertension: Secondary | ICD-10-CM | POA: Diagnosis not present

## 2020-09-05 DIAGNOSIS — S0181XA Laceration without foreign body of other part of head, initial encounter: Secondary | ICD-10-CM | POA: Diagnosis not present

## 2020-09-05 DIAGNOSIS — K760 Fatty (change of) liver, not elsewhere classified: Secondary | ICD-10-CM | POA: Diagnosis not present

## 2020-09-05 DIAGNOSIS — R2681 Unsteadiness on feet: Secondary | ICD-10-CM | POA: Diagnosis not present

## 2020-09-05 DIAGNOSIS — M9701XA Periprosthetic fracture around internal prosthetic right hip joint, initial encounter: Secondary | ICD-10-CM | POA: Diagnosis not present

## 2020-09-05 DIAGNOSIS — A419 Sepsis, unspecified organism: Secondary | ICD-10-CM | POA: Diagnosis not present

## 2020-09-05 DIAGNOSIS — R52 Pain, unspecified: Secondary | ICD-10-CM | POA: Diagnosis not present

## 2020-09-05 DIAGNOSIS — M9702XA Periprosthetic fracture around internal prosthetic left hip joint, initial encounter: Secondary | ICD-10-CM | POA: Diagnosis not present

## 2020-09-05 DIAGNOSIS — Z7401 Bed confinement status: Secondary | ICD-10-CM | POA: Diagnosis not present

## 2020-09-05 DIAGNOSIS — R1909 Other intra-abdominal and pelvic swelling, mass and lump: Secondary | ICD-10-CM | POA: Diagnosis not present

## 2020-09-05 DIAGNOSIS — Z4789 Encounter for other orthopedic aftercare: Secondary | ICD-10-CM | POA: Diagnosis not present

## 2020-09-05 DIAGNOSIS — Z9181 History of falling: Secondary | ICD-10-CM | POA: Diagnosis not present

## 2020-09-05 DIAGNOSIS — Z20822 Contact with and (suspected) exposure to covid-19: Secondary | ICD-10-CM | POA: Diagnosis not present

## 2020-09-05 DIAGNOSIS — E46 Unspecified protein-calorie malnutrition: Secondary | ICD-10-CM | POA: Diagnosis not present

## 2020-09-05 DIAGNOSIS — R1312 Dysphagia, oropharyngeal phase: Secondary | ICD-10-CM | POA: Diagnosis not present

## 2020-09-05 DIAGNOSIS — M25551 Pain in right hip: Secondary | ICD-10-CM | POA: Diagnosis not present

## 2020-09-05 DIAGNOSIS — W19XXXA Unspecified fall, initial encounter: Secondary | ICD-10-CM | POA: Diagnosis not present

## 2020-09-05 DIAGNOSIS — S32501D Unspecified fracture of right pubis, subsequent encounter for fracture with routine healing: Secondary | ICD-10-CM | POA: Diagnosis not present

## 2020-09-05 DIAGNOSIS — D472 Monoclonal gammopathy: Secondary | ICD-10-CM | POA: Diagnosis not present

## 2020-09-05 DIAGNOSIS — R7881 Bacteremia: Secondary | ICD-10-CM | POA: Diagnosis not present

## 2020-09-05 DIAGNOSIS — M6281 Muscle weakness (generalized): Secondary | ICD-10-CM | POA: Diagnosis not present

## 2020-09-05 DIAGNOSIS — S0101XA Laceration without foreign body of scalp, initial encounter: Secondary | ICD-10-CM | POA: Diagnosis not present

## 2020-09-05 DIAGNOSIS — J849 Interstitial pulmonary disease, unspecified: Secondary | ICD-10-CM | POA: Diagnosis not present

## 2020-09-05 DIAGNOSIS — S32110D Nondisplaced Zone I fracture of sacrum, subsequent encounter for fracture with routine healing: Secondary | ICD-10-CM | POA: Diagnosis not present

## 2020-09-05 DIAGNOSIS — Z66 Do not resuscitate: Secondary | ICD-10-CM | POA: Diagnosis not present

## 2020-09-05 DIAGNOSIS — M9701XD Periprosthetic fracture around internal prosthetic right hip joint, subsequent encounter: Secondary | ICD-10-CM | POA: Diagnosis not present

## 2020-09-05 DIAGNOSIS — Z681 Body mass index (BMI) 19 or less, adult: Secondary | ICD-10-CM | POA: Diagnosis not present

## 2020-09-05 DIAGNOSIS — E871 Hypo-osmolality and hyponatremia: Secondary | ICD-10-CM | POA: Diagnosis not present

## 2020-09-05 DIAGNOSIS — S7291XA Unspecified fracture of right femur, initial encounter for closed fracture: Secondary | ICD-10-CM | POA: Diagnosis not present

## 2020-09-05 DIAGNOSIS — F17290 Nicotine dependence, other tobacco product, uncomplicated: Secondary | ICD-10-CM | POA: Diagnosis not present

## 2020-09-05 DIAGNOSIS — E872 Acidosis: Secondary | ICD-10-CM | POA: Diagnosis not present

## 2020-09-05 DIAGNOSIS — R0902 Hypoxemia: Secondary | ICD-10-CM | POA: Diagnosis not present

## 2020-09-05 DIAGNOSIS — M2550 Pain in unspecified joint: Secondary | ICD-10-CM | POA: Diagnosis not present

## 2020-09-05 DIAGNOSIS — M80851A Other osteoporosis with current pathological fracture, right femur, initial encounter for fracture: Secondary | ICD-10-CM | POA: Diagnosis not present

## 2020-09-05 DIAGNOSIS — Z043 Encounter for examination and observation following other accident: Secondary | ICD-10-CM | POA: Diagnosis not present

## 2020-09-05 DIAGNOSIS — R627 Adult failure to thrive: Secondary | ICD-10-CM | POA: Diagnosis not present

## 2020-09-05 DIAGNOSIS — R278 Other lack of coordination: Secondary | ICD-10-CM | POA: Diagnosis not present

## 2020-09-05 DIAGNOSIS — E876 Hypokalemia: Secondary | ICD-10-CM | POA: Diagnosis not present

## 2020-09-05 DIAGNOSIS — N838 Other noninflammatory disorders of ovary, fallopian tube and broad ligament: Secondary | ICD-10-CM | POA: Diagnosis not present

## 2020-09-05 DIAGNOSIS — R7989 Other specified abnormal findings of blood chemistry: Secondary | ICD-10-CM | POA: Diagnosis not present

## 2020-09-05 DIAGNOSIS — R64 Cachexia: Secondary | ICD-10-CM | POA: Diagnosis not present

## 2020-09-05 DIAGNOSIS — I959 Hypotension, unspecified: Secondary | ICD-10-CM | POA: Diagnosis not present

## 2020-09-05 DIAGNOSIS — D72829 Elevated white blood cell count, unspecified: Secondary | ICD-10-CM | POA: Diagnosis not present

## 2020-09-05 DIAGNOSIS — G8929 Other chronic pain: Secondary | ICD-10-CM | POA: Diagnosis not present

## 2020-09-05 DIAGNOSIS — J9601 Acute respiratory failure with hypoxia: Secondary | ICD-10-CM | POA: Diagnosis not present

## 2020-09-10 DIAGNOSIS — J9601 Acute respiratory failure with hypoxia: Secondary | ICD-10-CM | POA: Diagnosis not present

## 2020-09-10 DIAGNOSIS — R278 Other lack of coordination: Secondary | ICD-10-CM | POA: Diagnosis not present

## 2020-09-10 DIAGNOSIS — E871 Hypo-osmolality and hyponatremia: Secondary | ICD-10-CM | POA: Diagnosis not present

## 2020-09-10 DIAGNOSIS — S72001D Fracture of unspecified part of neck of right femur, subsequent encounter for closed fracture with routine healing: Secondary | ICD-10-CM | POA: Diagnosis not present

## 2020-09-10 DIAGNOSIS — I10 Essential (primary) hypertension: Secondary | ICD-10-CM | POA: Diagnosis not present

## 2020-09-10 DIAGNOSIS — R627 Adult failure to thrive: Secondary | ICD-10-CM | POA: Diagnosis not present

## 2020-09-10 DIAGNOSIS — S32501D Unspecified fracture of right pubis, subsequent encounter for fracture with routine healing: Secondary | ICD-10-CM | POA: Diagnosis not present

## 2020-09-10 DIAGNOSIS — R1909 Other intra-abdominal and pelvic swelling, mass and lump: Secondary | ICD-10-CM | POA: Diagnosis not present

## 2020-09-10 DIAGNOSIS — Z992 Dependence on renal dialysis: Secondary | ICD-10-CM | POA: Diagnosis not present

## 2020-09-10 DIAGNOSIS — M6281 Muscle weakness (generalized): Secondary | ICD-10-CM | POA: Diagnosis not present

## 2020-09-10 DIAGNOSIS — E872 Acidosis: Secondary | ICD-10-CM | POA: Diagnosis not present

## 2020-09-10 DIAGNOSIS — G8929 Other chronic pain: Secondary | ICD-10-CM | POA: Diagnosis not present

## 2020-09-10 DIAGNOSIS — Z4789 Encounter for other orthopedic aftercare: Secondary | ICD-10-CM | POA: Diagnosis not present

## 2020-09-10 DIAGNOSIS — R5381 Other malaise: Secondary | ICD-10-CM | POA: Diagnosis not present

## 2020-09-10 DIAGNOSIS — R2681 Unsteadiness on feet: Secondary | ICD-10-CM | POA: Diagnosis not present

## 2020-09-10 DIAGNOSIS — R1312 Dysphagia, oropharyngeal phase: Secondary | ICD-10-CM | POA: Diagnosis not present

## 2020-09-10 DIAGNOSIS — D649 Anemia, unspecified: Secondary | ICD-10-CM | POA: Diagnosis not present

## 2020-09-10 DIAGNOSIS — E876 Hypokalemia: Secondary | ICD-10-CM | POA: Diagnosis not present

## 2020-09-10 DIAGNOSIS — Z7401 Bed confinement status: Secondary | ICD-10-CM | POA: Diagnosis not present

## 2020-09-10 DIAGNOSIS — M9701XD Periprosthetic fracture around internal prosthetic right hip joint, subsequent encounter: Secondary | ICD-10-CM | POA: Diagnosis not present

## 2020-09-10 DIAGNOSIS — M9702XA Periprosthetic fracture around internal prosthetic left hip joint, initial encounter: Secondary | ICD-10-CM | POA: Diagnosis not present

## 2020-09-10 DIAGNOSIS — S32110D Nondisplaced Zone I fracture of sacrum, subsequent encounter for fracture with routine healing: Secondary | ICD-10-CM | POA: Diagnosis not present

## 2020-09-10 DIAGNOSIS — E861 Hypovolemia: Secondary | ICD-10-CM | POA: Diagnosis not present

## 2020-09-10 DIAGNOSIS — D72829 Elevated white blood cell count, unspecified: Secondary | ICD-10-CM | POA: Diagnosis not present

## 2020-09-10 DIAGNOSIS — Z9181 History of falling: Secondary | ICD-10-CM | POA: Diagnosis not present

## 2020-09-10 DIAGNOSIS — I959 Hypotension, unspecified: Secondary | ICD-10-CM | POA: Diagnosis not present

## 2020-09-10 DIAGNOSIS — M2550 Pain in unspecified joint: Secondary | ICD-10-CM | POA: Diagnosis not present

## 2020-09-11 DIAGNOSIS — E861 Hypovolemia: Secondary | ICD-10-CM | POA: Diagnosis not present

## 2020-09-11 DIAGNOSIS — E871 Hypo-osmolality and hyponatremia: Secondary | ICD-10-CM | POA: Diagnosis not present

## 2020-09-11 DIAGNOSIS — I959 Hypotension, unspecified: Secondary | ICD-10-CM | POA: Diagnosis not present

## 2020-09-11 DIAGNOSIS — E872 Acidosis: Secondary | ICD-10-CM | POA: Diagnosis not present

## 2020-09-13 DIAGNOSIS — E871 Hypo-osmolality and hyponatremia: Secondary | ICD-10-CM | POA: Diagnosis not present

## 2020-09-13 DIAGNOSIS — E861 Hypovolemia: Secondary | ICD-10-CM | POA: Diagnosis not present

## 2020-09-13 DIAGNOSIS — R5381 Other malaise: Secondary | ICD-10-CM | POA: Diagnosis not present

## 2020-09-13 DIAGNOSIS — S72001D Fracture of unspecified part of neck of right femur, subsequent encounter for closed fracture with routine healing: Secondary | ICD-10-CM | POA: Diagnosis not present

## 2020-09-17 DIAGNOSIS — L97509 Non-pressure chronic ulcer of other part of unspecified foot with unspecified severity: Secondary | ICD-10-CM | POA: Diagnosis not present

## 2020-09-20 DIAGNOSIS — M2550 Pain in unspecified joint: Secondary | ICD-10-CM | POA: Diagnosis not present

## 2020-09-20 DIAGNOSIS — Z7401 Bed confinement status: Secondary | ICD-10-CM | POA: Diagnosis not present

## 2020-09-20 DIAGNOSIS — R1900 Intra-abdominal and pelvic swelling, mass and lump, unspecified site: Secondary | ICD-10-CM | POA: Diagnosis not present

## 2020-09-20 DIAGNOSIS — R0902 Hypoxemia: Secondary | ICD-10-CM | POA: Diagnosis not present

## 2020-09-20 DIAGNOSIS — D649 Anemia, unspecified: Secondary | ICD-10-CM | POA: Diagnosis not present

## 2020-09-20 DIAGNOSIS — I959 Hypotension, unspecified: Secondary | ICD-10-CM | POA: Diagnosis not present

## 2020-09-20 DIAGNOSIS — G894 Chronic pain syndrome: Secondary | ICD-10-CM | POA: Diagnosis not present

## 2020-09-20 DIAGNOSIS — N9489 Other specified conditions associated with female genital organs and menstrual cycle: Secondary | ICD-10-CM | POA: Diagnosis not present

## 2020-09-20 DIAGNOSIS — R5381 Other malaise: Secondary | ICD-10-CM | POA: Diagnosis not present

## 2020-09-20 DIAGNOSIS — R627 Adult failure to thrive: Secondary | ICD-10-CM | POA: Diagnosis not present

## 2020-09-20 DIAGNOSIS — W19XXXA Unspecified fall, initial encounter: Secondary | ICD-10-CM | POA: Diagnosis not present

## 2020-09-23 DIAGNOSIS — R768 Other specified abnormal immunological findings in serum: Secondary | ICD-10-CM | POA: Diagnosis not present

## 2020-09-23 DIAGNOSIS — D472 Monoclonal gammopathy: Secondary | ICD-10-CM | POA: Diagnosis not present

## 2020-09-24 DIAGNOSIS — I10 Essential (primary) hypertension: Secondary | ICD-10-CM | POA: Diagnosis not present

## 2020-09-24 DIAGNOSIS — G8929 Other chronic pain: Secondary | ICD-10-CM | POA: Diagnosis not present

## 2020-09-24 DIAGNOSIS — D649 Anemia, unspecified: Secondary | ICD-10-CM | POA: Diagnosis not present

## 2020-09-24 DIAGNOSIS — D519 Vitamin B12 deficiency anemia, unspecified: Secondary | ICD-10-CM | POA: Diagnosis not present

## 2020-09-25 DIAGNOSIS — R768 Other specified abnormal immunological findings in serum: Secondary | ICD-10-CM | POA: Diagnosis not present

## 2020-09-30 DIAGNOSIS — E569 Vitamin deficiency, unspecified: Secondary | ICD-10-CM | POA: Diagnosis not present

## 2020-09-30 DIAGNOSIS — D519 Vitamin B12 deficiency anemia, unspecified: Secondary | ICD-10-CM | POA: Diagnosis not present

## 2020-09-30 DIAGNOSIS — D649 Anemia, unspecified: Secondary | ICD-10-CM | POA: Diagnosis not present

## 2020-09-30 DIAGNOSIS — E039 Hypothyroidism, unspecified: Secondary | ICD-10-CM | POA: Diagnosis not present

## 2020-10-01 DIAGNOSIS — R195 Other fecal abnormalities: Secondary | ICD-10-CM | POA: Diagnosis not present

## 2020-10-01 DIAGNOSIS — G894 Chronic pain syndrome: Secondary | ICD-10-CM | POA: Diagnosis not present

## 2020-10-01 DIAGNOSIS — R627 Adult failure to thrive: Secondary | ICD-10-CM | POA: Diagnosis not present

## 2020-10-01 DIAGNOSIS — R5381 Other malaise: Secondary | ICD-10-CM | POA: Diagnosis not present

## 2020-10-02 DIAGNOSIS — E569 Vitamin deficiency, unspecified: Secondary | ICD-10-CM | POA: Diagnosis not present

## 2020-10-02 DIAGNOSIS — D649 Anemia, unspecified: Secondary | ICD-10-CM | POA: Diagnosis not present

## 2020-10-02 DIAGNOSIS — D519 Vitamin B12 deficiency anemia, unspecified: Secondary | ICD-10-CM | POA: Diagnosis not present

## 2020-10-02 DIAGNOSIS — E039 Hypothyroidism, unspecified: Secondary | ICD-10-CM | POA: Diagnosis not present

## 2020-10-17 DIAGNOSIS — L89133 Pressure ulcer of right lower back, stage 3: Secondary | ICD-10-CM | POA: Diagnosis not present

## 2020-10-18 DIAGNOSIS — R5381 Other malaise: Secondary | ICD-10-CM | POA: Diagnosis not present

## 2020-10-18 DIAGNOSIS — M810 Age-related osteoporosis without current pathological fracture: Secondary | ICD-10-CM | POA: Diagnosis not present

## 2020-10-18 DIAGNOSIS — R195 Other fecal abnormalities: Secondary | ICD-10-CM | POA: Diagnosis not present

## 2020-10-18 DIAGNOSIS — G894 Chronic pain syndrome: Secondary | ICD-10-CM | POA: Diagnosis not present

## 2020-10-19 DIAGNOSIS — S72001D Fracture of unspecified part of neck of right femur, subsequent encounter for closed fracture with routine healing: Secondary | ICD-10-CM | POA: Diagnosis not present

## 2020-10-19 DIAGNOSIS — S82891A Other fracture of right lower leg, initial encounter for closed fracture: Secondary | ICD-10-CM | POA: Diagnosis not present

## 2020-10-19 DIAGNOSIS — R5381 Other malaise: Secondary | ICD-10-CM | POA: Diagnosis not present

## 2020-10-19 DIAGNOSIS — R195 Other fecal abnormalities: Secondary | ICD-10-CM | POA: Diagnosis not present

## 2020-10-21 DIAGNOSIS — D472 Monoclonal gammopathy: Secondary | ICD-10-CM | POA: Diagnosis not present

## 2020-10-23 DIAGNOSIS — I10 Essential (primary) hypertension: Secondary | ICD-10-CM | POA: Diagnosis not present

## 2020-10-23 DIAGNOSIS — E876 Hypokalemia: Secondary | ICD-10-CM | POA: Diagnosis not present

## 2020-10-24 DIAGNOSIS — Z992 Dependence on renal dialysis: Secondary | ICD-10-CM | POA: Diagnosis not present

## 2020-10-24 DIAGNOSIS — I10 Essential (primary) hypertension: Secondary | ICD-10-CM | POA: Diagnosis not present

## 2020-11-12 DIAGNOSIS — L89133 Pressure ulcer of right lower back, stage 3: Secondary | ICD-10-CM | POA: Diagnosis not present

## 2020-11-15 DIAGNOSIS — E039 Hypothyroidism, unspecified: Secondary | ICD-10-CM | POA: Diagnosis not present

## 2020-11-15 DIAGNOSIS — G894 Chronic pain syndrome: Secondary | ICD-10-CM | POA: Diagnosis not present

## 2020-11-15 DIAGNOSIS — R5381 Other malaise: Secondary | ICD-10-CM | POA: Diagnosis not present

## 2020-11-15 DIAGNOSIS — L8990 Pressure ulcer of unspecified site, unspecified stage: Secondary | ICD-10-CM | POA: Diagnosis not present

## 2020-11-25 DIAGNOSIS — F330 Major depressive disorder, recurrent, mild: Secondary | ICD-10-CM | POA: Diagnosis not present

## 2020-11-25 DIAGNOSIS — F418 Other specified anxiety disorders: Secondary | ICD-10-CM | POA: Diagnosis not present

## 2020-11-25 DIAGNOSIS — G894 Chronic pain syndrome: Secondary | ICD-10-CM | POA: Diagnosis not present

## 2020-11-25 DIAGNOSIS — R5381 Other malaise: Secondary | ICD-10-CM | POA: Diagnosis not present

## 2021-01-31 DEATH — deceased
